# Patient Record
Sex: Male | Born: 1955 | Race: White | Hispanic: No | Marital: Married | Smoking: Never smoker
Health system: Southern US, Community
[De-identification: ages and names within clinical notes are randomized; demographics above are authoritative.]

## PROBLEM LIST (undated history)

## (undated) DIAGNOSIS — Z95828 Presence of other vascular implants and grafts: Secondary | ICD-10-CM

## (undated) DIAGNOSIS — R011 Cardiac murmur, unspecified: Secondary | ICD-10-CM

## (undated) DIAGNOSIS — E785 Hyperlipidemia, unspecified: Secondary | ICD-10-CM

## (undated) DIAGNOSIS — I10 Essential (primary) hypertension: Secondary | ICD-10-CM

## (undated) DIAGNOSIS — C801 Malignant (primary) neoplasm, unspecified: Secondary | ICD-10-CM

## (undated) HISTORY — DX: Essential (primary) hypertension: I10

## (undated) HISTORY — DX: Cardiac murmur, unspecified: R01.1

## (undated) HISTORY — DX: Hyperlipidemia, unspecified: E78.5

---

## 2004-01-03 ENCOUNTER — Encounter: Admission: RE | Admit: 2004-01-03 | Discharge: 2004-01-03 | Payer: Self-pay | Admitting: General Surgery

## 2011-10-28 ENCOUNTER — Other Ambulatory Visit: Payer: Self-pay | Admitting: Physician Assistant

## 2012-06-06 ENCOUNTER — Other Ambulatory Visit: Payer: Self-pay | Admitting: Physician Assistant

## 2012-06-06 NOTE — Telephone Encounter (Signed)
Please pull paper chart.  

## 2012-06-07 NOTE — Telephone Encounter (Signed)
No paper chart °

## 2015-08-31 ENCOUNTER — Encounter: Payer: Self-pay | Admitting: Family

## 2019-10-11 NOTE — Progress Notes (Signed)
Patient referred by Berkley Harvey, NP for murmur and shortness of breath  Subjective:   Donald Wade, male    DOB: [birth year], 64 y.o.   MRN: [id number]   Chief Complaint  Patient presents with  . Shortness of Breath  . New Patient (Initial Visit)     HPI  64 y.o. Caucasian male with hypertension, referred for evaluation of murmur or shortness of breath.  Patient has had progressive symptoms of exertional dyspnea since summer 2020.  He owns 1/2 acre land, which she takes care of himself.  He started experiencing the symptoms while working in the yard in summer 2020, and have progressed since.  He denies any chest pain or chest tightness.  He does endorse exertional dyspnea symptoms.  He has been on Tribenzor, along with additional amlodipine for his blood pressure control.  Amlodipine was recently stopped due to low blood pressure.  Evidence, his blood pressure has been ranging greater than 446 systolic.  On a separate note, patient also complains of pain in his left calf on exertion, improved with rest.  He recently underwent ultrasound of his lower extremity vein that did not show any DVT.  He is lifetime non-smoker, nondiabetic.  He does have family history of pulmonary artery disease, with his maternal grandfather dying at the age of 15.  His mother has had atrial fibrillation.  During the visit today, while discussing possible coronary artery disease and peripheral artery disease, patient suddenly felt overwhelmed and lightheaded.  Symptoms improved after laying him supine for 5 minutes.  Blood pressure normalized.   Past Medical History:  Diagnosis Date  . Heart murmur   . Hyperlipidemia   . Hypertension      Past Surgical History:  Procedure Laterality Date  . APPENDECTOMY  1980     Social History   Tobacco Use  Smoking Status Never Smoker  Smokeless Tobacco Never Used    Social History   Substance and Sexual Activity  Alcohol Use Yes   Comment: 2  beers daily     Family History  Problem Relation Age of Onset  . Heart disease Mother   . Atrial fibrillation Mother      Current Outpatient Medications on File Prior to Visit  Medication Sig Dispense Refill  . aspirin 81 MG EC tablet Take 1 tablet by mouth daily.    Marland Kitchen atorvastatin (LIPITOR) 10 MG tablet Take 1 tablet by mouth daily.    Marland Kitchen buPROPion (WELLBUTRIN XL) 150 MG 24 hr tablet Take 1 tablet by mouth daily.    Marland Kitchen EPINEPHrine 0.3 mg/0.3 mL IJ SOAJ injection Inject into the skin.    . fexofenadine (ALLEGRA) 180 MG tablet Take by mouth daily as needed.    . Multiple Vitamin (MULTI-VITAMIN) tablet Take 1 tablet by mouth daily.    . niacin 100 MG tablet Take 1 tablet by mouth daily as needed.    . Olmesartan-amLODIPine-HCTZ 40-5-12.5 MG TABS Take 1 tablet by mouth daily.    . Omega-3 1000 MG CAPS Take 1 tablet by mouth.    . vitamin B-12 (CYANOCOBALAMIN) 1000 MCG tablet Take 1,000 mcg by mouth daily.     No current facility-administered medications on file prior to visit.    Cardiovascular and other pertinent studies:  EKG: 10/01/2019: Sinus rhythm 80 bpm.  Normal EKG.  Recent labs: 10/01/2019: Glucose 120, BUN/Cr 12/0.92. EGFR 89. Na/K 135/4.3. Rest of the CMP normal  10/01/2019: HbA1C 5.4% Chol 244, TG 312, HDL 65, LDL  117  06/28/2019: H/H 14.5/41.5. MCV 94.6. Platelets 249   Review of Systems  Cardiovascular: Positive for claudication and dyspnea on exertion. Negative for chest pain, leg swelling, palpitations and syncope.        Vitals:   10/13/19 0859 10/13/19 0903  BP: (!) 161/92 (!) 153/93  Pulse: 95 98  Temp: 98.4 F (36.9 C)   SpO2: 99%     Body mass index is 28.26 kg/m. Filed Weights   10/13/19 0859  Weight: 208 lb 6.4 oz (94.5 kg)    Objective:   Physical Exam  Constitutional: He appears well-developed and well-nourished.  Neck: No JVD present.  Cardiovascular: Normal rate, regular rhythm and intact distal pulses.  Murmur heard.   Harsh midsystolic murmur is present with a grade of 2/6 at the upper right sternal border radiating to the neck. Pulses:      Dorsalis pedis pulses are 2+ on the right side and 1+ on the left side.       Posterior tibial pulses are 0 on the right side and 0 on the left side.  Pulmonary/Chest: Effort normal and breath sounds normal. He has no wheezes. He has no rales.  Musculoskeletal:        General: No edema.  Nursing note and vitals reviewed.        Assessment & Recommendations:   64 y.o. Caucasian male with hypertension, exertional dyspnea, claudication.  Exertional dyspnea: Physical exam unremarkable for heart failure.  He does have murmur, likely mild to moderate aortic stenosis, but does not explain his symptoms.  Symptoms could be angina equivalent.  I will obtain Lexiscan/walking stress test.  In addition, I will also obtain calcium score for risk stratification.  Hypertension: Continue Tribenzor.  Instead of additional amlodipine, I added metoprolol succinate 25 mg daily.  This could also work as antianginal agent.  Continue aspirin, Lipitor.  Claudication: Continue risk factor modification.  Will check baseline ABI.  Further recommendations after above testing    Thank you for referring the patient to [place]. Please feel free to contact with any questions.  Nigel Mormon, MD [facility] Pager: [phone] Office: [phone]

## 2019-10-13 ENCOUNTER — Other Ambulatory Visit: Payer: Self-pay

## 2019-10-13 ENCOUNTER — Encounter: Payer: Self-pay | Admitting: Cardiology

## 2019-10-13 ENCOUNTER — Ambulatory Visit: Payer: 59 | Admitting: Cardiology

## 2019-10-13 VITALS — BP 160/92 | HR 86 | Temp 98.4°F | Ht 72.0 in | Wt 208.4 lb

## 2019-10-13 DIAGNOSIS — Z8249 Family history of ischemic heart disease and other diseases of the circulatory system: Secondary | ICD-10-CM | POA: Diagnosis not present

## 2019-10-13 DIAGNOSIS — R0609 Other forms of dyspnea: Secondary | ICD-10-CM

## 2019-10-13 DIAGNOSIS — I739 Peripheral vascular disease, unspecified: Secondary | ICD-10-CM | POA: Insufficient documentation

## 2019-10-13 DIAGNOSIS — I10 Essential (primary) hypertension: Secondary | ICD-10-CM

## 2019-10-13 MED ORDER — METOPROLOL SUCCINATE ER 25 MG PO TB24: 25.0000 mg | ORAL_TABLET | Freq: Every day | ORAL | 1 refills | Status: DC

## 2019-10-19 ENCOUNTER — Ambulatory Visit: Payer: 59

## 2019-10-19 ENCOUNTER — Other Ambulatory Visit: Payer: Self-pay

## 2019-10-19 DIAGNOSIS — I739 Peripheral vascular disease, unspecified: Secondary | ICD-10-CM

## 2019-10-19 DIAGNOSIS — I10 Essential (primary) hypertension: Secondary | ICD-10-CM

## 2019-10-20 ENCOUNTER — Ambulatory Visit: Payer: 59

## 2019-10-20 ENCOUNTER — Other Ambulatory Visit: Payer: Self-pay

## 2019-10-20 DIAGNOSIS — R0609 Other forms of dyspnea: Secondary | ICD-10-CM

## 2019-11-12 ENCOUNTER — Other Ambulatory Visit: Payer: Self-pay

## 2019-11-12 ENCOUNTER — Telehealth: Payer: 59 | Admitting: Cardiology

## 2019-11-12 VITALS — BP 130/80 | HR 80

## 2019-11-12 DIAGNOSIS — R931 Abnormal findings on diagnostic imaging of heart and coronary circulation: Secondary | ICD-10-CM

## 2019-11-12 DIAGNOSIS — I10 Essential (primary) hypertension: Secondary | ICD-10-CM

## 2019-11-12 DIAGNOSIS — I739 Peripheral vascular disease, unspecified: Secondary | ICD-10-CM

## 2019-11-12 DIAGNOSIS — Z8249 Family history of ischemic heart disease and other diseases of the circulatory system: Secondary | ICD-10-CM

## 2019-11-12 DIAGNOSIS — R0609 Other forms of dyspnea: Secondary | ICD-10-CM

## 2019-11-12 MED ORDER — ATORVASTATIN CALCIUM 40 MG PO TABS: 40.0000 mg | ORAL_TABLET | Freq: Every day | ORAL | 3 refills | Status: DC

## 2019-11-12 NOTE — Progress Notes (Addendum)
Patient referred by Berkley Harvey, NP for murmur and shortness of breath  Subjective:   Donald Wade, male    DOB: February 24, 1956, 64 y.o.   MRN: [id number]   I connected with the patient on 11/12/2019 by a video enabled telemedicine application and verified that I am speaking with the correct person using two identifiers.     I discussed the limitations of evaluation and management by telemedicine and the availability of in person appointments. The patient expressed understanding and agreed to proceed.   This visit type was conducted due to national recommendations for restrictions regarding the COVID-19 Pandemic (e.g. social distancing).  This format is felt to be most appropriate for this patient at this time.  All issues noted in this document were discussed and addressed.  No physical exam was performed (except for noted visual exam findings with Tele health visits).  The patient has consented to conduct a Tele health visit and understands insurance will be billed.    Chief Complaint  Patient presents with  . Shortness of Breath     HPI  64 y.o. Caucasian male with hypertension, exertional dyspnea, claudication.  Work-up showed normal EF, mild AI, mild TR, RVSP 28 mmHg.  Mildly decreased perfusion in both lower extremities, ABI 0.8 on the right, 0.88 on left.  CT cardiac scoring showed elevated calcium score of 385, 37 left main, 346 in LAD, 2 in RCA, 0 in left circumflex.  Surprisingly, his nuclear stress test did not show any ischemia.  Patient continues to have significant shortness of breath with more than usual activity. He loves to work outside in the yard. However, his ability to do so is limited due to his shortness of breath. Again, he denies any chest pain.  Initial consultation HPI 10/13/2019: Patient has had progressive symptoms of exertional dyspnea since summer 2020.  He owns 1/2 acre land, which he takes care of himself.  He started experiencing the symptoms while  working in the yard in summer 2020, and have progressed since.  He denies any chest pain or chest tightness.  He does endorse exertional dyspnea symptoms.  He has been on Tribenzor, along with additional amlodipine for his blood pressure control.  Amlodipine was recently stopped due to low blood pressure.  Evidence, his blood pressure has been ranging greater than 165 systolic.  On a separate note, patient also complains of pain in his left calf on exertion, improved with rest.  He recently underwent ultrasound of his lower extremity vein that did not show any DVT.  He is lifetime non-smoker, nondiabetic.  He does have family history of pulmonary artery disease, with his maternal grandfather dying at the age of 63.  His mother has had atrial fibrillation.  During the visit today, while discussing possible coronary artery disease and peripheral artery disease, patient suddenly felt overwhelmed and lightheaded.  Symptoms improved after laying him supine for 5 minutes.  Blood pressure normalized.    Current Outpatient Medications on File Prior to Visit  Medication Sig Dispense Refill  . aspirin 81 MG EC tablet Take 1 tablet by mouth daily.    Marland Kitchen atorvastatin (LIPITOR) 10 MG tablet Take 1 tablet by mouth daily.    Marland Kitchen buPROPion (WELLBUTRIN XL) 150 MG 24 hr tablet Take 1 tablet by mouth daily.    Marland Kitchen EPINEPHrine 0.3 mg/0.3 mL IJ SOAJ injection Inject into the skin.    . fexofenadine (ALLEGRA) 180 MG tablet Take by mouth daily as needed.    Marland Kitchen  metoprolol succinate (TOPROL XL) 25 MG 24 hr tablet Take 1 tablet (25 mg total) by mouth daily. 30 tablet 1  . Multiple Vitamin (MULTI-VITAMIN) tablet Take 1 tablet by mouth daily.    . niacin 100 MG tablet Take 1 tablet by mouth daily as needed.    . Olmesartan-amLODIPine-HCTZ 40-5-12.5 MG TABS Take 1 tablet by mouth daily.    . Omega-3 1000 MG CAPS Take 1 tablet by mouth.    . vitamin B-12 (CYANOCOBALAMIN) 1000 MCG tablet Take 1,000 mcg by mouth daily.     No  current facility-administered medications on file prior to visit.    Cardiovascular and other pertinent studies:  Lexiscan (Walking with Jaci Carrel) Sestamibi Stress Test 10/20/2019: Nondiagnostic ECG stress. LV is minimally dilated with LV end diastolic volume of 509 mL.  Myocardial perfusion is normal. Stress LV EF: 50%.  No previous exam available for comparison. Low risk study.   Echocardiogram 10/19/2019:  Normal left ventricular systolic function, LVEF 55 to 60%.  Moderate concentric hypertrophy.  No obvious regional wall motion abnormalities.  Normal diastolic filling pressures.  Left atrial dilatation.  Mild aortic regurgitation.  Mild tricuspid regurgitation.  No pulmonary hypertension, estimated RVSP 28 mmHg.  CT Cardiac scoring 10/19/2019: - Total Calcium Score: 385. -- Left Main: 37. -- Right Coronary: 2 -- Left Anterior Descending: 346. -- Circumflex: 0.  ABI 10/12/2019:  This exam reveals mildly decreased perfusion of the right lower extremity,  noted at the anterior tibial and post tibial artery level (ABI 0.80) with  moderately abnormal waveform at right ankle). This exam reveals mildly  decreased perfusion of the left lower extremity, noted at the post tibial  artery level (ABI 0.88) with mildly abnormal waveform at left ankle.   EKG: 10/01/2019: Sinus rhythm 80 bpm.  Normal EKG.  Recent labs: 10/01/2019: Glucose 120, BUN/Cr 12/0.92. EGFR 89. Na/K 135/4.3. Rest of the CMP normal  10/01/2019: HbA1C 5.4% Chol 244, TG 312, HDL 65, LDL 117  06/28/2019: H/H 14.5/41.5. MCV 94.6. Platelets 249   Review of Systems  Cardiovascular: Positive for claudication and dyspnea on exertion. Negative for chest pain, leg swelling, palpitations and syncope.        Vitals:   11/12/19 1512  BP: 130/80  Pulse: 80     Objective:   Physical Exam  Constitutional: He is oriented to person, place, and time. He appears well-developed and well-nourished. No distress.    Pulmonary/Chest: Effort normal.  Neurological: He is alert and oriented to person, place, and time.  Psychiatric: He has a normal mood and affect.  Nursing note and vitals reviewed.        Assessment & Recommendations:   63 y.o. Caucasian male with hypertension, exertional dyspnea, claudication.  Exertional dyspnea: Echocardiogram with normal EF. Mild AI, mild TR. Stress test did not show ischemia. However, he did have hypotensive response on exercise. He has significant calcium in left main (37) and LAD (346). His symptoms are still concerning for angina equivalent. His symptoms have remained persistent on medical therapy. Given high clinical suspicion, I recommend coronary angiogram, with possibility of intervention.  Schedule for cardiac catheterization, and possible angioplasty. We discussed regarding risks, benefits, alternatives to this including stress testing, CTA and continued medical therapy. Patient wants to proceed. Understands <1-2% risk of death, stroke, MI, urgent CABG, bleeding, infection, renal failure but not limited to these.  Continue current medical treatment with Aspirin, increase lipitor to 40 mg. Continue Tribenzor, metoprolol.   Hypertension: Well controlled.  Claudication:  Mildly reduced ABI. Continue aggressive risk factor modification as above.   Nigel Mormon, MD [facility] Pager: [phone] Office: [phone]

## 2019-11-18 LAB — CBC
Hematocrit: 39 % (ref 37.5–51.0)
Hemoglobin: 13.5 g/dL (ref 13.0–17.7)
MCH: 33.3 pg — ABNORMAL HIGH (ref 26.6–33.0)
MCHC: 34.6 g/dL (ref 31.5–35.7)
MCV: 96 fL (ref 79–97)
Platelets: 259 10*3/uL (ref 150–450)
RBC: 4.05 x10E6/uL — ABNORMAL LOW (ref 4.14–5.80)
RDW: 12 % (ref 11.6–15.4)
WBC: 6.5 10*3/uL (ref 3.4–10.8)

## 2019-11-19 ENCOUNTER — Other Ambulatory Visit (HOSPITAL_COMMUNITY)
Admission: RE | Admit: 2019-11-19 | Discharge: 2019-11-19 | Disposition: A | Discharge status: Home / Self Care | Payer: 59 | Source: Ambulatory Visit | Attending: Cardiology | Admitting: Cardiology

## 2019-11-19 DIAGNOSIS — Z01812 Encounter for preprocedural laboratory examination: Secondary | ICD-10-CM | POA: Insufficient documentation

## 2019-11-19 DIAGNOSIS — Z20822 Contact with and (suspected) exposure to covid-19: Secondary | ICD-10-CM | POA: Diagnosis not present

## 2019-11-19 LAB — SARS CORONAVIRUS 2 (TAT 6-24 HRS): SARS Coronavirus 2: NEGATIVE

## 2019-11-23 ENCOUNTER — Other Ambulatory Visit: Payer: Self-pay

## 2019-11-23 ENCOUNTER — Ambulatory Visit (HOSPITAL_COMMUNITY)
Admission: RE | Admit: 2019-11-23 | Discharge: 2019-11-23 | Disposition: A | Discharge status: Home / Self Care | Payer: No Typology Code available for payment source | Attending: Cardiology | Admitting: Cardiology

## 2019-11-23 ENCOUNTER — Encounter (HOSPITAL_COMMUNITY)
Admission: RE | Disposition: A | Discharge status: Home / Self Care | Payer: Self-pay | Source: Home / Self Care | Attending: Cardiology

## 2019-11-23 DIAGNOSIS — I739 Peripheral vascular disease, unspecified: Secondary | ICD-10-CM | POA: Diagnosis not present

## 2019-11-23 DIAGNOSIS — I10 Essential (primary) hypertension: Secondary | ICD-10-CM | POA: Diagnosis not present

## 2019-11-23 DIAGNOSIS — Z79899 Other long term (current) drug therapy: Secondary | ICD-10-CM | POA: Insufficient documentation

## 2019-11-23 DIAGNOSIS — Z7982 Long term (current) use of aspirin: Secondary | ICD-10-CM | POA: Diagnosis not present

## 2019-11-23 DIAGNOSIS — R0609 Other forms of dyspnea: Secondary | ICD-10-CM | POA: Diagnosis not present

## 2019-11-23 DIAGNOSIS — R931 Abnormal findings on diagnostic imaging of heart and coronary circulation: Secondary | ICD-10-CM | POA: Diagnosis present

## 2019-11-23 HISTORY — PX: LEFT HEART CATH AND CORONARY ANGIOGRAPHY: CATH118249

## 2019-11-23 LAB — BASIC METABOLIC PANEL
Anion gap: 14 (ref 5–15)
BUN: 10 mg/dL (ref 8–23)
CO2: 24 mmol/L (ref 22–32)
Calcium: 9.5 mg/dL (ref 8.9–10.3)
Chloride: 96 mmol/L — ABNORMAL LOW (ref 98–111)
Creatinine, Ser: 0.87 mg/dL (ref 0.61–1.24)
GFR calc Af Amer: >60 mL/min (ref >60)
GFR calc non Af Amer: >60 mL/min (ref >60)
Glucose, Bld: 106 mg/dL — ABNORMAL HIGH (ref 70–99)
Potassium: 3.7 mmol/L (ref 3.5–5.1)
Sodium: 134 mmol/L — ABNORMAL LOW (ref 135–145)

## 2019-11-23 SURGERY — LEFT HEART CATH AND CORONARY ANGIOGRAPHY
Anesthesia: LOCAL

## 2019-11-23 MED ORDER — HEPARIN (PORCINE) IN NACL 1000-0.9 UT/500ML-% IV SOLN
INTRAVENOUS | Status: DC | PRN
  Administered 2019-11-23 (×2): 500 mL

## 2019-11-23 MED ORDER — SODIUM CHLORIDE 0.9 % IV SOLN: 250.0000 mL | INTRAVENOUS | Status: DC | PRN

## 2019-11-23 MED ORDER — SODIUM CHLORIDE 0.9% FLUSH: 3.0000 mL | Freq: Two times a day (BID) | INTRAVENOUS | Status: DC

## 2019-11-23 MED ORDER — LABETALOL HCL 5 MG/ML IV SOLN: 10.0000 mg | INTRAVENOUS | Status: DC | PRN

## 2019-11-23 MED ORDER — SODIUM CHLORIDE 0.9 % WEIGHT BASED INFUSION: 1.0000 mL/kg/h | INTRAVENOUS | Status: DC

## 2019-11-23 MED ORDER — LIDOCAINE HCL (PF) 1 % IJ SOLN
INTRAMUSCULAR | Status: DC | PRN
  Administered 2019-11-23: 2 mL

## 2019-11-23 MED ORDER — SODIUM CHLORIDE 0.9 % IV SOLN: INTRAVENOUS | Status: AC

## 2019-11-23 MED ORDER — HEPARIN SODIUM (PORCINE) 1000 UNIT/ML IJ SOLN
INTRAMUSCULAR | Status: DC | PRN
  Administered 2019-11-23: 5000 [IU] via INTRAVENOUS

## 2019-11-23 MED ORDER — FENTANYL CITRATE (PF) 100 MCG/2ML IJ SOLN
INTRAMUSCULAR | Status: DC | PRN
  Administered 2019-11-23: 50 ug via INTRAVENOUS

## 2019-11-23 MED ORDER — SODIUM CHLORIDE 0.9% FLUSH: 3.0000 mL | INTRAVENOUS | Status: DC | PRN

## 2019-11-23 MED ORDER — VERAPAMIL HCL 2.5 MG/ML IV SOLN
INTRAVENOUS | Status: AC
  Filled 2019-11-23: qty 2

## 2019-11-23 MED ORDER — HEPARIN (PORCINE) IN NACL 1000-0.9 UT/500ML-% IV SOLN
INTRAVENOUS | Status: AC
  Filled 2019-11-23: qty 1000

## 2019-11-23 MED ORDER — MIDAZOLAM HCL 2 MG/2ML IJ SOLN
INTRAMUSCULAR | Status: AC
  Filled 2019-11-23: qty 2

## 2019-11-23 MED ORDER — MIDAZOLAM HCL 2 MG/2ML IJ SOLN
INTRAMUSCULAR | Status: DC | PRN
  Administered 2019-11-23: 2 mg via INTRAVENOUS

## 2019-11-23 MED ORDER — LIDOCAINE HCL (PF) 1 % IJ SOLN
INTRAMUSCULAR | Status: AC
  Filled 2019-11-23: qty 30

## 2019-11-23 MED ORDER — ASPIRIN 81 MG PO CHEW: 81.0000 mg | CHEWABLE_TABLET | ORAL | Status: DC

## 2019-11-23 MED ORDER — ONDANSETRON HCL 4 MG/2ML IJ SOLN: 4.0000 mg | Freq: Four times a day (QID) | INTRAMUSCULAR | Status: DC | PRN

## 2019-11-23 MED ORDER — HEPARIN SODIUM (PORCINE) 1000 UNIT/ML IJ SOLN
INTRAMUSCULAR | Status: AC
  Filled 2019-11-23: qty 1

## 2019-11-23 MED ORDER — SODIUM CHLORIDE 0.9 % WEIGHT BASED INFUSION
3.0000 mL/kg/h | INTRAVENOUS | Status: AC
  Administered 2019-11-23: 285 mL/h via INTRAVENOUS

## 2019-11-23 MED ORDER — HYDRALAZINE HCL 20 MG/ML IJ SOLN: 10.0000 mg | INTRAMUSCULAR | Status: DC | PRN

## 2019-11-23 MED ORDER — ACETAMINOPHEN 325 MG PO TABS: 650.0000 mg | ORAL_TABLET | ORAL | Status: DC | PRN

## 2019-11-23 MED ORDER — FENTANYL CITRATE (PF) 100 MCG/2ML IJ SOLN
INTRAMUSCULAR | Status: AC
  Filled 2019-11-23: qty 2

## 2019-11-23 MED ORDER — VERAPAMIL HCL 2.5 MG/ML IV SOLN
INTRAVENOUS | Status: DC | PRN
  Administered 2019-11-23: 10 mL via INTRA_ARTERIAL

## 2019-11-23 MED ORDER — IOHEXOL 350 MG/ML SOLN
INTRAVENOUS | Status: DC | PRN
  Administered 2019-11-23: 30 mL

## 2019-11-23 SURGICAL SUPPLY — 10 items
CATH INFINITI JR4 5F (CATHETERS) ×1 IMPLANT
CATH OPTITORQUE TIG 4.0 5F (CATHETERS) ×1 IMPLANT
DEVICE RAD COMP TR BAND LRG (VASCULAR PRODUCTS) ×1 IMPLANT
GLIDESHEATH SLEND SS 6F .021 (SHEATH) ×1 IMPLANT
GUIDEWIRE INQWIRE 1.5J.035X260 (WIRE) IMPLANT
INQWIRE 1.5J .035X260CM (WIRE) ×2
KIT HEART LEFT (KITS) ×2 IMPLANT
PACK CARDIAC CATHETERIZATION (CUSTOM PROCEDURE TRAY) ×2 IMPLANT
TRANSDUCER W/STOPCOCK (MISCELLANEOUS) ×2 IMPLANT
TUBING CIL FLEX 10 FLL-RA (TUBING) ×2 IMPLANT

## 2019-11-23 NOTE — Interval H&P Note (Signed)
History and Physical Interval Note:  11/23/2019 2:57 PM  Donald Wade  has presented today for surgery, with the diagnosis of shortness of breath.  The various methods of treatment have been discussed with the patient and family. After consideration of risks, benefits and other options for treatment, the patient has consented to  Procedure(s): LEFT HEART CATH AND CORONARY ANGIOGRAPHY (N/A) as a surgical intervention.  The patient's history has been reviewed, patient examined, no change in status, stable for surgery.  I have reviewed the patient's chart and labs.  Questions were answered to the patient's satisfaction.    2016/2017 Appropriate Use Criteria for Coronary Revascularization Clinical Presentation: Diabetes Mellitus? Symptom Status? S/P CABG? Antianginal Therapy (# of long-acting drugs)? Results of Non-invasive testing? FFR/iFR results in all diseased vessels? Patient undergoing renal transplant? Patient undergoing percutaneous valve procedure (TAVR, MitraClip, Others)? Symptom Status:  Ischemic Symptoms  Non-invasive Testing:  High risk  If no or indeterminate stress test, FFR/iFR results in all diseased vessels:  N/A  Diabetes Mellitus:  No  S/P CABG:  No  Antianginal therapy (number of long-acting drugs):  >=2  Patient undergoing renal transplant:  No  Patient undergoing percutaneous valve procedure:  No    newline 1 Vessel Disease PCI CABG  No proximal LAD involvement, No proximal left dominant LCX involvement A (8); Indication 2 M (6); Indication 2   Proximal left dominant LCX involvement A (8); Indication 5 A (8); Indication 5   Proximal LAD involvement A (8); Indication 5 A (8); Indication 5   newline 2 Vessel Disease  No proximal LAD involvement A (8); Indication 8 A (7); Indication 8   Proximal LAD involvement A (8); Indication 11 A (8); Indication 11   newline 3 Vessel Disease  Low disease complexity (e.g., focal stenoses, SYNTAX <=22) A (8); Indication 17 A  (8); Indication 17   Intermediate or high disease complexity (e.g., SYNTAX >=23) M (6); Indication 21 A (9); Indication 21   newline Left Main Disease  Isolated LMCA disease: ostial or midshaft A (7); Indication 24 A (9); Indication 24   Isolated LMCA disease: bifurcation involvement M (6); Indication 25 A (9); Indication 25   LMCA ostial or midshaft, concurrent low disease burden multivessel disease (e.g., 1-2 additional focal stenoses, SYNTAX <=22) A (7); Indication 26 A (9); Indication 26   LMCA ostial or midshaft, concurrent intermediate or high disease burden multivessel disease (e.g., 1-2 additional bifurcation stenoses, long stenoses, SYNTAX >=23) M (4); Indication 27 A (9); Indication 27   LMCA bifurcation involvement, concurrent low disease burden multivessel disease (e.g., 1-2 additional focal stenoses, SYNTAX <=22) M (6); Indication 28 A (9); Indication 28   LMCA bifurcation involvement, concurrent intermediate or high disease burden multivessel disease (e.g., 1-2 additional bifurcation stenoses, long stenoses, SYNTAX >=23) R (3); Indication 29 A (9); Indication [place]

## 2019-11-23 NOTE — Discharge Instructions (Signed)
Drink plenty of fluids for few days, Keep right arm elevated above level of heart Radial Site Care  This sheet gives you information about how to care for yourself after your procedure. Your health care provider may also give you more specific instructions. If you have problems or questions, contact your health care provider. What can I expect after the procedure? After the procedure, it is common to have:  Bruising and tenderness at the catheter insertion area. Follow these instructions at home: Medicines  Take over-the-counter and prescription medicines only as told by your health care provider. Insertion site care  Follow instructions from your health care provider about how to take care of your insertion site. Make sure you: ? Wash your hands with soap and water before you change your bandage (dressing). If soap and water are not available, use hand sanitizer. ? Change your dressing as told by your health care provider. ? Leave stitches (sutures), skin glue, or adhesive strips in place. These skin closures may need to stay in place for 2 weeks or longer. If adhesive strip edges start to loosen and curl up, you may trim the loose edges. Do not remove adhesive strips completely unless your health care provider tells you to do that.  Check your insertion site every day for signs of infection. Check for: ? Redness, swelling, or pain. ? Fluid or blood. ? Pus or a bad smell. ? Warmth.  Do not take baths, swim, or use a hot tub until your health care provider approves.  You may shower 24-48 hours after the procedure, or as directed by your health care provider. ? Remove the dressing and gently wash the site with plain soap and water. ? Pat the area dry with a clean towel. ? Do not rub the site. That could cause bleeding.  Do not apply powder or lotion to the site. Activity   For 24 hours after the procedure, or as directed by your health care provider: ? Do not flex or bend the  affected arm. ? Do not push or pull heavy objects with the affected arm. ? Do not drive yourself home from the hospital or clinic. You may drive 24 hours after the procedure unless your health care provider tells you not to. ? Do not operate machinery or power tools.  Do not lift anything that is heavier than 10 lb (4.5 kg), or the limit that you are told, until your health care provider says that it is safe.  Ask your health care provider when it is okay to: ? Return to work or school. ? Resume usual physical activities or sports. ? Resume sexual activity. General instructions  If the catheter site starts to bleed, raise your arm and put firm pressure on the site. If the bleeding does not stop, get help right away. This is a medical emergency.  If you went home on the same day as your procedure, a responsible adult should be with you for the first 24 hours after you arrive home.  Keep all follow-up visits as told by your health care provider. This is important. Contact a health care provider if:  You have a fever.  You have redness, swelling, or yellow drainage around your insertion site. Get help right away if:  You have unusual pain at the radial site.  The catheter insertion area swells very fast.  The insertion area is bleeding, and the bleeding does not stop when you hold steady pressure on the area.  Your arm  or hand becomes pale, cool, tingly, or numb. These symptoms may represent a serious problem that is an emergency. Do not wait to see if the symptoms will go away. Get medical help right away. Call your local emergency services (911 in the [place] Do not drive yourself to the hospital. Summary  After the procedure, it is common to have bruising and tenderness at the site.  Follow instructions from your health care provider about how to take care of your radial site wound. Check the wound every day for signs of infection.  Do not lift anything that is heavier than 10  lb (4.5 kg), or the limit that you are told, until your health care provider says that it is safe. This information is not intended to replace advice given to you by your health care provider. Make sure you discuss any questions you have with your health care provider. Document Revised: 10/01/2017 Document Reviewed: 10/01/2017 Elsevier Patient Education  2020 [employer].

## 2019-11-23 NOTE — H&P (Signed)
Patient referred by No ref. provider found for murmur and shortness of breath  Subjective:   Donald Wade, male    DOB: 12-Jun-1956, 64 y.o.   MRN: [id number]  Chief complaint: Exertional dyspnea  HPI  64 y.o. Caucasian male with hypertension, exertional dyspnea, claudication.  Work-up showed normal EF, mild AI, mild TR, RVSP 28 mmHg.  Mildly decreased perfusion in both lower extremities, ABI 0.8 on the right, 0.88 on left.  CT cardiac scoring showed elevated calcium score of 385, 37 left main, 346 in LAD, 2 in RCA, 0 in left circumflex.  Surprisingly, his nuclear stress test did not show any ischemia.  Patient continues to have significant shortness of breath with more than usual activity. He loves to work outside in the yard. However, his ability to do so is limited due to his shortness of breath. Again, he denies any chest pain.  Initial consultation HPI 10/13/2019: Patient has had progressive symptoms of exertional dyspnea since summer 2020.  He owns 1/2 acre land, which he takes care of himself.  He started experiencing the symptoms while working in the yard in summer 2020, and have progressed since.  He denies any chest pain or chest tightness.  He does endorse exertional dyspnea symptoms.  He has been on Tribenzor, along with additional amlodipine for his blood pressure control.  Amlodipine was recently stopped due to low blood pressure.  Evidence, his blood pressure has been ranging greater than 676 systolic.  On a separate note, patient also complains of pain in his left calf on exertion, improved with rest.  He recently underwent ultrasound of his lower extremity vein that did not show any DVT.  He is lifetime non-smoker, nondiabetic.  He does have family history of pulmonary artery disease, with his maternal grandfather dying at the age of 30.  His mother has had atrial fibrillation.  During the visit today, while discussing possible coronary artery disease and peripheral  artery disease, patient suddenly felt overwhelmed and lightheaded.  Symptoms improved after laying him supine for 5 minutes.  Blood pressure normalized.    No current facility-administered medications on file prior to encounter.   Current Outpatient Medications on File Prior to Encounter  Medication Sig Dispense Refill  . aspirin 81 MG EC tablet Take 1 tablet by mouth daily.    Marland Kitchen atorvastatin (LIPITOR) 40 MG tablet Take 1 tablet (40 mg total) by mouth daily. 90 tablet 3  . buPROPion (WELLBUTRIN XL) 150 MG 24 hr tablet Take 150 mg by mouth daily.     Marland Kitchen EPINEPHrine 0.3 mg/0.3 mL IJ SOAJ injection Inject 0.3 mg into the skin as needed for anaphylaxis.     . fexofenadine (ALLEGRA) 180 MG tablet Take 180 mg by mouth daily as needed.     . metoprolol succinate (TOPROL XL) 25 MG 24 hr tablet Take 1 tablet (25 mg total) by mouth daily. 30 tablet 1  . Multiple Vitamin (MULTI-VITAMIN) tablet Take 1 tablet by mouth daily.    . niacin 100 MG tablet Take 100 mg by mouth daily as needed (acne).     . Olmesartan-amLODIPine-HCTZ 40-5-12.5 MG TABS Take 1 tablet by mouth daily.    . Omega-3 1000 MG CAPS Take 1,000 mg by mouth 3 (three) times a week.     . vitamin B-12 (CYANOCOBALAMIN) 1000 MCG tablet Take 1,000 mcg by mouth daily.      Cardiovascular and other pertinent studies:  Lexiscan (Walking with Jaci Carrel) Sestamibi Stress Test 10/20/2019:  Nondiagnostic ECG stress. LV is minimally dilated with LV end diastolic volume of 729 mL.  Myocardial perfusion is normal. Stress LV EF: 50%.  No previous exam available for comparison. Low risk study.   Echocardiogram 10/19/2019:  Normal left ventricular systolic function, LVEF 55 to 60%.  Moderate concentric hypertrophy.  No obvious regional wall motion abnormalities.  Normal diastolic filling pressures.  Left atrial dilatation.  Mild aortic regurgitation.  Mild tricuspid regurgitation.  No pulmonary hypertension, estimated RVSP 28 mmHg.  CT Cardiac  scoring 10/19/2019: - Total Calcium Score: 385. -- Left Main: 37. -- Right Coronary: 2 -- Left Anterior Descending: 346. -- Circumflex: 0.  ABI 10/12/2019:  This exam reveals mildly decreased perfusion of the right lower extremity,  noted at the anterior tibial and post tibial artery level (ABI 0.80) with  moderately abnormal waveform at right ankle). This exam reveals mildly  decreased perfusion of the left lower extremity, noted at the post tibial  artery level (ABI 0.88) with mildly abnormal waveform at left ankle.   EKG: 10/01/2019: Sinus rhythm 80 bpm.  Normal EKG.  Recent labs: 10/01/2019: Glucose 120, BUN/Cr 12/0.92. EGFR 89. Na/K 135/4.3. Rest of the CMP normal  10/01/2019: HbA1C 5.4% Chol 244, TG 312, HDL 65, LDL 117  06/28/2019: H/H 14.5/41.5. MCV 94.6. Platelets 249   Review of Systems  Cardiovascular: Positive for claudication and dyspnea on exertion. Negative for chest pain, leg swelling, palpitations and syncope.        Vitals:   11/23/19 1254  BP: (!) 154/90  Pulse: 85  Temp: 98.2 F (36.8 C)  SpO2: 100%     Objective:   Physical Exam  Constitutional: He is oriented to person, place, and time. He appears well-developed and well-nourished. No distress.  HENT:  Head: Normocephalic and atraumatic.  Eyes: Pupils are equal, round, and reactive to light. Conjunctivae are normal.  Neck: No JVD present.  Cardiovascular: Normal rate, regular rhythm and intact distal pulses.  Pulmonary/Chest: Effort normal and breath sounds normal. He has no wheezes. He has no rales.  Abdominal: Soft. Bowel sounds are normal. There is no rebound.  Musculoskeletal:        General: No edema.  Lymphadenopathy:    He has no cervical adenopathy.  Neurological: He is alert and oriented to person, place, and time. No cranial nerve deficit.  Skin: Skin is warm and dry.  Psychiatric: He has a normal mood and affect.  Nursing note and vitals reviewed.        Assessment &  Recommendations:   64 y.o. Caucasian male with hypertension, exertional dyspnea, claudication.  Exertional dyspnea: Echocardiogram with normal EF. Mild AI, mild TR. Stress test did not show ischemia. However, he did have hypotensive response on exercise. He has significant calcium in left main (37) and LAD (346). His symptoms are still concerning for angina equivalent. His symptoms have remained persistent on medical therapy. Given high clinical suspicion, I recommend coronary angiogram, with possibility of intervention.  Schedule for cardiac catheterization, and possible angioplasty. We discussed regarding risks, benefits, alternatives to this including stress testing, CTA and continued medical therapy. Patient wants to proceed. Understands <1-2% risk of death, stroke, MI, urgent CABG, bleeding, infection, renal failure but not limited to these.  Continue current medical treatment with Aspirin, increase lipitor to 40 mg. Continue Tribenzor, metoprolol.   Hypertension: Well controlled.  Claudication: Mildly reduced ABI. Continue aggressive risk factor modification as above.   Nigel Mormon, MD [facility] [place] Pager: [phone] Office: [phone]

## 2019-12-03 ENCOUNTER — Ambulatory Visit: Payer: 59 | Admitting: Cardiology

## 2019-12-03 ENCOUNTER — Encounter: Payer: Self-pay | Admitting: Cardiology

## 2019-12-03 VITALS — BP 154/78 | HR 97 | Temp 98.0°F | Resp 17 | Ht 72.0 in | Wt 221.0 lb

## 2019-12-03 DIAGNOSIS — I739 Peripheral vascular disease, unspecified: Secondary | ICD-10-CM

## 2019-12-03 DIAGNOSIS — R0609 Other forms of dyspnea: Secondary | ICD-10-CM

## 2019-12-03 DIAGNOSIS — Z8249 Family history of ischemic heart disease and other diseases of the circulatory system: Secondary | ICD-10-CM

## 2019-12-03 DIAGNOSIS — I10 Essential (primary) hypertension: Secondary | ICD-10-CM

## 2019-12-03 DIAGNOSIS — R931 Abnormal findings on diagnostic imaging of heart and coronary circulation: Secondary | ICD-10-CM

## 2019-12-03 MED ORDER — METOPROLOL SUCCINATE ER 25 MG PO TB24: 25.0000 mg | ORAL_TABLET | Freq: Every day | ORAL | 1 refills | Status: DC

## 2019-12-03 MED ORDER — CLOPIDOGREL BISULFATE 75 MG PO TABS: 75.0000 mg | ORAL_TABLET | Freq: Every day | ORAL | 3 refills | Status: DC

## 2019-12-03 MED ORDER — ATORVASTATIN CALCIUM 40 MG PO TABS: 40.0000 mg | ORAL_TABLET | Freq: Every day | ORAL | 3 refills | Status: DC

## 2019-12-03 NOTE — Progress Notes (Signed)
Patient referred by Berkley Harvey, NP for murmur and shortness of breath  Subjective:   Donald Wade, male    DOB: 01-01-1956, 64 y.o.   MRN: [id number]   Chief Complaint  Patient presents with  . Shortness of Breath     HPI  64 y.o. Caucasian male with hypertension, exertional dyspnea, claudication.  Work-up showed normal EF, mild AI, mild TR, RVSP 28 mmHg.  Mildly decreased perfusion in both lower extremities, ABI 0.8 on the right, 0.88 on left.  CT cardiac scoring showed elevated calcium score of 385, 37 left main, 346 in LAD, 2 in RCA, 0 in left circumflex.  Coronary angiogram showed medial calcification without any obstructive coronary artery disease.  LVEDP was normal.  Patient continues to have mild shortness of breath and claudication on physical exertion.  Symptoms resolved immediately after rest.  Blood pressure is elevated today.  However, he checks blood pressure regularly at home at reports that it is always well controlled.   Current Outpatient Medications on File Prior to Visit  Medication Sig Dispense Refill  . aspirin 81 MG EC tablet Take 1 tablet by mouth daily.    Marland Kitchen atorvastatin (LIPITOR) 40 MG tablet Take 1 tablet (40 mg total) by mouth daily. 90 tablet 3  . buPROPion (WELLBUTRIN XL) 150 MG 24 hr tablet Take 150 mg by mouth daily.     Marland Kitchen EPINEPHrine 0.3 mg/0.3 mL IJ SOAJ injection Inject 0.3 mg into the skin as needed for anaphylaxis.     . fexofenadine (ALLEGRA) 180 MG tablet Take 180 mg by mouth daily as needed.     . metoprolol succinate (TOPROL XL) 25 MG 24 hr tablet Take 1 tablet (25 mg total) by mouth daily. 30 tablet 1  . Multiple Vitamin (MULTI-VITAMIN) tablet Take 1 tablet by mouth daily.    . Olmesartan-amLODIPine-HCTZ 40-5-12.5 MG TABS Take 1 tablet by mouth daily.    . Omega-3 1000 MG CAPS Take 1,000 mg by mouth 3 (three) times a week.     . vitamin B-12 (CYANOCOBALAMIN) 1000 MCG tablet Take 1,000 mcg by mouth daily.    . niacin 100 MG  tablet Take 100 mg by mouth daily as needed (acne).      No current facility-administered medications on file prior to visit.    Cardiovascular and other pertinent studies:  EKG 12/03/2019: Sinus rhythm 90 bpm. Normal EKG.  Coronary angiography 11/23/2019: LM: Normal LAD: Medical calcification. No significant stenosis LCx: Normal RCA: Normal LVEDP 1 mmHg  Lexiscan (Walking with mod Bruce) Sestamibi Stress Test 10/20/2019: Nondiagnostic ECG stress. LV is minimally dilated with LV end diastolic volume of 409 mL.  Myocardial perfusion is normal. Stress LV EF: 50%.  No previous exam available for comparison. Low risk study.   Echocardiogram 10/19/2019:  Normal left ventricular systolic function, LVEF 55 to 60%.  Moderate concentric hypertrophy.  No obvious regional wall motion abnormalities.  Normal diastolic filling pressures.  Left atrial dilatation.  Mild aortic regurgitation.  Mild tricuspid regurgitation.  No pulmonary hypertension, estimated RVSP 28 mmHg.  CT Cardiac scoring 10/19/2019: - Total Calcium Score: 385. -- Left Main: 37. -- Right Coronary: 2 -- Left Anterior Descending: 346. -- Circumflex: 0.  ABI 10/12/2019:  This exam reveals mildly decreased perfusion of the right lower extremity,  noted at the anterior tibial and post tibial artery level (ABI 0.80) with  moderately abnormal waveform at right ankle). This exam reveals mildly  decreased perfusion of the left lower extremity,  noted at the post tibial  artery level (ABI 0.88) with mildly abnormal waveform at left ankle.    Recent labs: 10/01/2019: Glucose 120, BUN/Cr 12/0.92. EGFR 89. Na/K 135/4.3. Rest of the CMP normal  10/01/2019: HbA1C 5.4% Chol 244, TG 312, HDL 65, LDL 117  06/28/2019: H/H 14.5/41.5. MCV 94.6. Platelets 249   Review of Systems  Cardiovascular: Positive for claudication and dyspnea on exertion. Negative for chest pain, leg swelling, palpitations and syncope.         Vitals:   12/03/19 1348 12/03/19 1351  BP: (!) 148/82 (!) 154/78  Pulse: 97   Resp: 17   Temp: 98 F (36.7 C)   SpO2: 98%      Objective:   Physical Exam  Constitutional: He is oriented to person, place, and time. He appears well-developed and well-nourished. No distress.  Neck: No JVD present.  Cardiovascular: Normal rate, regular rhythm and normal heart sounds.  No murmur heard. Pulses:      Dorsalis pedis pulses are 2+ on the right side and 0 on the left side.       Posterior tibial pulses are 1+ on the right side and 0 on the left side.  Pulmonary/Chest: Effort normal and breath sounds normal. He has no wheezes. He has no rales.  Musculoskeletal:        General: No edema.  Neurological: He is alert and oriented to person, place, and time.  Psychiatric: He has a normal mood and affect.  Nursing note and vitals reviewed.        Assessment & Recommendations:   64 y.o. Caucasian male with hypertension, exertional dyspnea, claudication.  Exertional dyspnea: Unlikely of cardiac etiology given normal coronaries, normal LVEDP, no evidence of pulmonary hypertension on echocardiogram.  Suspect deconditioning or alternate etiology.  Continue regular physical activity.  PAD: Claudication, abnormal physical exam, and mildly reduced ABI.  Switch aspirin to Plavix for medical management.  Encouraged regular walking.  Continue aggressive blood pressure and lipid control.  Encouraged heart healthy diet.  Hypertension: Elevated blood pressure today.  Needs to continue regular monitoring at home.  I have not made any changes given that his blood pressure at baseline is well controlled.  Follow-up in 3 months.  Nigel Mormon, MD [facility] [place] Pager: [phone] Office: [phone]

## 2020-02-18 LAB — LIPID PANEL
Chol/HDL Ratio: 3.5 ratio (ref 0.0–5.0)
Cholesterol, Total: 162 mg/dL (ref 100–199)
HDL: 46 mg/dL (ref >39)
LDL Chol Calc (NIH): 70 mg/dL (ref 0–99)
Triglycerides: 288 mg/dL — ABNORMAL HIGH (ref 0–149)
VLDL Cholesterol Cal: 46 mg/dL — ABNORMAL HIGH (ref 5–40)

## 2020-03-06 ENCOUNTER — Encounter: Payer: Self-pay | Admitting: Cardiology

## 2020-03-06 ENCOUNTER — Ambulatory Visit: Payer: No Typology Code available for payment source | Admitting: Cardiology

## 2020-03-06 ENCOUNTER — Other Ambulatory Visit: Payer: Self-pay

## 2020-03-06 VITALS — BP 148/86 | HR 76 | Ht 72.0 in | Wt 209.0 lb

## 2020-03-06 DIAGNOSIS — Z8249 Family history of ischemic heart disease and other diseases of the circulatory system: Secondary | ICD-10-CM

## 2020-03-06 DIAGNOSIS — R931 Abnormal findings on diagnostic imaging of heart and coronary circulation: Secondary | ICD-10-CM

## 2020-03-06 DIAGNOSIS — I10 Essential (primary) hypertension: Secondary | ICD-10-CM

## 2020-03-06 DIAGNOSIS — I739 Peripheral vascular disease, unspecified: Secondary | ICD-10-CM

## 2020-03-06 DIAGNOSIS — E782 Mixed hyperlipidemia: Secondary | ICD-10-CM

## 2020-03-06 DIAGNOSIS — R0609 Other forms of dyspnea: Secondary | ICD-10-CM

## 2020-03-06 MED ORDER — METOPROLOL SUCCINATE ER 50 MG PO TB24: 50.0000 mg | ORAL_TABLET | Freq: Every day | ORAL | 2 refills | Status: DC

## 2020-03-06 NOTE — Progress Notes (Signed)
Patient referred by Berkley Harvey, NP for murmur and shortness of breath  Subjective:   Donald Wade, male    DOB: 11-Dec-1955, 64 y.o.   MRN: [id number]   Chief Complaint  Patient presents with  . Claudication    pt has no compaints today     HPI  64 y.o. Caucasian male with hypertension, exertional dyspnea, claudication.  Work-up showed normal EF, mild AI, mild TR, RVSP 28 mmHg.  Mildly decreased perfusion in both lower extremities, ABI 0.8 on the right, 0.88 on left.  CT cardiac scoring showed elevated calcium score of 385, 37 in left main, 346 in LAD, 2 in RCA, 0 in left circumflex.  Coronary angiogram showed medial calcification without any obstructive coronary artery disease.  LVEDP was normal.  Recent lipid panel shows LDL improved to 70 form 117, TG to 288 from 312, HDL is also lower.  He admits to drinking 2-4 beers every day.  Overall, he is doing well.  He does not do any regular exercise or walking, he stays active with outdoor activities such as pushing a lawn more.  With this level of activity, he does not have any chest pain.  He has mild, unchanged exertional dyspnea.  He does have claudication that improves with 1-2-minute rest.  He does not have any open ulcers or wounds on his feet.  Blood pressure is elevated today.  At home, for the most part, and stays in 130s over 70s.  However, he has noticed readings in 140s.   Current Outpatient Medications on File Prior to Visit  Medication Sig Dispense Refill  . atorvastatin (LIPITOR) 40 MG tablet Take 1 tablet (40 mg total) by mouth daily. 90 tablet 3  . buPROPion (WELLBUTRIN XL) 150 MG 24 hr tablet Take 150 mg by mouth daily.     . clopidogrel (PLAVIX) 75 MG tablet Take 1 tablet (75 mg total) by mouth daily. 90 tablet 3  . EPINEPHrine 0.3 mg/0.3 mL IJ SOAJ injection Inject 0.3 mg into the skin as needed for anaphylaxis.     . fexofenadine (ALLEGRA) 180 MG tablet Take 180 mg by mouth daily as needed.     .  metoprolol succinate (TOPROL XL) 25 MG 24 hr tablet Take 1 tablet (25 mg total) by mouth daily. 90 tablet 1  . Multiple Vitamin (MULTI-VITAMIN) tablet Take 1 tablet by mouth daily.    . niacin 100 MG tablet Take 100 mg by mouth daily as needed (acne).     . Olmesartan-amLODIPine-HCTZ 40-5-12.5 MG TABS Take 1 tablet by mouth daily.    . Omega-3 1000 MG CAPS Take 1,000 mg by mouth 3 (three) times a week.     . vitamin B-12 (CYANOCOBALAMIN) 1000 MCG tablet Take 1,000 mcg by mouth daily.     No current facility-administered medications on file prior to visit.    Cardiovascular and other pertinent studies:  EKG 12/03/2019: Sinus rhythm 90 bpm. Normal EKG.  Coronary angiography 11/23/2019: LM: Normal LAD: Medical calcification. No significant stenosis LCx: Normal RCA: Normal LVEDP 1 mmHg  Lexiscan (Walking with mod Bruce) Sestamibi Stress Test 10/20/2019: Nondiagnostic ECG stress. LV is minimally dilated with LV end diastolic volume of 071 mL.  Myocardial perfusion is normal. Stress LV EF: 50%.  No previous exam available for comparison. Low risk study.   Echocardiogram 10/19/2019:  Normal left ventricular systolic function, LVEF 55 to 60%.  Moderate concentric hypertrophy.  No obvious regional wall motion abnormalities.  Normal diastolic filling  pressures.  Left atrial dilatation.  Mild aortic regurgitation.  Mild tricuspid regurgitation.  No pulmonary hypertension, estimated RVSP 28 mmHg.  CT Cardiac scoring 10/19/2019: - Total Calcium Score: 385. -- Left Main: 37. -- Right Coronary: 2 -- Left Anterior Descending: 346. -- Circumflex: 0.  ABI 10/12/2019:  This exam reveals mildly decreased perfusion of the right lower extremity,  noted at the anterior tibial and post tibial artery level (ABI 0.80) with  moderately abnormal waveform at right ankle). This exam reveals mildly  decreased perfusion of the left lower extremity, noted at the post tibial  artery level (ABI 0.88)  with mildly abnormal waveform at left ankle.    Recent labs: 02/17/2020: Chol 162, TG 288, HDL 46, LDL 70  10/01/2019: Glucose 120, BUN/Cr 12/0.92. EGFR 89. Na/K 135/4.3. Rest of the CMP normal  10/01/2019: HbA1C 5.4% Chol 244, TG 312, HDL 65, LDL 117  06/28/2019: H/H 14.5/41.5. MCV 94.6. Platelets 249   Review of Systems  Cardiovascular: Positive for claudication and dyspnea on exertion. Negative for chest pain, leg swelling, palpitations and syncope.        Vitals:   03/06/20 0937 03/06/20 0942  BP: (!) 152/89 (!) 148/86  Pulse: 77 76  SpO2: 96%      Objective:   Physical Exam Vitals and nursing note reviewed.  Constitutional:      General: He is not in acute distress.    Appearance: He is well-developed.  Neck:     Vascular: No JVD.  Cardiovascular:     Rate and Rhythm: Normal rate and regular rhythm.     Pulses:          Dorsalis pedis pulses are 2+ on the right side and 0 on the left side.       Posterior tibial pulses are 1+ on the right side and 0 on the left side.     Heart sounds: Normal heart sounds. No murmur heard.   Pulmonary:     Effort: Pulmonary effort is normal.     Breath sounds: Normal breath sounds. No wheezing or rales.  Neurological:     Mental Status: He is alert and oriented to person, place, and time.          Assessment & Recommendations:   64 y.o. Caucasian male with hypertension, exertional dyspnea, claudication.  Exertional dyspnea: Unlikely of cardiac etiology given normal coronaries, normal LVEDP, no evidence of pulmonary hypertension on echocardiogram.  Suspect deconditioning or alternate etiology.  Continue regular physical activity.  Elevated calcium score: Mostly media calcification without any significant new luminal narrowing on coronary angiogram.  Continue risk factor modification.  PAD: Claudication, abnormal physical exam, and mildly reduced ABI.  Currently on Plavix. Encouraged regular walking.  Continue  aggressive blood pressure and lipid control.  Encouraged heart healthy diet.  Hypertension: Uncontrolled.  Increase metoprolol succinate to 50 mg daily.  Continue rest of the antihypertensive therapy.  Hyperlipidemia: LDL improved, but triglyceride remains elevated.  He admits to drinking 2-4 beers every day.  Encouraged him to cut down beer drinking.  We will repeat fasting lipid panel in 3 months.  Follow-up in 3 months.  Nigel Mormon, MD [facility] [place] Pager: [phone] Office: [phone]

## 2020-04-14 ENCOUNTER — Encounter (HOSPITAL_COMMUNITY): Payer: Self-pay

## 2020-04-14 ENCOUNTER — Other Ambulatory Visit: Payer: Self-pay

## 2020-04-14 ENCOUNTER — Emergency Department (HOSPITAL_COMMUNITY)
Admission: EM | Admit: 2020-04-14 | Discharge: 2020-04-14 | Disposition: A | Discharge status: Home / Self Care | Payer: No Typology Code available for payment source | Attending: Emergency Medicine | Admitting: Emergency Medicine

## 2020-04-14 DIAGNOSIS — Z91030 Bee allergy status: Secondary | ICD-10-CM | POA: Insufficient documentation

## 2020-04-14 DIAGNOSIS — Z79899 Other long term (current) drug therapy: Secondary | ICD-10-CM | POA: Diagnosis not present

## 2020-04-14 DIAGNOSIS — T63441A Toxic effect of venom of bees, accidental (unintentional), initial encounter: Secondary | ICD-10-CM | POA: Insufficient documentation

## 2020-04-14 DIAGNOSIS — I10 Essential (primary) hypertension: Secondary | ICD-10-CM | POA: Insufficient documentation

## 2020-04-14 MED ORDER — PREDNISONE 10 MG PO TABS: 60.0000 mg | ORAL_TABLET | Freq: Every day | ORAL | 0 refills | Status: AC

## 2020-04-14 NOTE — ED Triage Notes (Signed)
Patient states he was drinking a beer and there was a bee in the can. Patient was stung on the tongue. Patient gave himself the EpiPen and took Benadryl 25 mg x 2 prior to going to an UC. The UC gave the patient a Decadron, Benadryl, and Epi inj. Patient was instructed to come to the Ed for further evaluation.  Patient has tongue swelling, but is able to swallow without difficulty.

## 2020-04-14 NOTE — Discharge Instructions (Signed)
If you notice any new or sudden swelling of your throat, muffled voice, or difficulty breathing, give yourself another epi pen injection and get back to the ER immediately.    A small number of people who are treated for allergic reactions can have a rebound reaction at a later time.  We talked about this in the ER.  I felt it was safe to discharge you at this time, and also prescribed you some steroids for 2 days to help ensure the swelling doesn't return.  You can take your next dose of benadryl tonight at 10 pm before bedtime.

## 2020-04-14 NOTE — ED Provider Notes (Signed)
[place] DEPT Provider Note   CSN: [id number] Arrival date & time: 04/14/20  1812     History Chief Complaint  Patient presents with  . Insect Bite    Donald Wade is a 64 y.o. 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  He reports that a bee got into his beer this afternoon and he went to take a step around 5 PM and was stung on the left side of the tongue.  He said he immediately administered his epinephrine pen at home in his right thigh.  He went to an urgent care.  At that urgent care he was given [place] as well as Benadryl and told to come to the ER.  Since arriving in the ED, he feels like his tongue swelling has improved a minor amount.  He denies any throat tightness or difficulty speaking or breathing.  He denies any urticaria or rash anywhere in his body.  He denies any lightheadedness, nausea vomiting or diarrhea.  With his prior allergies, he denies any history of ever needing intubation.   HPI     Past Medical History:  Diagnosis Date  . Heart murmur   . Hyperlipidemia   . Hypertension     Patient Active Problem List   Diagnosis Date Noted  . Mixed hyperlipidemia 03/06/2020  . Elevated coronary artery calcium score 11/12/2019  . Family history of early CAD 10/13/2019  . Exertional dyspnea 10/13/2019  . Essential hypertension 10/13/2019  . Claudication in peripheral vascular disease ([place]) 10/13/2019    Past Surgical History:  Procedure Laterality Date  . APPENDECTOMY  1980  . LEFT HEART CATH AND CORONARY ANGIOGRAPHY N/A 11/23/2019   Procedure: LEFT HEART CATH AND CORONARY ANGIOGRAPHY;  Surgeon: Nigel Mormon, MD;  Location: [facility];  Service: Cardiovascular;  Laterality: N/A;       Family History  Problem Relation Age of Onset  . Heart disease Mother   . Atrial fibrillation Mother     Social History   Tobacco Use    . Smoking status: Never Smoker  . Smokeless tobacco: Never Used  Vaping Use  . Vaping Use: Never used  Substance Use Topics  . Alcohol use: Yes    Comment: 2 beers daily  . Drug use: Never    Home Medications Prior to Admission medications   Medication Sig Start Date End Date Taking? Authorizing Provider  atorvastatin (LIPITOR) 40 MG tablet Take 1 tablet (40 mg total) by mouth daily. 12/03/19   Patwardhan, Reynold Bowen, MD  buPROPion (WELLBUTRIN XL) 150 MG 24 hr tablet Take 150 mg by mouth daily.  12/17/16   [provider]  clopidogrel (PLAVIX) 75 MG tablet Take 1 tablet (75 mg total) by mouth daily. 12/03/19   Patwardhan, Manish J, MD  EPINEPHrine 0.3 mg/0.3 mL IJ SOAJ injection Inject 0.3 mg into the skin as needed for anaphylaxis.  05/01/16   [provider]  fexofenadine (ALLEGRA) 180 MG tablet Take 180 mg by mouth daily as needed.  11/30/09   [provider]  metoprolol succinate (TOPROL XL) 50 MG 24 hr tablet Take 1 tablet (50 mg total) by mouth daily. 03/06/20   Patwardhan, Reynold Bowen, MD  Multiple Vitamin (MULTI-VITAMIN) tablet Take 1 tablet by mouth daily.    [provider]  niacin 100 MG tablet Take 100 mg by mouth daily as needed (acne).  06/28/19  [provider]  Olmesartan-amLODIPine-HCTZ 40-5-12.5 MG TABS Take 1 tablet by mouth daily. 12/17/16   [provider]  Omega-3 1000 MG CAPS Take 1,000 mg by mouth 3 (three) times a week.     [provider]  predniSONE (DELTASONE) 10 MG tablet Take 6 tablets (60 mg total) by mouth daily for 2 days. 04/15/20 04/17/20  Wyvonnia Dusky, MD  vitamin B-12 (CYANOCOBALAMIN) 1000 MCG tablet Take 1,000 mcg by mouth daily.    [provider]    Allergies    Bee venom and Penicillin g  Review of Systems   Review of Systems  Constitutional: Negative for chills and fever.  HENT: Positive for facial swelling. Negative for ear pain, trouble swallowing and voice change.   Eyes:  Negative for pain and visual disturbance.  Respiratory: Negative for cough, choking, chest tightness, shortness of breath, wheezing and stridor.   Cardiovascular: Negative for chest pain and palpitations.  Gastrointestinal: Negative for abdominal pain, nausea and vomiting.  Musculoskeletal: Negative for arthralgias and myalgias.  Skin: Negative for rash and wound.  Neurological: Negative for syncope and light-headedness.  Psychiatric/Behavioral: Negative for agitation and confusion.  All other systems reviewed and are negative.   Physical Exam Updated Vital Signs BP (!) 150/88 (BP Location: Right Arm)   Pulse 65   Temp 98.2 F (36.8 C) (Oral)   Resp 16   Ht 6' (1.829 m)   Wt 95.3 kg   SpO2 98%   BMI 28.48 kg/m   Physical Exam Vitals and nursing note reviewed.  Constitutional:      Appearance: He is well-developed.  HENT:     Head: Normocephalic and atraumatic.     Comments: Tongue swelling on left tongue (see photo) Tongue is not protruding Oropharynx non-erythematous.  No tonsillar swelling or exudate.  No uvular deviation.  No drooling. No brawny edema. No stridor. Voice is clear and is not muffled.  Eyes:     Conjunctiva/sclera: Conjunctivae normal.  Cardiovascular:     Rate and Rhythm: Normal rate and regular rhythm.     Pulses: Normal pulses.  Pulmonary:     Effort: Pulmonary effort is normal. No respiratory distress.     Breath sounds: Normal breath sounds.  Abdominal:     Palpations: Abdomen is soft.     Tenderness: There is no abdominal tenderness.  Musculoskeletal:     Cervical back: Neck supple.  Skin:    General: Skin is warm and dry.     Findings: No rash.  Neurological:     General: No focal deficit present.     Mental Status: He is alert and oriented to person, place, and time.  Psychiatric:        Mood and Affect: Mood normal.        Behavior: Behavior normal.        ED Results / Procedures / Treatments   Labs (all labs ordered are  listed, but only abnormal results are displayed) Labs Reviewed - No data to display  EKG None  Radiology No results found.  Procedures Procedures (including critical care time)  Medications Ordered in ED Medications - No data to display  ED Course  I have reviewed the triage vital signs and the nursing notes.  Pertinent labs & imaging results that were available during my care of the patient were reviewed by me and considered in my medical decision making (see chart for details).  64 yo male presenting to ED with bee sting to tongue  occurring at 5 pm.  He immediately administered epi pen at home afterwards. Seen at [facility] where he received steroids and benadryl.  Now presenting to ED as referral from UC for further evaluation.  On exam he has unilateral left sided tongue swelling.  No visible retained foreign stinger.  He has no evidence of airway compromise as noted in PE above.  Voice is clear.  No tongue protrusion.  No hypoxia or stridor or wheezing.  He has no hives, no other evidence of anaphylaxis per his reported symptoms or presentation in the ED.  Plan to monitor for a full 4 hours after initial incident and epi-pen use.  Will reassess at 9 am.  Patient is repeatedly asking to go home, as he has company visiting tonight and he feels well.  I explained that we need to monitor this a while longer in the ED and he is agreeable to staying until 9 pm.  Clinical Course as of Apr 16 11  Fri Apr 14, 2020  2058 Pt has significant reduction in swelling in tongue, looks normal now.  Repeat airway exam benign.  His wife as at bedside.  Okay for discharge home with his wife.  He has additional epi pens at home.  Instructed to take benadryl tonight at 10 pm before bedtime.  Both he and his wife were given close return precautions to call 911 immediately if he notes anyway throat tightness, change in voice, difficulty breathing.  He can administer another epi-pen at home if necessary and says  he has 4 more doses available at home.   [MT]    Clinical Course User Index [MT] Hrithik Boschee, Carola Rhine, MD    Final Clinical Impression(s) / ED Diagnoses Final diagnoses:  Bee sting reaction, accidental or unintentional, initial encounter    Rx / DC Orders ED Discharge Orders         Ordered    predniSONE (DELTASONE) 10 MG tablet  Daily     Discontinue  Reprint     04/14/20 2102           Wyvonnia Dusky, MD 04/15/20 [phone]

## 2020-04-14 NOTE — ED Notes (Signed)
Pt states he has an allergy to bee stings but this time around he has not noticed any hives or itching only the swelling of his tongue where he was stung. Upon assessment pt has notable swelling to left lateral aspect of tongue. There is no obstruction of the airway. Pt denies difficulty swallowing or breathing at this time. PT AOx4

## 2020-04-24 ENCOUNTER — Other Ambulatory Visit: Payer: Self-pay | Admitting: Cardiology

## 2020-04-24 DIAGNOSIS — I739 Peripheral vascular disease, unspecified: Secondary | ICD-10-CM

## 2020-04-24 DIAGNOSIS — I10 Essential (primary) hypertension: Secondary | ICD-10-CM

## 2020-06-01 LAB — BASIC METABOLIC PANEL
BUN/Creatinine Ratio: 12 (ref 10–24)
BUN: 10 mg/dL (ref 8–27)
CO2: 24 mmol/L (ref 20–29)
Calcium: 9.2 mg/dL (ref 8.6–10.2)
Chloride: 96 mmol/L (ref 96–106)
Creatinine, Ser: 0.81 mg/dL (ref 0.76–1.27)
GFR calc Af Amer: 110 mL/min/{1.73_m2} (ref >59)
GFR calc non Af Amer: 95 mL/min/{1.73_m2} (ref >59)
Glucose: 101 mg/dL — ABNORMAL HIGH (ref 65–99)
Potassium: 4.4 mmol/L (ref 3.5–5.2)
Sodium: 134 mmol/L (ref 134–144)

## 2020-06-01 LAB — LIPID PANEL
Chol/HDL Ratio: 3.6 ratio (ref 0.0–5.0)
Cholesterol, Total: 200 mg/dL — ABNORMAL HIGH (ref 100–199)
HDL: 55 mg/dL (ref >39)
LDL Chol Calc (NIH): 93 mg/dL (ref 0–99)
Triglycerides: 317 mg/dL — ABNORMAL HIGH (ref 0–149)
VLDL Cholesterol Cal: 52 mg/dL — ABNORMAL HIGH (ref 5–40)

## 2020-06-07 ENCOUNTER — Ambulatory Visit: Payer: No Typology Code available for payment source | Admitting: Cardiology

## 2020-06-08 ENCOUNTER — Ambulatory Visit: Payer: No Typology Code available for payment source | Admitting: Cardiology

## 2020-06-08 ENCOUNTER — Encounter: Payer: Self-pay | Admitting: Cardiology

## 2020-06-08 ENCOUNTER — Other Ambulatory Visit: Payer: Self-pay

## 2020-06-08 VITALS — BP 172/97 | HR 87 | Resp 16 | Ht 72.0 in | Wt 208.0 lb

## 2020-06-08 DIAGNOSIS — I739 Peripheral vascular disease, unspecified: Secondary | ICD-10-CM

## 2020-06-08 DIAGNOSIS — E781 Pure hyperglyceridemia: Secondary | ICD-10-CM

## 2020-06-08 DIAGNOSIS — R931 Abnormal findings on diagnostic imaging of heart and coronary circulation: Secondary | ICD-10-CM

## 2020-06-08 DIAGNOSIS — I10 Essential (primary) hypertension: Secondary | ICD-10-CM

## 2020-06-08 DIAGNOSIS — E782 Mixed hyperlipidemia: Secondary | ICD-10-CM

## 2020-06-08 MED ORDER — CILOSTAZOL 50 MG PO TABS: 50.0000 mg | ORAL_TABLET | Freq: Two times a day (BID) | ORAL | 3 refills | Status: DC

## 2020-06-08 NOTE — Progress Notes (Signed)
Patient referred by Berkley Harvey, NP for murmur and shortness of breath  Subjective:   Donald Wade, male    DOB: 05-18-1956, 64 y.o.   MRN: [id number]   Chief Complaint  Patient presents with  . Shortness of Breath  . PAD  . Hyperlipidemia  . Follow-up     HPI  64 y.o. Caucasian male with hypertension, coronary calcification, PAD, claudication.  Patient continues to have stable claudication symptoms, with walking about 200 yards.  Symptoms resolved quickly after rest.  He denies any chest pain, shortness of breath.  Reviewed recent lipid panel with the patient.  He admits that he continues to drink to help him use on a daily basis.  His recent lipid panel was performed nonfasting.  Current Outpatient Medications on File Prior to Visit  Medication Sig Dispense Refill  . atorvastatin (LIPITOR) 40 MG tablet Take 1 tablet (40 mg total) by mouth daily. 90 tablet 3  . buPROPion (WELLBUTRIN XL) 150 MG 24 hr tablet Take 150 mg by mouth daily.     . clopidogrel (PLAVIX) 75 MG tablet Take 1 tablet (75 mg total) by mouth daily. 90 tablet 3  . EPINEPHrine 0.3 mg/0.3 mL IJ SOAJ injection Inject 0.3 mg into the skin as needed for anaphylaxis.     . fexofenadine (ALLEGRA) 180 MG tablet Take 180 mg by mouth daily as needed.     . metoprolol succinate (TOPROL XL) 50 MG 24 hr tablet Take 1 tablet (50 mg total) by mouth daily. 90 tablet 2  . Multiple Vitamin (MULTI-VITAMIN) tablet Take 1 tablet by mouth daily.    . niacin 100 MG tablet Take 100 mg by mouth daily as needed (acne).     . Olmesartan-amLODIPine-HCTZ 40-5-12.5 MG TABS Take 1 tablet by mouth daily.    . Omega-3 1000 MG CAPS Take 1,000 mg by mouth 3 (three) times a week.     . vitamin B-12 (CYANOCOBALAMIN) 1000 MCG tablet Take 1,000 mcg by mouth daily.     No current facility-administered medications on file prior to visit.    Cardiovascular and other pertinent studies:  EKG 06/08/2020: Sinus rhythm 67 bpm  Nonspecific T  wave abnormality   Coronary angiography 11/23/2019: LM: Normal LAD: Medical calcification. No significant stenosis LCx: Normal RCA: Normal LVEDP 1 mmHg  Lexiscan (Walking with mod Bruce) Sestamibi Stress Test 10/20/2019: Nondiagnostic ECG stress. LV is minimally dilated with LV end diastolic volume of 578 mL.  Myocardial perfusion is normal. Stress LV EF: 50%.  No previous exam available for comparison. Low risk study.   Echocardiogram 10/19/2019:  Normal left ventricular systolic function, LVEF 55 to 60%.  Moderate concentric hypertrophy.  No obvious regional wall motion abnormalities.  Normal diastolic filling pressures.  Left atrial dilatation.  Mild aortic regurgitation.  Mild tricuspid regurgitation.  No pulmonary hypertension, estimated RVSP 28 mmHg.  CT Cardiac scoring 10/19/2019: - Total Calcium Score: 385. -- Left Main: 37. -- Right Coronary: 2 -- Left Anterior Descending: 346. -- Circumflex: 0.  ABI 10/12/2019:  This exam reveals mildly decreased perfusion of the right lower extremity,  noted at the anterior tibial and post tibial artery level (ABI 0.80) with  moderately abnormal waveform at right ankle). This exam reveals mildly  decreased perfusion of the left lower extremity, noted at the post tibial  artery level (ABI 0.88) with mildly abnormal waveform at left ankle.    Recent labs: 05/31/2020: Glucose 101, BUN/Cr 10/0.81. EGFR normal. Na/K 134/4.4. Rest of  the CMP normal Chol 200, TG 317, HDL 55, LDL 93  02/17/2020: Chol 162, TG 288, HDL 46, LDL 70  10/01/2019: Glucose 120, BUN/Cr 12/0.92. EGFR 89. Na/K 135/4.3. Rest of the CMP normal  10/01/2019: HbA1C 5.4% Chol 244, TG 312, HDL 65, LDL 117  06/28/2019: H/H 14.5/41.5. MCV 94.6. Platelets 249   Review of Systems  Cardiovascular: Positive for claudication and dyspnea on exertion. Negative for chest pain, leg swelling, palpitations and syncope.        Vitals:   06/08/20 1146  BP: (!)  166/95  Pulse: 70  Resp: 16  SpO2: 99%     Objective:   Physical Exam Vitals and nursing note reviewed.  Constitutional:      General: He is not in acute distress.    Appearance: He is well-developed.  Neck:     Vascular: No JVD.  Cardiovascular:     Rate and Rhythm: Normal rate and regular rhythm.     Pulses:          Femoral pulses are 1+ on the right side and 0 on the left side.      Popliteal pulses are 1+ on the right side and 1+ on the left side.       Dorsalis pedis pulses are 1+ on the right side and 0 on the left side.       Posterior tibial pulses are 0 on the right side and 0 on the left side.     Heart sounds: Normal heart sounds. No murmur heard.   Pulmonary:     Effort: Pulmonary effort is normal.     Breath sounds: Normal breath sounds. No wheezing or rales.  Neurological:     Mental Status: He is alert and oriented to person, place, and time.          Assessment & Recommendations:   64 y.o. Caucasian male with hypertension, coronary calcification, PAD, claudication.   Elevated calcium score: Mostly media calcification without any significant new luminal narrowing on coronary angiogram.  Continue risk factor modification.  PAD: Rutherford class III laudication, abnormal physical exam, and mildly reduced ABI.  Currently on Plavix. Added Pletal 50 mg bid. Encouraged regular walking.  Continue aggressive blood pressure and lipid control.  Encouraged heart healthy diet.  Hypertension: Elevated today. Home BP log shows normal readings. No change made today.  Hyperlipidemia: LDL improved, but triglyceride remains elevated.  He still drinks 2 hoppy beers/day. Encouraged to reduce beer consumption. Repeat fasting lipid panel in 3 months. Coul try Vascepa then.  F/u in 3 months.  Nigel Mormon, MD [facility] Pager: [phone] Office: [phone]

## 2020-06-09 ENCOUNTER — Other Ambulatory Visit: Payer: Self-pay | Admitting: Cardiology

## 2020-06-09 DIAGNOSIS — I739 Peripheral vascular disease, unspecified: Secondary | ICD-10-CM

## 2020-06-09 MED ORDER — CILOSTAZOL 50 MG PO TABS: 50.0000 mg | ORAL_TABLET | Freq: Two times a day (BID) | ORAL | 1 refills | Status: DC

## 2020-06-09 NOTE — Telephone Encounter (Signed)
error 

## 2020-06-24 ENCOUNTER — Ambulatory Visit: Payer: No Typology Code available for payment source

## 2020-08-21 ENCOUNTER — Other Ambulatory Visit: Payer: Self-pay

## 2020-08-21 ENCOUNTER — Inpatient Hospital Stay (HOSPITAL_COMMUNITY)
Admission: EM | Admit: 2020-08-21 | Discharge: 2020-08-23 | DRG: 375 | Disposition: A | Discharge status: Home / Self Care | Payer: No Typology Code available for payment source | Attending: Internal Medicine | Admitting: Internal Medicine

## 2020-08-21 DIAGNOSIS — Z800 Family history of malignant neoplasm of digestive organs: Secondary | ICD-10-CM

## 2020-08-21 DIAGNOSIS — E782 Mixed hyperlipidemia: Secondary | ICD-10-CM | POA: Diagnosis present

## 2020-08-21 DIAGNOSIS — Z9049 Acquired absence of other specified parts of digestive tract: Secondary | ICD-10-CM

## 2020-08-21 DIAGNOSIS — R0609 Other forms of dyspnea: Secondary | ICD-10-CM | POA: Diagnosis present

## 2020-08-21 DIAGNOSIS — I739 Peripheral vascular disease, unspecified: Secondary | ICD-10-CM | POA: Diagnosis present

## 2020-08-21 DIAGNOSIS — D649 Anemia, unspecified: Secondary | ICD-10-CM

## 2020-08-21 DIAGNOSIS — K922 Gastrointestinal hemorrhage, unspecified: Secondary | ICD-10-CM | POA: Diagnosis present

## 2020-08-21 DIAGNOSIS — I10 Essential (primary) hypertension: Secondary | ICD-10-CM | POA: Diagnosis present

## 2020-08-21 DIAGNOSIS — C160 Malignant neoplasm of cardia: Secondary | ICD-10-CM | POA: Diagnosis not present

## 2020-08-21 DIAGNOSIS — K219 Gastro-esophageal reflux disease without esophagitis: Secondary | ICD-10-CM | POA: Diagnosis present

## 2020-08-21 DIAGNOSIS — Z880 Allergy status to penicillin: Secondary | ICD-10-CM

## 2020-08-21 DIAGNOSIS — R011 Cardiac murmur, unspecified: Secondary | ICD-10-CM | POA: Diagnosis present

## 2020-08-21 DIAGNOSIS — Z8249 Family history of ischemic heart disease and other diseases of the circulatory system: Secondary | ICD-10-CM

## 2020-08-21 DIAGNOSIS — R195 Other fecal abnormalities: Secondary | ICD-10-CM | POA: Diagnosis not present

## 2020-08-21 DIAGNOSIS — Z91030 Bee allergy status: Secondary | ICD-10-CM

## 2020-08-21 DIAGNOSIS — R1310 Dysphagia, unspecified: Secondary | ICD-10-CM | POA: Diagnosis present

## 2020-08-21 DIAGNOSIS — D62 Acute posthemorrhagic anemia: Secondary | ICD-10-CM | POA: Diagnosis present

## 2020-08-21 DIAGNOSIS — D509 Iron deficiency anemia, unspecified: Secondary | ICD-10-CM | POA: Diagnosis present

## 2020-08-21 DIAGNOSIS — Z7902 Long term (current) use of antithrombotics/antiplatelets: Secondary | ICD-10-CM

## 2020-08-21 DIAGNOSIS — Z20822 Contact with and (suspected) exposure to covid-19: Secondary | ICD-10-CM | POA: Diagnosis present

## 2020-08-21 DIAGNOSIS — Z79899 Other long term (current) drug therapy: Secondary | ICD-10-CM

## 2020-08-21 LAB — CBC
HCT: 19.5 % — ABNORMAL LOW (ref 39.0–52.0)
Hemoglobin: 5.8 g/dL — CL (ref 13.0–17.0)
MCH: 25.6 pg — ABNORMAL LOW (ref 26.0–34.0)
MCHC: 29.7 g/dL — ABNORMAL LOW (ref 30.0–36.0)
MCV: 85.9 fL (ref 80.0–100.0)
Platelets: 376 10*3/uL (ref 150–400)
RBC: 2.27 MIL/uL — ABNORMAL LOW (ref 4.22–5.81)
RDW: 17.4 % — ABNORMAL HIGH (ref 11.5–15.5)
WBC: 3.6 10*3/uL — ABNORMAL LOW (ref 4.0–10.5)
nRBC: 0 % (ref 0.0–0.2)

## 2020-08-21 LAB — OCCULT BLOOD X 1 CARD TO LAB, STOOL: Fecal Occult Bld: NEGATIVE

## 2020-08-21 LAB — PREPARE RBC (CROSSMATCH)

## 2020-08-21 LAB — COMPREHENSIVE METABOLIC PANEL
ALT: 21 U/L (ref 0–44)
AST: 24 U/L (ref 15–41)
Albumin: 3.4 g/dL — ABNORMAL LOW (ref 3.5–5.0)
Alkaline Phosphatase: 79 U/L (ref 38–126)
Anion gap: 9 (ref 5–15)
BUN: 11 mg/dL (ref 8–23)
CO2: 25 mmol/L (ref 22–32)
Calcium: 8.9 mg/dL (ref 8.9–10.3)
Chloride: 101 mmol/L (ref 98–111)
Creatinine, Ser: 0.92 mg/dL (ref 0.61–1.24)
GFR, Estimated: >60 mL/min (ref >60)
Glucose, Bld: 124 mg/dL — ABNORMAL HIGH (ref 70–99)
Potassium: 4 mmol/L (ref 3.5–5.1)
Sodium: 135 mmol/L (ref 135–145)
Total Bilirubin: 0.6 mg/dL (ref 0.3–1.2)
Total Protein: 6.4 g/dL — ABNORMAL LOW (ref 6.5–8.1)

## 2020-08-21 LAB — RESP PANEL BY RT-PCR (FLU A&B, COVID) ARPGX2
Influenza A by PCR: NEGATIVE
Influenza B by PCR: NEGATIVE
SARS Coronavirus 2 by RT PCR: NEGATIVE

## 2020-08-21 LAB — ABO/RH: ABO/RH(D): O POS

## 2020-08-21 MED ORDER — AMLODIPINE BESYLATE 5 MG PO TABS
5.0000 mg | ORAL_TABLET | Freq: Every day | ORAL | Status: DC
  Administered 2020-08-21 – 2020-08-23 (×2): 5 mg via ORAL
  Filled 2020-08-21 (×2): qty 1

## 2020-08-21 MED ORDER — ACETAMINOPHEN 650 MG RE SUPP: 650.0000 mg | Freq: Four times a day (QID) | RECTAL | Status: DC | PRN

## 2020-08-21 MED ORDER — ONDANSETRON HCL 4 MG PO TABS: 4.0000 mg | ORAL_TABLET | Freq: Four times a day (QID) | ORAL | Status: DC | PRN

## 2020-08-21 MED ORDER — SODIUM CHLORIDE 0.9 % IV SOLN
10.0000 mL/h | Freq: Once | INTRAVENOUS | Status: AC
  Administered 2020-08-21: 17:00:00 10 mL/h via INTRAVENOUS

## 2020-08-21 MED ORDER — ATORVASTATIN CALCIUM 40 MG PO TABS
40.0000 mg | ORAL_TABLET | Freq: Every day | ORAL | Status: DC
Start: 2020-08-21 — End: 2020-08-23
  Administered 2020-08-21 – 2020-08-23 (×2): 40 mg via ORAL
  Filled 2020-08-21 (×2): qty 1

## 2020-08-21 MED ORDER — PANTOPRAZOLE SODIUM 40 MG IV SOLR: 40.0000 mg | Freq: Two times a day (BID) | INTRAVENOUS | Status: DC

## 2020-08-21 MED ORDER — POLYETHYLENE GLYCOL 3350 17 G PO PACK: 17.0000 g | PACK | Freq: Every day | ORAL | Status: DC | PRN

## 2020-08-21 MED ORDER — HYDROCHLOROTHIAZIDE 12.5 MG PO CAPS
12.5000 mg | ORAL_CAPSULE | Freq: Every day | ORAL | Status: DC
  Administered 2020-08-21 – 2020-08-23 (×2): 12.5 mg via ORAL
  Filled 2020-08-21 (×2): qty 1

## 2020-08-21 MED ORDER — PANTOPRAZOLE SODIUM 40 MG IV SOLR
40.0000 mg | Freq: Two times a day (BID) | INTRAVENOUS | Status: DC
  Administered 2020-08-21: 19:00:00 40 mg via INTRAVENOUS
  Filled 2020-08-21: qty 40

## 2020-08-21 MED ORDER — IRBESARTAN 300 MG PO TABS
300.0000 mg | ORAL_TABLET | Freq: Every day | ORAL | Status: DC
  Administered 2020-08-23: 09:00:00 300 mg via ORAL
  Filled 2020-08-21 (×3): qty 1

## 2020-08-21 MED ORDER — ONDANSETRON HCL 4 MG/2ML IJ SOLN: 4.0000 mg | Freq: Four times a day (QID) | INTRAMUSCULAR | Status: DC | PRN

## 2020-08-21 MED ORDER — ACETAMINOPHEN 325 MG PO TABS: 650.0000 mg | ORAL_TABLET | Freq: Four times a day (QID) | ORAL | Status: DC | PRN

## 2020-08-21 MED ORDER — BUPROPION HCL ER (XL) 150 MG PO TB24
150.0000 mg | ORAL_TABLET | Freq: Every day | ORAL | Status: DC
  Administered 2020-08-21 – 2020-08-23 (×2): 150 mg via ORAL
  Filled 2020-08-21 (×2): qty 1

## 2020-08-21 MED ORDER — METOPROLOL SUCCINATE ER 25 MG PO TB24
25.0000 mg | ORAL_TABLET | Freq: Every day | ORAL | Status: DC
  Administered 2020-08-21 – 2020-08-23 (×2): 25 mg via ORAL
  Filled 2020-08-21 (×2): qty 1

## 2020-08-21 NOTE — Consult Note (Addendum)
Referring Provider: Providence Lanius, PA-C (ED) Primary Care Physician:  Berkley Harvey, NP Primary Gastroenterologist:  Alphonzo Cruise (Dr. Penelope Coop)   Reason for Consultation:  Anemia, melena  HPI: Donald Wade is a 64 y.o. male with past medical history of hypertension and PAD presenting for consultation of anemia and melena.  Patient states he was supposed to have a vascular surgery completed in [place] today.  However, his recent blood work revealed hemoglobin of 6, so they advised him to present to the ED.  Patient states he has been having black, tarry stools for the past 1.5 months.  He is also noted progressive fatigue, as well as dyspnea on exertion.  He notes that his legs feel weak.  He denies any abdominal pain, nausea, or vomiting.  He has also noted intermittent GERD for many years.  Over the past 1 to 2 months, he has noted dysphagia to both solids and liquids.  He also states his appetite has been poor.  He notes a weight loss of 6 pounds over the last 2 to 3 months.  He takes Plavix daily, last dose yesterday.  He previously took 81 mg aspirin daily but states he has not taken it since he was started on Plavix in 11/2019.  Denies any additional NSAID use.  Drinks 1-4 beers daily.  Family history pertinent for mother with colon cancer, deceased age 45, the patient is unsure of the age of diagnosis.  Last colonoscopy 09/2015: sigmoid diverticulosis, otherwise normal  Past Medical History:  Diagnosis Date  . Heart murmur   . Hyperlipidemia   . Hypertension     Past Surgical History:  Procedure Laterality Date  . APPENDECTOMY  1980  . LEFT HEART CATH AND CORONARY ANGIOGRAPHY N/A 11/23/2019   Procedure: LEFT HEART CATH AND CORONARY ANGIOGRAPHY;  Surgeon: Nigel Mormon, MD;  Location: [place] [facility];  Service: Cardiovascular;  Laterality: N/A;    Prior to Admission medications   Medication Sig Start Date End Date Taking? Authorizing Provider  atorvastatin (LIPITOR)  40 MG tablet Take 1 tablet (40 mg total) by mouth daily. 12/03/19   Patwardhan, Reynold Bowen, MD  buPROPion (WELLBUTRIN XL) 150 MG 24 hr tablet Take 150 mg by mouth daily.  12/17/16   [provider]  cilostazol (PLETAL) 50 MG tablet Take 1 tablet (50 mg total) by mouth 2 (two) times daily. 06/09/20   Patwardhan, Reynold Bowen, MD  clopidogrel (PLAVIX) 75 MG tablet Take 1 tablet (75 mg total) by mouth daily. 12/03/19   Patwardhan, Manish J, MD  EPINEPHrine 0.3 mg/0.3 mL IJ SOAJ injection Inject 0.3 mg into the skin as needed for anaphylaxis.  05/01/16   [provider]  fexofenadine (ALLEGRA) 180 MG tablet Take 180 mg by mouth daily as needed.  11/30/09   [provider]  metoprolol succinate (TOPROL XL) 50 MG 24 hr tablet Take 1 tablet (50 mg total) by mouth daily. 03/06/20   Patwardhan, Reynold Bowen, MD  Multiple Vitamin (MULTI-VITAMIN) tablet Take 1 tablet by mouth daily.    [provider]  niacin 100 MG tablet Take 100 mg by mouth daily as needed (acne).  06/28/19   [provider]  Olmesartan-amLODIPine-HCTZ 40-5-12.5 MG TABS Take 1 tablet by mouth daily. 12/17/16   [provider]  Omega-3 1000 MG CAPS Take 1,000 mg by mouth 3 (three) times a week.     [provider]  vitamin B-12 (CYANOCOBALAMIN) 1000 MCG tablet Take 1,000 mcg by mouth daily.  [provider]    Scheduled Meds: Continuous Infusions: . sodium chloride     PRN Meds:.  Allergies as of 08/21/2020 - Review Complete 08/21/2020  Allergen Reaction Noted  . Bee venom Anaphylaxis 05/02/2014  . Penicillin g Hives 05/02/2014    Family History  Problem Relation Age of Onset  . Heart disease Mother   . Atrial fibrillation Mother     Social History   Socioeconomic History  . Marital status: Married    Spouse name: Not on file  . Number of children: 2  . Years of education: Not on file  . Highest education level: Not on file  Occupational History  . Not on file   Tobacco Use  . Smoking status: Never Smoker  . Smokeless tobacco: Never Used  Vaping Use  . Vaping Use: Never used  Substance and Sexual Activity  . Alcohol use: Yes    Comment: 2 beers daily  . Drug use: Never  . Sexual activity: Not on file  Other Topics Concern  . Not on file  Social History Narrative  . Not on file   Social Determinants of Health   Financial Resource Strain: Not on file  Food Insecurity: Not on file  Transportation Needs: Not on file  Physical Activity: Not on file  Stress: Not on file  Social Connections: Not on file  Intimate Partner Violence: Not on file    Review of Systems: Review of Systems  Constitutional: Positive for malaise/fatigue and weight loss. Negative for chills and fever.  HENT: Negative for hearing loss and tinnitus.   Eyes: Negative for pain and redness.  Respiratory: Positive for shortness of breath. Negative for cough.   Cardiovascular: Negative for chest pain and palpitations.  Gastrointestinal: Positive for heartburn and melena. Negative for abdominal pain, blood in stool, constipation, diarrhea, nausea and vomiting.  Genitourinary: Negative for flank pain and hematuria.  Musculoskeletal: Negative for falls and joint pain.  Skin: Negative for itching and rash.  Neurological: Negative for seizures and loss of consciousness.  Endo/Heme/Allergies: Negative for polydipsia. Does not bruise/bleed easily.  Psychiatric/Behavioral: Negative for substance abuse. The patient is not nervous/anxious.      Physical Exam: Vital signs: Vitals:   08/21/20 1545 08/21/20 1600  BP: 128/65 (!) 128/43  Pulse: 78 81  Resp: 16 14  Temp:    SpO2: 100% 99%     Physical Exam Vitals reviewed.  Constitutional:      General: He is not in acute distress. HENT:     Head: Normocephalic and atraumatic.     Nose: Nose normal. No congestion.     Mouth/Throat:     Mouth: Mucous membranes are moist.     Pharynx: Oropharynx is clear.  Eyes:      General: No scleral icterus.    Extraocular Movements: Extraocular movements intact.     Comments: Conjunctival pallor  Cardiovascular:     Rate and Rhythm: Normal rate and regular rhythm.     Pulses: Normal pulses.  Pulmonary:     Effort: Pulmonary effort is normal. No respiratory distress.     Breath sounds: Normal breath sounds.  Abdominal:     General: Bowel sounds are normal. There is no distension.     Palpations: Abdomen is soft. There is no mass.     Tenderness: There is no abdominal tenderness. There is no guarding or rebound.     Hernia: No hernia is present.  Musculoskeletal:        General:  No swelling or tenderness.     Cervical back: Normal range of motion and neck supple.  Skin:    General: Skin is warm and dry.  Neurological:     General: No focal deficit present.     Mental Status: He is alert and oriented to person, place, and time.  Psychiatric:        Mood and Affect: Mood normal.        Behavior: Behavior normal. Behavior is cooperative.      GI:  Lab Results: Recent Labs    08/21/20 1402  WBC 3.6*  HGB 5.8*  HCT 19.5*  PLT 376   BMET Recent Labs    08/21/20 1402  NA 135  K 4.0  CL 101  CO2 25  GLUCOSE 124*  BUN 11  CREATININE 0.92  CALCIUM 8.9   LFT Recent Labs    08/21/20 1402  PROT 6.4*  ALBUMIN 3.4*  AST 24  ALT 21  ALKPHOS 79  BILITOT 0.6   PT/INR No results for input(s): LABPROT, INR in the last 72 hours.   Studies/Results: No results found.  Impression: Suspected upper GI bleeding, subacute vs. chronic. Black, tarry stools x1 month, as well as dysphagia x1 month.  PUD versus gastritis versus esophagitis vs. mass.  Right-sided colonic bleeding is also possible, though less likely given patient's clinical presentation. -Hemoglobin 5.8 on arrival, pt currently receiving pRBCs -iron panel, ferritin pending -PT/INR pending -Patient currently stable with normal BP and heart rate  PAD, on Plavix, last dose  12/12  Family history (mother) of colon cancer.  Next colonoscopy due 09/2020.  Plan: EGD tomorrow.  I thoroughly discussed the procedure with the patient to include nature, alternatives (medical management/observation), benefits (possible diagnosis/treatment), and risks (including but not limited to bleeding, infection, perforation, anesthesia/cardiac and pulmonary complications).  Patient verbalized understanding and gave verbal consent to proceed with EGD.  Protonix 40 mg IV twice daily.  Clear liquid diet, NPO after midnight.  If EGD is negative, will prep patient for colonoscopy.  Otherwise, recommend outpatient colonoscopy once stable.  Continue to monitor H&H with transfusion as needed to maintain Hgb >7.  [facility] will follow.   LOS: 0 days   Salley Slaughter  PA-C 08/21/2020, 4:11 PM  Contact #  [phone]

## 2020-08-21 NOTE — H&P (Addendum)
History and Physical    Donald Wade JQB:[id number] DOB: Oct 19, 1955 DOA: 08/21/2020  PCP: Berkley Harvey, NP   Patient coming from: Home  I have personally briefly reviewed patient's old medical records in [place]  Chief Complaint: Black tarry stools and exertional dyspnea.  HPI: Donald Wade is a 64 y.o. male with medical history significant of PAD, hyperlipidemia and hypertension came to ED at the advice of his vascular surgeon. Apparently patient went for angiography/angioplasty of his lower extremities by vascular surgery and found to have hemoglobin of 6, procedure was canceled and he was advised to go to ED for further investigation and management.  Patient has an history of black tarry stool for the past 40-month, thought to be due to his medications.  Also complaining of worsening GERD and dyspepsia for the same duration.  Worsening exertional dyspnea but denies chest pain, orthopnea or PND.  Patient was started on Plavix and cilostazol by vascular surgery approximately 6 months ago, per patient he developed dyspepsia and GERD since then.  Denies any hematochezia.  Denies any regular use of NSAID. Had regular colonoscopies, last colonoscopy was 5 years ago and it was normal.  Had a family history of colon cancer in mother.  Next colonoscopy is scheduled in January 2022.  Patient denies any abdominal pain, nausea or vomiting. No other recent illness or sick contacts.  No urinary symptoms.  No diarrhea or constipation.  ED Course: Hemodynamically stable, labs positive for hemoglobin of 5.8.  2 units of PRBCs was ordered and GI was consulted.  Going for EGD tomorrow.  Review of Systems: As per HPI otherwise 10 point review of systems negative.   Past Medical History:  Diagnosis Date  . Heart murmur   . Hyperlipidemia   . Hypertension     Past Surgical History:  Procedure Laterality Date  . APPENDECTOMY  1980  . LEFT HEART CATH AND CORONARY ANGIOGRAPHY N/A  11/23/2019   Procedure: LEFT HEART CATH AND CORONARY ANGIOGRAPHY;  Surgeon: Nigel Mormon, MD;  Location: [facility];  Service: Cardiovascular;  Laterality: N/A;     reports that he has never smoked. He has never used smokeless tobacco. He reports current alcohol use. He reports that he does not use drugs.  Allergies  Allergen Reactions  . Bee Venom Anaphylaxis  . Penicillin G Anaphylaxis, Hives and Swelling    Did it involve swelling of the face/tongue/throat, SOB, or low BP? Yes Did it involve sudden or severe rash/hives, skin peeling, or any reaction on the inside of your mouth or nose? Yes Did you need to seek medical attention at a hospital or doctor's office? Yes When did it last happen?1990 If all above answers are "NO", may proceed with cephalosporin use.    Family History  Problem Relation Age of Onset  . Heart disease Mother   . Atrial fibrillation Mother     Prior to Admission medications   Medication Sig Start Date End Date Taking? Authorizing Provider  acetaminophen (TYLENOL) 325 MG tablet Take 650 mg by mouth every 6 (six) hours as needed for mild pain.   Yes [provider]  atorvastatin (LIPITOR) 40 MG tablet Take 1 tablet (40 mg total) by mouth daily. 12/03/19  Yes Patwardhan, Manish J, MD  buPROPion (WELLBUTRIN XL) 150 MG 24 hr tablet Take 150 mg by mouth daily.  12/17/16  Yes [provider]  cilostazol (PLETAL) 50 MG tablet Take 1 tablet (50 mg total) by mouth  2 (two) times daily. 06/09/20  Yes Patwardhan, Manish J, MD  clopidogrel (PLAVIX) 75 MG tablet Take 1 tablet (75 mg total) by mouth daily. 12/03/19  Yes Patwardhan, Manish J, MD  EPINEPHrine 0.3 mg/0.3 mL IJ SOAJ injection Inject 0.3 mg into the skin as needed for anaphylaxis (call 911). 05/01/16  Yes [provider]  fexofenadine (ALLEGRA) 180 MG tablet Take 180 mg by mouth daily as needed for allergies. 11/30/09  Yes [provider]  metoprolol succinate  (TOPROL XL) 50 MG 24 hr tablet Take 1 tablet (50 mg total) by mouth daily. Patient taking differently: Take 25 mg by mouth daily. 03/06/20  Yes Patwardhan, Manish J, MD  Multiple Vitamin (MULTI-VITAMIN) tablet Take 1 tablet by mouth daily.   Yes [provider]  Olmesartan-amLODIPine-HCTZ 40-5-12.5 MG TABS Take 1 tablet by mouth daily. 12/17/16  Yes [provider]  Omega-3 1000 MG CAPS Take 1,000 mg by mouth once a week.   Yes [provider]  vitamin B-12 (CYANOCOBALAMIN) 1000 MCG tablet Take 1,000 mcg by mouth daily.   Yes [provider]    Physical Exam: Vitals:   08/21/20 1659 08/21/20 1715 08/21/20 1730 08/21/20 1745  BP: (!) 149/81 140/71 120/71 127/82  Pulse: 90 86 80 83  Resp: 15 15 14 14   Temp: 99.7 F (37.6 C)  98.8 F (37.1 C)   TempSrc: Oral  Oral   SpO2: 100% 100% 100% 100%    General: Vital signs reviewed.  Patient is well-developed and well-nourished, in no acute distress and cooperative with exam.  Head: Normocephalic and atraumatic. Eyes: EOMI, conjunctivae normal, no scleral icterus.  ENMT: Mucous membranes are moist. Neck: Supple, trachea midline, normal ROM, Cardiovascular: RRR, S1 normal, S2 normal, no murmurs, gallops, or rubs. Pulmonary/Chest: Clear to auscultation bilaterally, no wheezes, rales, or rhonchi. Abdominal: Soft, non-tender, non-distended, BS +, Extremities: No lower extremity edema bilaterally,  pulses symmetric and intact bilaterally. No cyanosis or clubbing. Neurological: A&O x3, Strength is normal and symmetric bilaterally, cranial nerve II-XII are grossly intact, no focal motor deficit, sensory intact to light touch bilaterally.  Skin: Warm, dry and intact. No rashes or erythema. Psychiatric: Normal mood and affect. speech and behavior is normal. Cognition and memory are normal.   Labs on Admission: I have personally reviewed following labs and imaging studies  CBC: Recent Labs  Lab 08/21/20 1402  WBC  3.6*  HGB 5.8*  HCT 19.5*  MCV 85.9  PLT 419   Basic Metabolic Panel: Recent Labs  Lab 08/21/20 1402  NA 135  K 4.0  CL 101  CO2 25  GLUCOSE 124*  BUN 11  CREATININE 0.92  CALCIUM 8.9   GFR: CrCl cannot be calculated (Unknown ideal weight.). Liver Function Tests: Recent Labs  Lab 08/21/20 1402  AST 24  ALT 21  ALKPHOS 79  BILITOT 0.6  PROT 6.4*  ALBUMIN 3.4*   No results for input(s): LIPASE, AMYLASE in the last 168 hours. No results for input(s): AMMONIA in the last 168 hours. Coagulation Profile: No results for input(s): INR, PROTIME in the last 168 hours. Cardiac Enzymes: No results for input(s): CKTOTAL, CKMB, CKMBINDEX, TROPONINI in the last 168 hours. BNP (last 3 results) No results for input(s): PROBNP in the last 8760 hours. HbA1C: No results for input(s): HGBA1C in the last 72 hours. CBG: No results for input(s): GLUCAP in the last 168 hours. Lipid Profile: No results for input(s): CHOL, HDL, LDLCALC, TRIG, CHOLHDL, LDLDIRECT in the last 72 hours.  Thyroid Function Tests: No results for input(s): TSH, T4TOTAL, FREET4, T3FREE, THYROIDAB in the last 72 hours. Anemia Panel: No results for input(s): VITAMINB12, FOLATE, FERRITIN, TIBC, IRON, RETICCTPCT in the last 72 hours. Urine analysis: No results found for: COLORURINE, APPEARANCEUR, LABSPEC, PHURINE, GLUCOSEU, HGBUR, BILIRUBINUR, KETONESUR, PROTEINUR, UROBILINOGEN, NITRITE, LEUKOCYTESUR  Radiological Exams on Admission: No results found.  Assessment/Plan Active Problems:   Symptomatic anemia   Symptomatic anemia.  Most likely secondary to upper GI bleed.  Patient was on Plavix and cilostazol.  Complaining of worsening GERD and dyspepsia.  GI was consulted and patient is going for EGD tomorrow. 2 units of PRBC was ordered. -Monitor CBC. -Anemia panel. -Protonix twice daily.  PAD.  Patient has an history of PAD in bilateral lower extremities, he followed up with vascular surgery as an outpatient  and scheduled a angioplasty today which was canceled due to low hemoglobin. -Holding home dose of Plavix and cilostazol at this time. -Patient will follow up with vascular surgery as an outpatient.  Hypertension. -Continue home meds   DVT prophylaxis: SCDs Code Status: Full code Family Communication: Wife was updated at bedside Disposition Plan: Home Consults called: GI Admission status: Observation.   Lorella Nimrod MD [facility]  If 7PM-7AM, please contact night-coverage [email or web address]  08/21/2020, 6:15 PM   This record has been created using Dragon voice recognition software. Errors have been sought and corrected,but may not always be located. Such creation errors do not reflect on the standard of care.

## 2020-08-21 NOTE — ED Notes (Signed)
Notified PA Layden of critical Hgb 5.8

## 2020-08-21 NOTE — ED Provider Notes (Signed)
[facility] EMERGENCY DEPARTMENT Provider Note   CSN: [id number] Arrival date & time: 08/21/20  1251     History Chief Complaint  Patient presents with  . Abnormal Lab    Donald Wade is a 64 y.o. male past medical history of hyperlipidemia, hypertension who presents for evaluation of low hemoglobin.  Patient reports that he was at the vascular office today to have a procedure done.  He had blood work drawn a few days ago.  During the review of the blood work, they noted his hemoglobin to be 6 and was sent to the emergency department for further evaluation.  Patient reports that for about 2 months, he has noticed some black tarry stools.  He states that he was started on Plavix that he thought that was contributing.  He never sought evaluation for this.  He has not had any abdominal pain.  He does report he has had some weight loss in the last year or so and states he lost about 10 pounds.  He has had some reflux which he feels like is contributing to him not wanting to eat which he thought was contributing to the weight loss.  His last colonoscopy was about 5 years ago.  He gets them every 5 years because his mother had a history of colon cancer.  He has not had his most recent one.  He denies any chest pain or shortness of breath.  He has noticed over the last few days, he has gotten progressively weaker and lightheaded.  He states he feels like when he starts doing stuff, he has no energy.  Denies any fevers, chest pain, difficulty breathing, abdominal pain, nausea/vomiting.   The history is provided by the patient.       Past Medical History:  Diagnosis Date  . Heart murmur   . Hyperlipidemia   . Hypertension     Patient Active Problem List   Diagnosis Date Noted  . Hypertriglyceridemia 06/08/2020  . Mixed hyperlipidemia 03/06/2020  . Elevated coronary artery calcium score 11/12/2019  . Family history of early CAD 10/13/2019  . Exertional dyspnea  10/13/2019  . Essential hypertension 10/13/2019  . PAD (peripheral artery disease) ([place]) 10/13/2019    Past Surgical History:  Procedure Laterality Date  . APPENDECTOMY  1980  . LEFT HEART CATH AND CORONARY ANGIOGRAPHY N/A 11/23/2019   Procedure: LEFT HEART CATH AND CORONARY ANGIOGRAPHY;  Surgeon: Nigel Mormon, MD;  Location: [facility];  Service: Cardiovascular;  Laterality: N/A;       Family History  Problem Relation Age of Onset  . Heart disease Mother   . Atrial fibrillation Mother     Social History   Tobacco Use  . Smoking status: Never Smoker  . Smokeless tobacco: Never Used  Vaping Use  . Vaping Use: Never used  Substance Use Topics  . Alcohol use: Yes    Comment: 2 beers daily  . Drug use: Never    Home Medications Prior to Admission medications   Medication Sig Start Date End Date Taking? Authorizing Provider  atorvastatin (LIPITOR) 40 MG tablet Take 1 tablet (40 mg total) by mouth daily. 12/03/19   Patwardhan, Reynold Bowen, MD  buPROPion (WELLBUTRIN XL) 150 MG 24 hr tablet Take 150 mg by mouth daily.  12/17/16   [provider]  cilostazol (PLETAL) 50 MG tablet Take 1 tablet (50 mg total) by mouth 2 (two) times daily. 06/09/20   Patwardhan, Reynold Bowen, MD  clopidogrel (  PLAVIX) 75 MG tablet Take 1 tablet (75 mg total) by mouth daily. 12/03/19   Patwardhan, Manish J, MD  EPINEPHrine 0.3 mg/0.3 mL IJ SOAJ injection Inject 0.3 mg into the skin as needed for anaphylaxis.  05/01/16   [provider]  fexofenadine (ALLEGRA) 180 MG tablet Take 180 mg by mouth daily as needed.  11/30/09   [provider]  metoprolol succinate (TOPROL XL) 50 MG 24 hr tablet Take 1 tablet (50 mg total) by mouth daily. 03/06/20   Patwardhan, Reynold Bowen, MD  Multiple Vitamin (MULTI-VITAMIN) tablet Take 1 tablet by mouth daily.    [provider]  niacin 100 MG tablet Take 100 mg by mouth daily as needed (acne).  06/28/19   [provider]   Olmesartan-amLODIPine-HCTZ 40-5-12.5 MG TABS Take 1 tablet by mouth daily. 12/17/16   [provider]  Omega-3 1000 MG CAPS Take 1,000 mg by mouth 3 (three) times a week.     [provider]  vitamin B-12 (CYANOCOBALAMIN) 1000 MCG tablet Take 1,000 mcg by mouth daily.    [provider]    Allergies    Bee venom and Penicillin g  Review of Systems   Review of Systems  Constitutional: Positive for fatigue. Negative for fever.  Respiratory: Negative for cough and shortness of breath.   Cardiovascular: Negative for chest pain.  Gastrointestinal: Positive for blood in stool. Negative for abdominal pain, nausea and vomiting.  Genitourinary: Negative for dysuria and hematuria.  Neurological: Negative for headaches.  All other systems reviewed and are negative.   Physical Exam Updated Vital Signs BP (!) 108/59   Pulse 79   Temp 98.8 F (37.1 C) (Oral)   Resp (!) 9   SpO2 100%   Physical Exam Vitals and nursing note reviewed.  Constitutional:      Appearance: Normal appearance. He is well-developed and well-nourished.  HENT:     Head: Normocephalic and atraumatic.     Mouth/Throat:     Mouth: Oropharynx is clear and moist and mucous membranes are normal.  Eyes:     General: Lids are normal.     Extraocular Movements: EOM normal.     Conjunctiva/sclera: Conjunctivae normal.     Pupils: Pupils are equal, round, and reactive to light.     Comments: Pale conjunctive a bilaterally.  Cardiovascular:     Rate and Rhythm: Normal rate and regular rhythm.     Pulses: Normal pulses.     Heart sounds: Normal heart sounds. No murmur heard. No friction rub. No gallop.   Pulmonary:     Effort: Pulmonary effort is normal.     Breath sounds: Normal breath sounds.  Abdominal:     Palpations: Abdomen is soft. Abdomen is not rigid.     Tenderness: There is no abdominal tenderness. There is no guarding.     Comments: Abdomen is soft, non-distended, non-tender. No  rigidity, No guarding. No peritoneal signs.  Genitourinary:    Comments: The exam was performed with a chaperone present Lenna Sciara, RN). Digital Rectal Exam reveals sphincter with good tone. No external hemorrhoids. No masses or fissures. Stool color is brown with no overt blood. Musculoskeletal:        General: Normal range of motion.     Cervical back: Full passive range of motion without pain.  Skin:    General: Skin is warm and dry.     Capillary Refill: Capillary refill takes less than 2 seconds.  Neurological:  Mental Status: He is alert and oriented to person, place, and time.  Psychiatric:        Mood and Affect: Mood and affect normal.        Speech: Speech normal.     ED Results / Procedures / Treatments   Labs (all labs ordered are listed, but only abnormal results are displayed) Labs Reviewed  COMPREHENSIVE METABOLIC PANEL - Abnormal; Notable for the following components:      Result Value   Glucose, Bld 124 (*)    Total Protein 6.4 (*)    Albumin 3.4 (*)    All other components within normal limits  CBC - Abnormal; Notable for the following components:   WBC 3.6 (*)    RBC 2.27 (*)    Hemoglobin 5.8 (*)    HCT 19.5 (*)    MCH 25.6 (*)    MCHC 29.7 (*)    RDW 17.4 (*)    All other components within normal limits  OCCULT BLOOD X 1 CARD TO LAB, STOOL  VITAMIN B12  FOLATE  IRON AND TIBC  FERRITIN  RETICULOCYTES  POC OCCULT BLOOD, ED  TYPE AND SCREEN  PREPARE RBC (CROSSMATCH)  ABO/RH    EKG None  Radiology No results found.  Procedures .Critical Care Performed by: Volanda Napoleon, PA-C Authorized by: Volanda Napoleon, PA-C   Critical care provider statement:    Critical care time (minutes):  35   Critical care was necessary to treat or prevent imminent or life-threatening deterioration of the following conditions: anemia.   Critical care was time spent personally by me on the following activities:  Discussions with consultants, evaluation of  patient's response to treatment, examination of patient, ordering and performing treatments and interventions, ordering and review of laboratory studies, ordering and review of radiographic studies, pulse oximetry, re-evaluation of patient's condition, obtaining history from patient or surrogate and review of old charts   (including critical care time)  Medications Ordered in ED Medications  0.9 %  sodium chloride infusion (has no administration in time range)    ED Course  I have reviewed the triage vital signs and the nursing notes.  Pertinent labs & imaging results that were available during my care of the patient were reviewed by me and considered in my medical decision making (see chart for details).    MDM Rules/Calculators/A&P                          64 year old male who presents for evaluation of abnormal hemoglobin.  He was at a vascular procedure today and was noted to have a hemoglobin of 6.0.  Was sent to the ED for further evaluation.  On initially arrival, he is afebrile, nontoxic-appearing.  Vital signs are stable.  No abdominal exam.  He does tell me he has had some black tarry stools x2 months.  He thought it was due to his Plavix.  Never got it evaluated.  Digital rectal exam shows no evidence of gross blood.  Will obtain fecal occult as well as repeat his labs.  Fecal occult negative. CBC shows leukopenia of 3.6, Hgb is 5.8. Plts are 376. Anemia panel sent. Will transfuse.   Discussed results with patient.  He has gotten colonoscopies with [facility] previously.  I have just secure message Dr. Michail Sermon [facility]).  GI will plan to consult.  We will plan for admission.  Attempted admission with family practice but they do not follow  ith patient's PCP. Will consult unassigned medicine.   Patient signed out to Dr. Lyndel Safe.   Portions of this note were generated with [employer]. Dictation errors may occur despite best attempts at proofreading.   Final  Clinical Impression(s) / ED Diagnoses Final diagnoses:  Anemia, unspecified type    Rx / DC Orders ED Discharge Orders    None       Desma Mcgregor 08/22/20 1515    Long, Wonda Olds, MD 08/23/20 1235

## 2020-08-21 NOTE — ED Triage Notes (Signed)
Pt was supposed to have some vascular surgery today but his hgb was 6.0 so procedure was postponed. Pt endorses dark stool x 2 months and generalized weakness. On plavix until last night, did not take in preparation for procedure today. Also reports only taking half of his metoprolol dose d/t hypotension at home.

## 2020-08-22 ENCOUNTER — Encounter (HOSPITAL_COMMUNITY): Payer: Self-pay | Admitting: Internal Medicine

## 2020-08-22 ENCOUNTER — Observation Stay (HOSPITAL_COMMUNITY): Payer: No Typology Code available for payment source | Admitting: Anesthesiology

## 2020-08-22 ENCOUNTER — Encounter (HOSPITAL_COMMUNITY)
Admission: EM | Disposition: A | Discharge status: Home / Self Care | Payer: Self-pay | Source: Home / Self Care | Attending: Internal Medicine

## 2020-08-22 ENCOUNTER — Inpatient Hospital Stay (HOSPITAL_COMMUNITY): Payer: No Typology Code available for payment source

## 2020-08-22 ENCOUNTER — Other Ambulatory Visit: Payer: Self-pay

## 2020-08-22 DIAGNOSIS — I10 Essential (primary) hypertension: Secondary | ICD-10-CM | POA: Diagnosis present

## 2020-08-22 DIAGNOSIS — Z91030 Bee allergy status: Secondary | ICD-10-CM | POA: Diagnosis not present

## 2020-08-22 DIAGNOSIS — Z880 Allergy status to penicillin: Secondary | ICD-10-CM | POA: Diagnosis not present

## 2020-08-22 DIAGNOSIS — R011 Cardiac murmur, unspecified: Secondary | ICD-10-CM | POA: Diagnosis present

## 2020-08-22 DIAGNOSIS — C160 Malignant neoplasm of cardia: Secondary | ICD-10-CM | POA: Diagnosis present

## 2020-08-22 DIAGNOSIS — Z79899 Other long term (current) drug therapy: Secondary | ICD-10-CM | POA: Diagnosis not present

## 2020-08-22 DIAGNOSIS — R0600 Dyspnea, unspecified: Secondary | ICD-10-CM | POA: Diagnosis not present

## 2020-08-22 DIAGNOSIS — D649 Anemia, unspecified: Secondary | ICD-10-CM

## 2020-08-22 DIAGNOSIS — Z7902 Long term (current) use of antithrombotics/antiplatelets: Secondary | ICD-10-CM | POA: Diagnosis not present

## 2020-08-22 DIAGNOSIS — D62 Acute posthemorrhagic anemia: Secondary | ICD-10-CM | POA: Diagnosis present

## 2020-08-22 DIAGNOSIS — Z800 Family history of malignant neoplasm of digestive organs: Secondary | ICD-10-CM | POA: Diagnosis not present

## 2020-08-22 DIAGNOSIS — E782 Mixed hyperlipidemia: Secondary | ICD-10-CM | POA: Diagnosis present

## 2020-08-22 DIAGNOSIS — R1310 Dysphagia, unspecified: Secondary | ICD-10-CM | POA: Diagnosis present

## 2020-08-22 DIAGNOSIS — Z9049 Acquired absence of other specified parts of digestive tract: Secondary | ICD-10-CM | POA: Diagnosis not present

## 2020-08-22 DIAGNOSIS — Z20822 Contact with and (suspected) exposure to covid-19: Secondary | ICD-10-CM | POA: Diagnosis present

## 2020-08-22 DIAGNOSIS — I739 Peripheral vascular disease, unspecified: Secondary | ICD-10-CM

## 2020-08-22 DIAGNOSIS — K922 Gastrointestinal hemorrhage, unspecified: Secondary | ICD-10-CM | POA: Diagnosis present

## 2020-08-22 DIAGNOSIS — K219 Gastro-esophageal reflux disease without esophagitis: Secondary | ICD-10-CM | POA: Diagnosis present

## 2020-08-22 DIAGNOSIS — D509 Iron deficiency anemia, unspecified: Secondary | ICD-10-CM | POA: Diagnosis present

## 2020-08-22 DIAGNOSIS — Z8249 Family history of ischemic heart disease and other diseases of the circulatory system: Secondary | ICD-10-CM | POA: Diagnosis not present

## 2020-08-22 DIAGNOSIS — R0609 Other forms of dyspnea: Secondary | ICD-10-CM | POA: Diagnosis present

## 2020-08-22 DIAGNOSIS — R195 Other fecal abnormalities: Secondary | ICD-10-CM | POA: Diagnosis present

## 2020-08-22 HISTORY — DX: Anemia, unspecified: D64.9

## 2020-08-22 HISTORY — PX: ESOPHAGOGASTRODUODENOSCOPY: SHX1529

## 2020-08-22 HISTORY — PX: ESOPHAGOGASTRODUODENOSCOPY: SHX5428

## 2020-08-22 HISTORY — PX: BIOPSY: SHX5522

## 2020-08-22 LAB — CBC WITH DIFFERENTIAL/PLATELET
Abs Immature Granulocytes: 0.01 10*3/uL (ref 0.00–0.07)
Basophils Absolute: 0.1 10*3/uL (ref 0.0–0.1)
Basophils Relative: 1 %
Eosinophils Absolute: 0 10*3/uL (ref 0.0–0.5)
Eosinophils Relative: 1 %
HCT: 24 % — ABNORMAL LOW (ref 39.0–52.0)
Hemoglobin: 7.4 g/dL — ABNORMAL LOW (ref 13.0–17.0)
Immature Granulocytes: 0 %
Lymphocytes Relative: 23 %
Lymphs Abs: 0.9 10*3/uL (ref 0.7–4.0)
MCH: 26.4 pg (ref 26.0–34.0)
MCHC: 30.8 g/dL (ref 30.0–36.0)
MCV: 85.7 fL (ref 80.0–100.0)
Monocytes Absolute: 0.5 10*3/uL (ref 0.1–1.0)
Monocytes Relative: 14 %
Neutro Abs: 2.3 10*3/uL (ref 1.7–7.7)
Neutrophils Relative %: 61 %
Platelets: 298 10*3/uL (ref 150–400)
RBC: 2.8 MIL/uL — ABNORMAL LOW (ref 4.22–5.81)
RDW: 16 % — ABNORMAL HIGH (ref 11.5–15.5)
WBC: 3.7 10*3/uL — ABNORMAL LOW (ref 4.0–10.5)
nRBC: 0 % (ref 0.0–0.2)

## 2020-08-22 LAB — TYPE AND SCREEN
ABO/RH(D): O POS
Antibody Screen: NEGATIVE
Unit division: 0
Unit division: 0

## 2020-08-22 LAB — RETICULOCYTES
Immature Retic Fract: 25.8 % — ABNORMAL HIGH (ref 2.3–15.9)
RBC.: 2.87 MIL/uL — ABNORMAL LOW (ref 4.22–5.81)
Retic Count, Absolute: 73.2 10*3/uL (ref 19.0–186.0)
Retic Ct Pct: 2.6 % (ref 0.4–3.1)

## 2020-08-22 LAB — BPAM RBC
Blood Product Expiration Date: 202201142359
Blood Product Expiration Date: 202201142359
ISSUE DATE / TIME: 202112131627
ISSUE DATE / TIME: 202112131957
Unit Type and Rh: 5100
Unit Type and Rh: 5100

## 2020-08-22 LAB — PROTIME-INR
INR: 1 (ref 0.8–1.2)
INR: 1 (ref 0.8–1.2)
Prothrombin Time: 13.2 seconds (ref 11.4–15.2)
Prothrombin Time: 13.2 seconds (ref 11.4–15.2)

## 2020-08-22 LAB — COMPREHENSIVE METABOLIC PANEL
ALT: 20 U/L (ref 0–44)
AST: 23 U/L (ref 15–41)
Albumin: 3.2 g/dL — ABNORMAL LOW (ref 3.5–5.0)
Alkaline Phosphatase: 72 U/L (ref 38–126)
Anion gap: 8 (ref 5–15)
BUN: 9 mg/dL (ref 8–23)
CO2: 23 mmol/L (ref 22–32)
Calcium: 8.5 mg/dL — ABNORMAL LOW (ref 8.9–10.3)
Chloride: 102 mmol/L (ref 98–111)
Creatinine, Ser: 0.77 mg/dL (ref 0.61–1.24)
GFR, Estimated: >60 mL/min (ref >60)
Glucose, Bld: 114 mg/dL — ABNORMAL HIGH (ref 70–99)
Potassium: 3.6 mmol/L (ref 3.5–5.1)
Sodium: 133 mmol/L — ABNORMAL LOW (ref 135–145)
Total Bilirubin: 1 mg/dL (ref 0.3–1.2)
Total Protein: 5.9 g/dL — ABNORMAL LOW (ref 6.5–8.1)

## 2020-08-22 LAB — APTT: aPTT: 33 seconds (ref 24–36)

## 2020-08-22 SURGERY — EGD (ESOPHAGOGASTRODUODENOSCOPY)
Anesthesia: Monitor Anesthesia Care

## 2020-08-22 MED ORDER — PROPOFOL 500 MG/50ML IV EMUL
INTRAVENOUS | Status: DC | PRN
  Administered 2020-08-22: 135 ug/kg/min via INTRAVENOUS
  Administered 2020-08-22: 100 ug/kg/min via INTRAVENOUS

## 2020-08-22 MED ORDER — IOHEXOL 300 MG/ML  SOLN
100.0000 mL | Freq: Once | INTRAMUSCULAR | Status: AC | PRN
  Administered 2020-08-22: 100 mL via INTRAVENOUS

## 2020-08-22 MED ORDER — OMEGA-3-ACID ETHYL ESTERS 1 G PO CAPS
1.0000 g | ORAL_CAPSULE | ORAL | Status: DC
  Administered 2020-08-22: 16:00:00 1 g via ORAL
  Filled 2020-08-22: qty 1

## 2020-08-22 MED ORDER — ACETAMINOPHEN 325 MG PO TABS: 650.0000 mg | ORAL_TABLET | Freq: Four times a day (QID) | ORAL | Status: DC | PRN

## 2020-08-22 MED ORDER — SODIUM CHLORIDE 0.9 % IV SOLN: INTRAVENOUS | Status: DC | PRN

## 2020-08-22 MED ORDER — VITAMIN B-12 1000 MCG PO TABS
1000.0000 ug | ORAL_TABLET | Freq: Every day | ORAL | Status: DC
  Administered 2020-08-22 – 2020-08-23 (×2): 1000 ug via ORAL
  Filled 2020-08-22 (×2): qty 1

## 2020-08-22 MED ORDER — ADULT MULTIVITAMIN W/MINERALS CH
1.0000 | ORAL_TABLET | Freq: Every day | ORAL | Status: DC
  Administered 2020-08-22 – 2020-08-23 (×2): 1 via ORAL
  Filled 2020-08-22 (×2): qty 1

## 2020-08-22 MED ORDER — PANTOPRAZOLE SODIUM 40 MG PO TBEC
40.0000 mg | DELAYED_RELEASE_TABLET | Freq: Two times a day (BID) | ORAL | Status: DC
  Administered 2020-08-22 – 2020-08-23 (×3): 40 mg via ORAL
  Filled 2020-08-22 (×3): qty 1

## 2020-08-22 MED ORDER — EPINEPHRINE 0.3 MG/0.3ML IJ SOAJ
0.3000 mg | INTRAMUSCULAR | Status: DC | PRN
  Filled 2020-08-22: qty 0.6

## 2020-08-22 MED ORDER — PROPOFOL 10 MG/ML IV BOLUS
INTRAVENOUS | Status: DC | PRN
  Administered 2020-08-22 (×4): 20 mg via INTRAVENOUS

## 2020-08-22 MED ORDER — OMEGA-3 1000 MG PO CAPS: 1000.0000 mg | ORAL_CAPSULE | ORAL | Status: DC

## 2020-08-22 MED ORDER — PHENYLEPHRINE 40 MCG/ML (10ML) SYRINGE FOR IV PUSH (FOR BLOOD PRESSURE SUPPORT)
PREFILLED_SYRINGE | INTRAVENOUS | Status: DC | PRN
  Administered 2020-08-22: 80 ug via INTRAVENOUS
  Administered 2020-08-22: 120 ug via INTRAVENOUS
  Administered 2020-08-22: 80 ug via INTRAVENOUS
  Administered 2020-08-22: 120 ug via INTRAVENOUS

## 2020-08-22 MED ORDER — SODIUM CHLORIDE 0.9 % IV SOLN: INTRAVENOUS | Status: DC

## 2020-08-22 MED ORDER — LORATADINE 10 MG PO TABS
10.0000 mg | ORAL_TABLET | Freq: Every day | ORAL | Status: DC
  Administered 2020-08-22: 16:00:00 10 mg via ORAL
  Filled 2020-08-22 (×2): qty 1

## 2020-08-22 MED ORDER — ENSURE ENLIVE PO LIQD
237.0000 mL | Freq: Two times a day (BID) | ORAL | Status: DC
  Administered 2020-08-22 – 2020-08-23 (×2): 237 mL via ORAL
  Filled 2020-08-22 (×2): qty 237

## 2020-08-22 NOTE — Progress Notes (Signed)
PROGRESS NOTE  Donald Wade AVW:[id number] DOB: 1955/11/11 DOA: 08/21/2020 PCP: Berkley Harvey, NP   LOS: 0 days   Brief narrative: As per HPI,  Donald Wade is a 64 y.o. male with medical history significant of PAD, hyperlipidemia and hypertension had been to vascular surgery for angiography angioplasty of his lower extremity but he was noted to have hemoglobin of 6 so the procedure was canceled and was sent to the ED for further evaluation and treatment.  Of note, patient was on Plavix and cilostazol for his peripheral arterial disease.  Patient also complained of exertional dyspnea but denied any chest pain.  He had been having worsening GERD and dyspepsia with 4 pound weight loss.  Denied use of NSAIDs.  He did have colonoscopy in the past which was normal as per the patient.  Patient was then admitted to hospital because of significant anemia, PRBC transfusion, and for EGD.Marland Kitchen  Assessment/Plan:  Principal Problem:   Acute blood loss anemia Active Problems:   Exertional dyspnea   Essential hypertension   PAD (peripheral artery disease) (HCC)   Mixed hyperlipidemia   Symptomatic anemia  Symptomatic acute blood loss anemia.  likely secondary to upper GI bleed.  Patient was on Plavix and cilostazol.  Currently on hold.  Upper GI endoscopic evaluation today with findings of lower esophageal tumor, gastric cardia tumor, were highly suggestive of malignancy.  Status post biopsy.  Received 2 units of packed RBC this admission with appropriate increase in hemoglobin..  Continue Protonix.  Follow pathology.  GI recommends a CT scan of the chest and abdomen for staging.  Hemoglobin March 2021 was 13.5.  Might need oncology evaluation while in the hospital.  Will follow GI recommendations.  Check iron profile.  Trend CBC.  Will transfuse as necessary.  CBC Latest Ref Rng & Units 08/22/2020 08/21/2020 11/17/2019  WBC 4.0 - 10.5 K/uL 3.7(L) 3.6(L) 6.5  Hemoglobin 13.0 - 17.0 g/dL 7.4(L)  5.8(LL) 13.5  Hematocrit 39.0 - 52.0 % 24.0(L) 19.5(L) 39.0  Platelets 150 - 400 K/uL 298 376 259    History of PAD.    Was on Plavix and cilostazol.  Will hold for now due to significant anemia.  Was supposed to get vascular surgery evaluation with angiography which was canceled due to anemia.   Hypertension. On amlodipine and metoprolol.  We will continue.  Hyperlipidemia Continue Lipitor.  DVT prophylaxis: SCDs Start: 08/21/20 2031 SCDs Start: 08/21/20 1755   Code Status: Full code  Family Communication: Spoke with the patient's spouse at bedside.  Status is: Observation  The patient will require care spanning > 2 midnights and should be moved to inpatient because: IV treatments appropriate due to intensity of illness or inability to take PO, Inpatient level of care appropriate due to severity of illness and Further observation, CT scanning, GI input.  Need oncology input  Dispo: The patient is from: Home              Anticipated d/c is to: Home              Anticipated d/c date is: 1 to 2 days,              Patient currently is not medically stable to d/c.  Consultants:  GI  Procedures:  Upper GI endoscopy with biopsy on 08/22/2020.  Antibiotics:   None  Anti-infectives (From admission, onward)   None     Subjective: Today, patient was seen and examined at bedside.  Patient  was seen after EGD.  Denies any nausea vomiting shortness of breath cough fever.  Spouse at bedside.  Objective: Vitals:   08/22/20 1115 08/22/20 1130  BP: 111/75 115/67  Pulse: 67 70  Resp: 10 14  Temp:    SpO2: 100% 98%    Intake/Output Summary (Last 24 hours) at 08/22/2020 1152 Last data filed at 08/21/2020 2330 Gross per 24 hour  Intake 1001 ml  Output --  Net 1001 ml   There were no vitals filed for this visit. There is no height or weight on file to calculate BMI.   Physical Exam:  GENERAL: Patient is alert awake and oriented. Not in obvious distress. HENT:  Pallor noted.  Pupils equally reactive to light. Oral mucosa is moist NECK: is supple, no gross swelling noted. CHEST: Clear to auscultation. No crackles or wheezes.  Diminished breath sounds bilaterally. CVS: S1 and S2 heard, no murmur. Regular rate and rhythm.  ABDOMEN: Soft, non-tender, bowel sounds are present. EXTREMITIES: No edema. CNS: Cranial nerves are intact. No focal motor deficits. SKIN: warm and dry without rashes.  Data Review: I have personally reviewed the following laboratory data and studies,  CBC: Recent Labs  Lab 08/21/20 1402 08/22/20 0139  WBC 3.6* 3.7*  NEUTROABS  --  2.3  HGB 5.8* 7.4*  HCT 19.5* 24.0*  MCV 85.9 85.7  PLT 376 737   Basic Metabolic Panel: Recent Labs  Lab 08/21/20 1402 08/22/20 0139  NA 135 133*  K 4.0 3.6  CL 101 102  CO2 25 23  GLUCOSE 124* 114*  BUN 11 9  CREATININE 0.92 0.77  CALCIUM 8.9 8.5*   Liver Function Tests: Recent Labs  Lab 08/21/20 1402 08/22/20 0139  AST 24 23  ALT 21 20  ALKPHOS 79 72  BILITOT 0.6 1.0  PROT 6.4* 5.9*  ALBUMIN 3.4* 3.2*   No results for input(s): LIPASE, AMYLASE in the last 168 hours. No results for input(s): AMMONIA in the last 168 hours. Cardiac Enzymes: No results for input(s): CKTOTAL, CKMB, CKMBINDEX, TROPONINI in the last 168 hours. BNP (last 3 results) No results for input(s): BNP in the last 8760 hours.  ProBNP (last 3 results) No results for input(s): PROBNP in the last 8760 hours.  CBG: No results for input(s): GLUCAP in the last 168 hours. Recent Results (from the past 240 hour(s))  Resp Panel by RT-PCR (Flu A&B, Covid) Nasopharyngeal Swab     Status: None   Collection Time: 08/21/20  4:10 PM   Specimen: Nasopharyngeal Swab; Nasopharyngeal(NP) swabs in vial transport medium  Result Value Ref Range Status   SARS Coronavirus 2 by RT PCR NEGATIVE NEGATIVE Final    Comment: (NOTE) SARS-CoV-2 target nucleic acids are NOT DETECTED.  The SARS-CoV-2 RNA is generally  detectable in upper respiratory specimens during the acute phase of infection. The lowest concentration of SARS-CoV-2 viral copies this assay can detect is 138 copies/mL. A negative result does not preclude SARS-Cov-2 infection and should not be used as the sole basis for treatment or other patient management decisions. A negative result may occur with  improper specimen collection/handling, submission of specimen other than nasopharyngeal swab, presence of viral mutation(s) within the areas targeted by this assay, and inadequate number of viral copies(<138 copies/mL). A negative result must be combined with clinical observations, patient history, and epidemiological information. The expected result is Negative.  Fact Sheet for Patients:  EntrepreneurPulse.com.au  Fact Sheet for Healthcare Providers:  IncredibleEmployment.be  This test is no t yet  approved or cleared by the [place] and  has been authorized for detection and/or diagnosis of SARS-CoV-2 by FDA under an Emergency Use Authorization (EUA). This EUA will remain  in effect (meaning this test can be used) for the duration of the COVID-19 declaration under Section 564(b)(1) of the Act, 21 U.S.C.section 360bbb-3(b)(1), unless the authorization is terminated  or revoked sooner.       Influenza A by PCR NEGATIVE NEGATIVE Final   Influenza B by PCR NEGATIVE NEGATIVE Final    Comment: (NOTE) The Xpert Xpress SARS-CoV-2/FLU/RSV plus assay is intended as an aid in the diagnosis of influenza from Nasopharyngeal swab specimens and should not be used as a sole basis for treatment. Nasal washings and aspirates are unacceptable for Xpert Xpress SARS-CoV-2/FLU/RSV testing.  Fact Sheet for Patients: EntrepreneurPulse.com.au  Fact Sheet for Healthcare Providers: IncredibleEmployment.be  This test is not yet approved or cleared by the [place] FDA  and has been authorized for detection and/or diagnosis of SARS-CoV-2 by FDA under an Emergency Use Authorization (EUA). This EUA will remain in effect (meaning this test can be used) for the duration of the COVID-19 declaration under Section 564(b)(1) of the Act, 21 U.S.C. section 360bbb-3(b)(1), unless the authorization is terminated or revoked.  Performed at [facility], [place] [address], [place], [place] [address]      Studies: No results found.   Flora Lipps, MD  [facility] 08/22/2020  If 7PM-7AM, please contact night-coverage

## 2020-08-22 NOTE — Plan of Care (Signed)
Pt understanding of plan of care 

## 2020-08-22 NOTE — Anesthesia Procedure Notes (Signed)
Procedure Name: MAC Date/Time: 08/22/2020 12:24 PM Performed by: Harden Mo, CRNA Pre-anesthesia Checklist: Patient identified, Emergency Drugs available, Suction available and Patient being monitored Patient Re-evaluated:Patient Re-evaluated prior to induction Oxygen Delivery Method: Nasal cannula Preoxygenation: Pre-oxygenation with 100% oxygen Induction Type: IV induction Placement Confirmation: positive ETCO2 and breath sounds checked- equal and bilateral Dental Injury: Teeth and Oropharynx as per pre-operative assessment

## 2020-08-22 NOTE — Transfer of Care (Addendum)
Immediate Anesthesia Transfer of Care Note  Patient: Donald Wade  Procedure(s) Performed: ESOPHAGOGASTRODUODENOSCOPY (EGD) (N/A ) BIOPSY  Patient Location: Endoscopy Unit  Anesthesia Type:MAC  Level of Consciousness: awake and alert   Airway & Oxygen Therapy: Patient Spontanous Breathing  Post-op Assessment: Report given to RN and Post -op Vital signs reviewed and stable  Post vital signs: Reviewed and stable  Last Vitals:  Vitals Value Taken Time  BP 114/87 08/22/20 1335  Temp 36.6 C 08/22/20 1335  Pulse 74 08/22/20 1335  Resp 14 08/22/20 1335  SpO2 100 % 08/22/20 1335    Last Pain:  Vitals:   08/22/20 1335  TempSrc: Oral  PainSc:          Complications: No complications documented.

## 2020-08-22 NOTE — Op Note (Signed)
[facility] Patient Name: Donald Wade Procedure Date : 08/22/2020 MRN: [id number] Attending MD: Clarene Essex , MD Date of Birth: 1956-08-01 CSN: [id number] Age: 64 Admit Type: Inpatient Procedure:                Upper GI endoscopy Indications:              Iron deficiency anemia secondary to chronic blood                            loss, Melena Providers:                Clarene Essex, MD, Kary Kos RN, RN, Fransico Setters                            Mbum[place], Technician Referring MD:              Medicines:                Propofol total dose 086 mg IV Complications:            No immediate complications. Estimated Blood Loss:     Estimated blood loss: none. Procedure:                Pre-Anesthesia Assessment:                           - Prior to the procedure, a History and Physical                            was performed, and patient medications and                            allergies were reviewed. The patient's tolerance of                            previous anesthesia was also reviewed. The risks                            and benefits of the procedure and the sedation                            options and risks were discussed with the patient.                            All questions were answered, and informed consent                            was obtained. Prior Anticoagulants: The patient has                            taken Plavix (clopidogrel), last dose was 2 days                            prior to procedure. ASA Grade Assessment: II - A  patient with mild systemic disease. After reviewing                            the risks and benefits, the patient was deemed in                            satisfactory condition to undergo the procedure.                           After obtaining informed consent, the endoscope was                            passed under direct vision. Throughout the                            procedure, the  patient's blood pressure, pulse, and                            oxygen saturations were monitored continuously. The                            GIF-H190 ([id number]) Olympus gastroscope was                            introduced through the mouth, and advanced to the                            third part of duodenum. The upper GI endoscopy was                            accomplished without difficulty. The patient                            tolerated the procedure well. Scope In: Scope Out: Findings:      A small, Protuberant slightly ulcerated mass with no bleeding and no       stigmata of recent bleeding was found in the lower third of the       esophagus and at the gastroesophageal junction. The mass was       non-obstructing and not circumferential. Biopsies were taken with a cold       forceps for histology.      A medium-sized, fungating and ulcerated, non-circumferential mass with       no bleeding and stigmata of recent bleeding was found in the cardia.       Biopsies were taken with a cold forceps for histology.      The lower gastric body, gastric antrum and prepyloric region of the       stomach were normal.      The duodenal bulb, first portion of the duodenum, second portion of the       duodenum and third portion of the duodenum were normal.      The exam was otherwise without abnormality. 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 Biopsied.                           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                            Biopsied.                           - Normal gastric body, antrum and prepyloric region                            of the stomach.                           - Normal duodenal bulb, first portion of the                            duodenum, second portion of the duodenum and third                             portion of the duodenum.                           - The examination was otherwise normal. Recommendation:           - Soft diet today.                           - Continue present medications.                           - Await pathology results.                           - Return to GI clinic PRN.                           - Telephone GI clinic for pathology results in 3                            days.                           - Telephone GI clinic if symptomatic PRN.                           - Perform a CT scan (computed tomography) of chest                            with contrast, abdomen with contrast and pelvis                            with contrast today.  Procedure Code(s):        --- Professional ---                           [phone], Esophagogastroduodenoscopy, flexible,                            transoral; with biopsy, single or multiple Diagnosis Code(s):        --- Professional ---                           D49.0, Neoplasm of unspecified behavior of                            digestive system                           D50.0, Iron deficiency anemia secondary to blood                            loss (chronic)                           K92.1, Melena (includes Hematochezia) CPT copyright 2019 American Medical Association. All rights reserved. The codes documented in this report are preliminary and upon coder review may  be revised to meet current compliance requirements. Clarene Essex, MD 08/22/2020 12:59:18 PM This report has been signed electronically. Number of Addenda: 0

## 2020-08-22 NOTE — Progress Notes (Signed)
Rose Fillers Salm 12:16 PM  Subjective: Patient seen and examined in hospital computer chart reviewed and case discussed with our PA and he has had melena for about a month no history of anemia and other than some reflux no other GI complaints and his previous colonoscopy was reviewed  Objective: Vital signs stable afebrile no acute distress exam please see preassessment evaluation labs reviewed BUN and creatinine okay liver tests okay guaiac actually negative  Assessment: Anemia questionable etiology in a patient on blood thinner  Plan: Okay to proceed with endoscopy with anesthesia assistance and if nondiagnostic probable colonoscopy tomorrow  [facility]  office [phone] After 5PM or if no answer call [phone]

## 2020-08-22 NOTE — ED Notes (Signed)
Pt transported to ENDO. Pt wife is taking pt personal belongings.

## 2020-08-22 NOTE — Anesthesia Preprocedure Evaluation (Addendum)
Anesthesia Evaluation  Patient identified by MRN, date of birth, ID band Patient awake    Reviewed: Allergy & Precautions, H&P , NPO status , Patient's Chart, lab work & pertinent test results  Airway Mallampati: II  TM Distance: >3 FB Neck ROM: full    Dental  (+) Dental Advisory Given   Pulmonary neg pulmonary ROS,    breath sounds clear to auscultation       Cardiovascular hypertension, + Peripheral Vascular Disease   Rhythm:regular Rate:Normal     Neuro/Psych    GI/Hepatic GI bleeding   Endo/Other    Renal/GU      Musculoskeletal   Abdominal   Peds  Hematology  (+) Blood dyscrasia, anemia ,   Anesthesia Other Findings   Reproductive/Obstetrics                            Anesthesia Physical Anesthesia Plan  ASA: II  Anesthesia Plan: MAC   Post-op Pain Management:    Induction: Intravenous  PONV Risk Score and Plan: 1 and Propofol infusion and Treatment may vary due to age or medical condition  Airway Management Planned: Nasal Cannula  Additional Equipment:   Intra-op Plan:   Post-operative Plan:   Informed Consent: I have reviewed the patients History and Physical, chart, labs and discussed the procedure including the risks, benefits and alternatives for the proposed anesthesia with the patient or authorized representative who has indicated his/her understanding and acceptance.       Plan Discussed with: CRNA, Anesthesiologist and Surgeon  Anesthesia Plan Comments:         Anesthesia Quick Evaluation

## 2020-08-22 NOTE — ED Provider Notes (Signed)
Care assumed from Providence Lanius at 3:00 PM. Please see her note for further details regarding initial workup and plan for this pt. Briefly, 64 y/o male with peripheral vascular disease, HTN and HLD presents for low Hgb found on pre-op labs prior to planned aortogram and venoram. Hgb in-house today found to be 5.8.  Discussed risks and benefits of blood transfusion with the pt and he confirmed that he desires to proceed with transfusion. Expressed understanding of risks and benefits.   Pt remained in stable condition throughout ED course. He will be admitted to the Hospitalist service.   Vanna Scotland, MD 27/25/36 6440    Dorie Rank, MD 08/22/20 1501

## 2020-08-23 ENCOUNTER — Telehealth: Payer: Self-pay | Admitting: Hematology

## 2020-08-23 LAB — IRON AND TIBC
Iron: 17 ug/dL — ABNORMAL LOW (ref 45–182)
Saturation Ratios: 3 % — ABNORMAL LOW (ref 17.9–39.5)
TIBC: 512 ug/dL — ABNORMAL HIGH (ref 250–450)
UIBC: 495 ug/dL

## 2020-08-23 LAB — CBC
HCT: 25.1 % — ABNORMAL LOW (ref 39.0–52.0)
Hemoglobin: 7.6 g/dL — ABNORMAL LOW (ref 13.0–17.0)
MCH: 25.9 pg — ABNORMAL LOW (ref 26.0–34.0)
MCHC: 30.3 g/dL (ref 30.0–36.0)
MCV: 85.7 fL (ref 80.0–100.0)
Platelets: 318 10*3/uL (ref 150–400)
RBC: 2.93 MIL/uL — ABNORMAL LOW (ref 4.22–5.81)
RDW: 16.4 % — ABNORMAL HIGH (ref 11.5–15.5)
WBC: 3.9 10*3/uL — ABNORMAL LOW (ref 4.0–10.5)
nRBC: 0 % (ref 0.0–0.2)

## 2020-08-23 LAB — COMPREHENSIVE METABOLIC PANEL
ALT: 21 U/L (ref 0–44)
AST: 22 U/L (ref 15–41)
Albumin: 3.2 g/dL — ABNORMAL LOW (ref 3.5–5.0)
Alkaline Phosphatase: 69 U/L (ref 38–126)
Anion gap: 10 (ref 5–15)
BUN: 8 mg/dL (ref 8–23)
CO2: 23 mmol/L (ref 22–32)
Calcium: 8.4 mg/dL — ABNORMAL LOW (ref 8.9–10.3)
Chloride: 102 mmol/L (ref 98–111)
Creatinine, Ser: 0.8 mg/dL (ref 0.61–1.24)
GFR, Estimated: >60 mL/min (ref >60)
Glucose, Bld: 101 mg/dL — ABNORMAL HIGH (ref 70–99)
Potassium: 3.7 mmol/L (ref 3.5–5.1)
Sodium: 135 mmol/L (ref 135–145)
Total Bilirubin: 1.1 mg/dL (ref 0.3–1.2)
Total Protein: 5.8 g/dL — ABNORMAL LOW (ref 6.5–8.1)

## 2020-08-23 LAB — FERRITIN: Ferritin: 11 ng/mL — ABNORMAL LOW (ref 24–336)

## 2020-08-23 LAB — VITAMIN B12: Vitamin B-12: 924 pg/mL — ABNORMAL HIGH (ref 180–914)

## 2020-08-23 LAB — MAGNESIUM: Magnesium: 2 mg/dL (ref 1.7–2.4)

## 2020-08-23 LAB — PHOSPHORUS: Phosphorus: 4.1 mg/dL (ref 2.5–4.6)

## 2020-08-23 MED ORDER — AMLODIPINE BESYLATE 5 MG PO TABS: 5.0000 mg | ORAL_TABLET | Freq: Every day | ORAL | 0 refills | Status: DC

## 2020-08-23 MED ORDER — SODIUM CHLORIDE 0.9 % IV SOLN
250.0000 mg | Freq: Once | INTRAVENOUS | Status: AC
  Administered 2020-08-23: 17:00:00 250 mg via INTRAVENOUS
  Filled 2020-08-23 (×2): qty 20

## 2020-08-23 MED ORDER — ENSURE ENLIVE PO LIQD: 237.0000 mL | Freq: Two times a day (BID) | ORAL | 12 refills | Status: DC

## 2020-08-23 MED ORDER — POLYETHYLENE GLYCOL 3350 17 G PO PACK: 17.0000 g | PACK | Freq: Every day | ORAL | 0 refills | Status: DC | PRN

## 2020-08-23 MED ORDER — PANTOPRAZOLE SODIUM 40 MG PO TBEC: 40.0000 mg | DELAYED_RELEASE_TABLET | Freq: Two times a day (BID) | ORAL | 2 refills | Status: DC

## 2020-08-23 NOTE — Anesthesia Postprocedure Evaluation (Signed)
Anesthesia Post Note  Patient: Donald Wade  Procedure(s) Performed: ESOPHAGOGASTRODUODENOSCOPY (EGD) (N/A ) BIOPSY     Patient location during evaluation: Endoscopy Anesthesia Type: MAC Level of consciousness: awake and alert Pain management: pain level controlled Vital Signs Assessment: post-procedure vital signs reviewed and stable Respiratory status: spontaneous breathing, nonlabored ventilation, respiratory function stable and patient connected to nasal cannula oxygen Cardiovascular status: blood pressure returned to baseline and stable Postop Assessment: no apparent nausea or vomiting Anesthetic complications: no   No complications documented.  Last Vitals:  Vitals:   08/22/20 2351 08/23/20 0529  BP: 112/75 120/69  Pulse: 74 74  Resp: 16 16  Temp: 36.9 C 36.7 C  SpO2: 98% 97%    Last Pain:  Vitals:   08/23/20 0529  TempSrc: Oral  PainSc:    Pain Goal:                   [name] S

## 2020-08-23 NOTE — Progress Notes (Signed)
Initial Nutrition Assessment  DOCUMENTATION CODES:   Not applicable  INTERVENTION:  Continue Ensure Enlive po BID, each supplement provides 350 kcal and 20 grams of protein  Encourage adequate PO intake.   NUTRITION DIAGNOSIS:   Increased nutrient needs related to acute illness as evidenced by estimated needs.  GOAL:   Patient will meet greater than or equal to 90% of their needs  MONITOR:   PO intake,Supplement acceptance,Skin,Weight trends,Labs,I & O's  REASON FOR ASSESSMENT:   Malnutrition Screening Tool    ASSESSMENT:   64 y.o. male with medical history significant of PAD, hyperlipidemia and hypertension presents with black tarry stools and exertional dyspnea. Pt with upper GI bleed.  Pt reports appetite has improved since starting protonix. Pt does reports GERD/reflux prior to admission however reports still eating well at home prior to admission. Pt s/p EGD, pt with gastroesophageal junction malignant appearing mass. Pt has been tolerating his soft diet. Pt currently has Ensure ordered and has been consuming them. RD to continue with current orders to aid in caloric and protein needs. Pt encouraged to eat his food at meals and to drink his supplements.   NUTRITION - FOCUSED PHYSICAL EXAM:  Flowsheet Row Most Recent Value  Orbital Region Unable to assess  Upper Arm Region No depletion  Thoracic and Lumbar Region Unable to assess  Buccal Region Unable to assess  Temple Region No depletion  Clavicle Bone Region No depletion  Clavicle and Acromion Bone Region No depletion  Scapular Bone Region Unable to assess  Dorsal Hand Unable to assess  Patellar Region No depletion  Anterior Thigh Region No depletion  Posterior Calf Region No depletion  Edema (RD Assessment) None  Hair Reviewed  Eyes Reviewed  Mouth Reviewed  Skin Reviewed  Nails Reviewed     Labs and medications reviewed.   Diet Order:   Diet Order            Diet - low sodium heart healthy            DIET SOFT Room service appropriate? Yes; Fluid consistency: Thin  Diet effective now                 EDUCATION NEEDS:   Not appropriate for education at this time  Skin:  Skin Assessment: Reviewed RN Assessment  Last BM:  12/14  Height:   Ht Readings from Last 1 Encounters:  08/22/20 6' (1.829 m)    Weight:   Wt Readings from Last 1 Encounters:  08/22/20 93 kg    BMI:  Body mass index is 27.8 kg/m.  Estimated Nutritional Needs:   Kcal:  2200-2400  Protein:  110-120 grams  Fluid:  >/= 2 L/day  Corrin Parker, MS, RD, LDN RD pager number/after hours weekend pager number on Amion.

## 2020-08-23 NOTE — Telephone Encounter (Signed)
Pt has been cld and scheduled to see Dr. Irene Limbo on 12/16 at 230pm. Pt aware to arrive 30 minutes early.

## 2020-08-23 NOTE — Progress Notes (Addendum)
[facility] Gastroenterology Progress Note  Donald Wade 64 y.o. December 03, 1955  CC:  Gastric and esophageal masses  Subjective: Patient denies any abdominal pain.  He is tolerating a soft diet.  Denies dysphagia, nausea, or vomiting.  States he has had brown stools. Denies melena or hematochezia.  He is interested in discharge.  ROS : Review of Systems  Cardiovascular: Negative for chest pain and palpitations.  Gastrointestinal: Positive for heartburn. Negative for abdominal pain, blood in stool, constipation, diarrhea, melena, nausea and vomiting.    Objective: Vital signs in last 24 hours: Vitals:   08/23/20 0529 08/23/20 0902  BP: 120/69 121/70  Pulse: 74 79  Resp: 16   Temp: 98 F (36.7 C)   SpO2: 97%     Physical Exam:  General:  Alert, oriented, cooperative, no distress  Head:  Normocephalic, without obvious abnormality, atraumatic  Eyes:  Mild conjunctival pallor, EOMs intact  Lungs:   Clear to auscultation bilaterally, respirations unlabored  Heart:  Regular rate and rhythm, S1, S2 normal  Abdomen:   Soft, non-tender, non-distended, bowel sounds active all four quadrants,  no guarding or peritoneal signs  Extremities: Extremities normal, atraumatic, no  edema  Pulses: 2+ and symmetric    Lab Results: Recent Labs    08/22/20 0139 08/23/20 0403  NA 133* 135  K 3.6 3.7  CL 102 102  CO2 23 23  GLUCOSE 114* 101*  BUN 9 8  CREATININE 0.77 0.80  CALCIUM 8.5* 8.4*  MG  --  2.0  PHOS  --  4.1   Recent Labs    08/22/20 0139 08/23/20 0403  AST 23 22  ALT 20 21  ALKPHOS 72 69  BILITOT 1.0 1.1  PROT 5.9* 5.8*  ALBUMIN 3.2* 3.2*   Recent Labs    08/22/20 0139 08/23/20 0830  WBC 3.7* 3.9*  NEUTROABS 2.3  --   HGB 7.4* 7.6*  HCT 24.0* 25.1*  MCV 85.7 85.7  PLT 298 318   Recent Labs    08/22/20 0139 08/22/20 0334  LABPROT 13.2 13.2  INR 1.0 1.0      Assessment: 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  Plan: Continue Protonix 40 mg PO BID.  Hold Plavix for 1 week, if OK from cardiovascular standpoint.  Outpatient urgent oncology consultation once pathology is resulted.  Oral iron supplementation if low iron confirmed via ferritin/iron panel.  Check Hgb in 1-2 weeks (can be completed with PCP, [facility], or oncology)  OK to discharge from a GI standpoint.  [facility] will sign off.  Please contact us if we can be of any further assistance during this hospital stay.  Salley Slaughter PA-C 08/23/2020, 11:01 AM  Contact #  [phone]

## 2020-08-23 NOTE — Progress Notes (Signed)
Nsg Discharge Note  Admit Date:  08/21/2020 Discharge date: 08/23/2020   Rose Fillers Pursley to be D/C'd Home per MD order.  AVS completed.  Copy for chart, and copy for patient signed, and dated. Patient/caregiver able to verbalize understanding.  Discharge Medication: Allergies as of 08/23/2020      Reactions   Bee Venom Anaphylaxis   Penicillin G Anaphylaxis, Hives, Swelling   Did it involve swelling of the face/tongue/throat, SOB, or low BP? Yes Did it involve sudden or severe rash/hives, skin peeling, or any reaction on the inside of your mouth or nose? Yes Did you need to seek medical attention at a hospital or doctor's office? Yes When did it last happen?1990 If all above answers are "NO", may proceed with cephalosporin use.      Medication List    STOP taking these medications   cilostazol 50 MG tablet Commonly known as: PLETAL   clopidogrel 75 MG tablet Commonly known as: PLAVIX   Olmesartan-amLODIPine-HCTZ 40-5-12.5 MG Tabs Replaced by: amLODipine 5 MG tablet     TAKE these medications   acetaminophen 325 MG tablet Commonly known as: TYLENOL Take 650 mg by mouth every 6 (six) hours as needed for mild pain.   amLODipine 5 MG tablet Commonly known as: NORVASC Take 1 tablet (5 mg total) by mouth daily. Start taking on: August 24, 2020 Replaces: Olmesartan-amLODIPine-HCTZ 40-5-12.5 MG Tabs   atorvastatin 40 MG tablet Commonly known as: LIPITOR Take 1 tablet (40 mg total) by mouth daily.   buPROPion 150 MG 24 hr tablet Commonly known as: WELLBUTRIN XL Take 150 mg by mouth daily.   EPINEPHrine 0.3 mg/0.3 mL Soaj injection Commonly known as: EPI-PEN Inject 0.3 mg into the skin as needed for anaphylaxis (call 911).   feeding supplement Liqd Take 237 mLs by mouth 2 (two) times daily between meals.   fexofenadine 180 MG tablet Commonly known as: ALLEGRA Take 180 mg by mouth daily as needed for allergies.   metoprolol succinate 50 MG 24 hr  tablet Commonly known as: Toprol XL Take 1 tablet (50 mg total) by mouth daily. What changed: how much to take   Multi-Vitamin tablet Take 1 tablet by mouth daily.   Omega-3 1000 MG Caps Take 1,000 mg by mouth once a week.   pantoprazole 40 MG tablet Commonly known as: PROTONIX Take 1 tablet (40 mg total) by mouth 2 (two) times daily.   polyethylene glycol 17 g packet Commonly known as: MIRALAX / GLYCOLAX Take 17 g by mouth daily as needed for mild constipation.   vitamin B-12 1000 MCG tablet Commonly known as: CYANOCOBALAMIN Take 1,000 mcg by mouth daily.       Discharge Assessment: Vitals:   08/23/20 0902 08/23/20 1223  BP: 121/70 (!) 129/48  Pulse: 79   Resp:  16  Temp:  98.3 F (36.8 C)  SpO2:  97%   Skin clean, dry and intact without evidence of skin break down, no evidence of skin tears noted. IV catheter discontinued intact. Site without signs and symptoms of complications - no redness or edema noted at insertion site, patient denies c/o pain - only slight tenderness at site.  Dressing with slight pressure applied.  D/c Instructions-Education: Discharge instructions given to patient/family with verbalized understanding. D/c education completed with patient/family including follow up instructions, medication list, d/c activities limitations if indicated, with other d/c instructions as indicated by MD - patient able to verbalize understanding, all questions fully answered. Patient instructed to return to ED, call  911, or call MD for any changes in condition.  Patient escorted via [place], and D/C home via private auto.  Erasmo Leventhal, RN 08/23/2020 4:15 PM

## 2020-08-23 NOTE — Discharge Summary (Signed)
Physician Discharge Summary  ZEBBIE ACE WNU:[id number] DOB: 10/30/1955 DOA: 08/21/2020  PCP: Berkley Harvey, NP  Admit date: 08/21/2020 Discharge date: 08/23/2020  Admitted From: Home  Disposition:  Home  Recommendations for Outpatient Follow-up:  1. Follow up with PCP in 1-2 weeks, follow BP and CBC 2. Please obtain BMP/CBC in one week 3. Follow up with primary Cardiology Dr Virgina Jock to follow up after stopping Plavix/cilostazole for PVD, to further discussed safety of resuming plavix/ cilostazole. 4. Follow up with Dr Irene Limbo, oncologist for further treatment options of malignancy.  5. Patient will probably benefit from more IV iron.    Discharge Condition: Stable. CODE STATUS: Full code Diet recommendation: Heart Healthy   Brief/Interim Summary: DADE RODIN is a 64 y.o. male with medical history significant of PAD, hyperlipidemia and hypertension came to ED at the advice of his vascular surgeon. Apparently patient went for angiography/angioplasty of his lower extremities by vascular surgery and found to have hemoglobin of 6, procedure was canceled and he was advised to go to ED for further investigation and management.  Patient has an history of black tarry stool for the past 11-month, thought to be due to his medications.  Also complaining of worsening GERD and dyspepsia for the same duration.  Patient was started on Plavix and cilostazol by vascular surgery approximately 6 months ago, per patient he developed dyspepsia and GERD since then.  Patient was found to have Hb at 5.9 he received 2 units of PRBC on admission. He also received a dose of IV ferric gluconate prior to discharge.  He underwent endoscopy which showed small protuberant mass in the lower third of esophagus at the GE junction. A medium size fungating and ulcerated, non circumferential mass in the cardia. Follow up with oncologist has been arranged.   Symptomatic acute blood loss anemia. Secondary  to GE , cardia mass bleeding.  Received IV iron.  Received 2 units PRBC.  Hb stable today at 7.6 Continue to hold plavix and cilostazole for 1 week, discussed with cardiology.  GE junction poorly differentiated  adenocarcinoma, with extracellular mucin and signet ring cells.  Discussed with Dr Irene Limbo, he will arrange follow up for patient. CT negative for metastasis on the chest, Lymphadenopathy abdomen.   History of PAD; discussed with cardiology, ok t hold plavix, cilostazole.    HTN;  Continue with metoprolol.  Will discontinue three combination BP medications to avoid Hypotension.  I have provided prescription for Norvasc.      Discharge Diagnoses:  Principal Problem:   Acute blood loss anemia Active Problems:   Exertional dyspnea   Essential hypertension   PAD (peripheral artery disease) (HCC)   Mixed hyperlipidemia   Symptomatic anemia   GI bleed   GE poorly differentiated adenocarcinoma   Discharge Instructions  Discharge Instructions    Diet - low sodium heart healthy   Complete by: As directed    Increase activity slowly   Complete by: As directed      Allergies as of 08/23/2020      Reactions   Bee Venom Anaphylaxis   Penicillin G Anaphylaxis, Hives, Swelling   Did it involve swelling of the face/tongue/throat, SOB, or low BP? Yes Did it involve sudden or severe rash/hives, skin peeling, or any reaction on the inside of your mouth or nose? Yes Did you need to seek medical attention at a hospital or doctor's office? Yes When did it last happen?1990 If all above answers are "NO", may proceed with cephalosporin use.  Medication List    STOP taking these medications   cilostazol 50 MG tablet Commonly known as: PLETAL   clopidogrel 75 MG tablet Commonly known as: PLAVIX   Olmesartan-amLODIPine-HCTZ 40-5-12.5 MG Tabs Replaced by: amLODipine 5 MG tablet     TAKE these medications   acetaminophen 325 MG tablet Commonly known as:  TYLENOL Take 650 mg by mouth every 6 (six) hours as needed for mild pain.   amLODipine 5 MG tablet Commonly known as: NORVASC Take 1 tablet (5 mg total) by mouth daily. Start taking on: August 24, 2020 Replaces: Olmesartan-amLODIPine-HCTZ 40-5-12.5 MG Tabs   atorvastatin 40 MG tablet Commonly known as: LIPITOR Take 1 tablet (40 mg total) by mouth daily.   buPROPion 150 MG 24 hr tablet Commonly known as: WELLBUTRIN XL Take 150 mg by mouth daily.   EPINEPHrine 0.3 mg/0.3 mL Soaj injection Commonly known as: EPI-PEN Inject 0.3 mg into the skin as needed for anaphylaxis (call 911).   feeding supplement Liqd Take 237 mLs by mouth 2 (two) times daily between meals.   fexofenadine 180 MG tablet Commonly known as: ALLEGRA Take 180 mg by mouth daily as needed for allergies.   metoprolol succinate 50 MG 24 hr tablet Commonly known as: Toprol XL Take 1 tablet (50 mg total) by mouth daily. What changed: how much to take   Multi-Vitamin tablet Take 1 tablet by mouth daily.   Omega-3 1000 MG Caps Take 1,000 mg by mouth once a week.   pantoprazole 40 MG tablet Commonly known as: PROTONIX Take 1 tablet (40 mg total) by mouth 2 (two) times daily.   polyethylene glycol 17 g packet Commonly known as: MIRALAX / GLYCOLAX Take 17 g by mouth daily as needed for mild constipation.   vitamin B-12 1000 MCG tablet Commonly known as: CYANOCOBALAMIN Take 1,000 mcg by mouth daily.       Follow-up Information    Brunetta Genera, MD Follow up in 1 week(s).   Specialties: Hematology, Oncology Contact information: [place] [address] [phone]        Nigel Mormon, MD Follow up in 1 week(s).   Specialties: Cardiology, Radiology Why: please call office to arrange follow up in 1-2 weeks.  Contact information: [place] [address] [phone]              Allergies  Allergen Reactions  . Bee Venom  Anaphylaxis  . Penicillin G Anaphylaxis, Hives and Swelling    Did it involve swelling of the face/tongue/throat, SOB, or low BP? Yes Did it involve sudden or severe rash/hives, skin peeling, or any reaction on the inside of your mouth or nose? Yes Did you need to seek medical attention at a hospital or doctor's office? Yes When did it last happen?1990 If all above answers are "NO", may proceed with cephalosporin use.    Consultations:  GI   Procedures/Studies: CT CHEST W CONTRAST  Result Date: 08/22/2020 CLINICAL DATA:  64 year old male with GI cancer staging. EXAM: CT CHEST, ABDOMEN, AND PELVIS WITH CONTRAST TECHNIQUE: Multidetector CT imaging of the chest, abdomen and pelvis was performed following the standard protocol during bolus administration of intravenous contrast. CONTRAST:  153mL OMNIPAQUE IOHEXOL 300 MG/ML  SOLN COMPARISON:  CT abdomen pelvis dated 01/03/2004. FINDINGS: CT CHEST FINDINGS Cardiovascular: There is no cardiomegaly or pericardial effusion. There is coronary vascular calcification. Mild atherosclerotic calcification of the thoracic aorta. No aneurysmal dilatation or dissection. The origins of the great  vessels of the aortic arch appear patent as visualized. The central pulmonary arteries appear patent. Mediastinum/Nodes: No hilar or mediastinal adenopathy. Top-normal right hilar lymph node measures 9 mm. The esophagus and the thyroid gland are grossly unremarkable. No mediastinal fluid collection. Lungs/Pleura: Minimal left lung base dependent atelectasis. No focal consolidation, pleural effusion, pneumothorax. The central airways are patent. Musculoskeletal: Old healed left posterior rib fractures. No acute osseous pathology. CT ABDOMEN PELVIS FINDINGS No intra-abdominal free air or free fluid. Hepatobiliary: No focal liver abnormality is seen. No gallstones, gallbladder wall thickening, or biliary dilatation. Pancreas: Unremarkable. No pancreatic ductal dilatation  or surrounding inflammatory changes. Spleen: Normal in size without focal abnormality. Adrenals/Urinary Tract: The adrenal glands unremarkable. There is no hydronephrosis on either side. There is symmetric enhancement and excretion of contrast by both kidneys. The visualized ureters and urinary bladder appear unremarkable. Stomach/Bowel: There is sigmoid diverticulosis and scattered colonic diverticula without active inflammatory changes. There is thickened appearance of the wall of the stomach adjacent to the gastroesophageal junction as well as thickening of the proximal gastric wall. No evidence of gastric outlet obstruction. There is no bowel obstruction or active inflammation. Appendectomy. Vascular/Lymphatic: Advanced aortoiliac atherosclerotic disease. The IVC is unremarkable. No portal venous gas. Several small but mildly rounded gastrohepatic lymph nodes concerning for early nodal metastatic disease. Several additional mildly enlarged retroperitoneal lymph nodes along the abdominal aorta and IVC noted. A mildly enlarged lymph node anterior to the aorta measures 10 mm in short axis (91/3). Reproductive: The prostate and seminal vesicles are grossly unremarkable. Other: None Musculoskeletal: Degenerative changes of the spine. No acute osseous pathology. IMPRESSION: 1. Thickened appearance of the wall of the stomach adjacent to the gastroesophageal junction as well as proximal gastric wall concerning for infiltrative neoplasm. 2. Rounded gastrohepatic lymph nodes as well as mildly enlarged retroperitoneal lymph nodes concerning for early nodal metastatic disease. 3. No evidence of metastatic disease in the chest. 4. Colonic diverticulosis. 5. Aortic Atherosclerosis (ICD10-I70.0). Electronically Signed   By: Anner Crete M.D.   On: 08/22/2020 20:03   CT ABDOMEN PELVIS W CONTRAST  Result Date: 08/22/2020 CLINICAL DATA:  64 year old male with GI cancer staging. EXAM: CT CHEST, ABDOMEN, AND PELVIS WITH  CONTRAST TECHNIQUE: Multidetector CT imaging of the chest, abdomen and pelvis was performed following the standard protocol during bolus administration of intravenous contrast. CONTRAST:  161mL OMNIPAQUE IOHEXOL 300 MG/ML  SOLN COMPARISON:  CT abdomen pelvis dated 01/03/2004. FINDINGS: CT CHEST FINDINGS Cardiovascular: There is no cardiomegaly or pericardial effusion. There is coronary vascular calcification. Mild atherosclerotic calcification of the thoracic aorta. No aneurysmal dilatation or dissection. The origins of the great vessels of the aortic arch appear patent as visualized. The central pulmonary arteries appear patent. Mediastinum/Nodes: No hilar or mediastinal adenopathy. Top-normal right hilar lymph node measures 9 mm. The esophagus and the thyroid gland are grossly unremarkable. No mediastinal fluid collection. Lungs/Pleura: Minimal left lung base dependent atelectasis. No focal consolidation, pleural effusion, pneumothorax. The central airways are patent. Musculoskeletal: Old healed left posterior rib fractures. No acute osseous pathology. CT ABDOMEN PELVIS FINDINGS No intra-abdominal free air or free fluid. Hepatobiliary: No focal liver abnormality is seen. No gallstones, gallbladder wall thickening, or biliary dilatation. Pancreas: Unremarkable. No pancreatic ductal dilatation or surrounding inflammatory changes. Spleen: Normal in size without focal abnormality. Adrenals/Urinary Tract: The adrenal glands unremarkable. There is no hydronephrosis on either side. There is symmetric enhancement and excretion of contrast by both kidneys. The visualized ureters and urinary bladder appear unremarkable. Stomach/Bowel: There  is sigmoid diverticulosis and scattered colonic diverticula without active inflammatory changes. There is thickened appearance of the wall of the stomach adjacent to the gastroesophageal junction as well as thickening of the proximal gastric wall. No evidence of gastric outlet  obstruction. There is no bowel obstruction or active inflammation. Appendectomy. Vascular/Lymphatic: Advanced aortoiliac atherosclerotic disease. The IVC is unremarkable. No portal venous gas. Several small but mildly rounded gastrohepatic lymph nodes concerning for early nodal metastatic disease. Several additional mildly enlarged retroperitoneal lymph nodes along the abdominal aorta and IVC noted. A mildly enlarged lymph node anterior to the aorta measures 10 mm in short axis (91/3). Reproductive: The prostate and seminal vesicles are grossly unremarkable. Other: None Musculoskeletal: Degenerative changes of the spine. No acute osseous pathology. IMPRESSION: 1. Thickened appearance of the wall of the stomach adjacent to the gastroesophageal junction as well as proximal gastric wall concerning for infiltrative neoplasm. 2. Rounded gastrohepatic lymph nodes as well as mildly enlarged retroperitoneal lymph nodes concerning for early nodal metastatic disease. 3. No evidence of metastatic disease in the chest. 4. Colonic diverticulosis. 5. Aortic Atherosclerosis (ICD10-I70.0). Electronically Signed   By: Anner Crete M.D.   On: 08/22/2020 20:03   (Echo, Carotid, EGD, Colonoscopy, ERCP)    Subjective: He is feeling better, dizziness has improved. He feels with more energy.   Discharge Exam: Vitals:   08/23/20 1223 08/23/20 1223  BP: (!) 129/48 (!) 129/48  Pulse:  76  Resp: 16 16  Temp: 98.3 F (36.8 C) 98.3 F (36.8 C)  SpO2: 97% 97%     General: Pt is alert, awake, not in acute distress Cardiovascular: RRR, S1/S2 +, no rubs, no gallops Respiratory: CTA bilaterally, no wheezing, no rhonchi Abdominal: Soft, NT, ND, bowel sounds + Extremities: no edema, no cyanosis    The results of significant diagnostics from this hospitalization (including imaging, microbiology, ancillary and laboratory) are listed below for reference.     Microbiology: Recent Results (from the past 240 hour(s))   Resp Panel by RT-PCR (Flu A&B, Covid) Nasopharyngeal Swab     Status: None   Collection Time: 08/21/20  4:10 PM   Specimen: Nasopharyngeal Swab; Nasopharyngeal(NP) swabs in vial transport medium  Result Value Ref Range Status   SARS Coronavirus 2 by RT PCR NEGATIVE NEGATIVE Final    Comment: (NOTE) SARS-CoV-2 target nucleic acids are NOT DETECTED.  The SARS-CoV-2 RNA is generally detectable in upper respiratory specimens during the acute phase of infection. The lowest concentration of SARS-CoV-2 viral copies this assay can detect is 138 copies/mL. A negative result does not preclude SARS-Cov-2 infection and should not be used as the sole basis for treatment or other patient management decisions. A negative result may occur with  improper specimen collection/handling, submission of specimen other than nasopharyngeal swab, presence of viral mutation(s) within the areas targeted by this assay, and inadequate number of viral copies(<138 copies/mL). A negative result must be combined with clinical observations, patient history, and epidemiological information. The expected result is Negative.  Fact Sheet for Patients:  EntrepreneurPulse.com.au  Fact Sheet for Healthcare Providers:  IncredibleEmployment.be  This test is no t yet approved or cleared by the [place] FDA and  has been authorized for detection and/or diagnosis of SARS-CoV-2 by FDA under an Emergency Use Authorization (EUA). This EUA will remain  in effect (meaning this test can be used) for the duration of the COVID-19 declaration under Section 564(b)(1) of the Act, 21 U.S.C.section 360bbb-3(b)(1), unless the authorization is terminated  or revoked sooner.  Influenza A by PCR NEGATIVE NEGATIVE Final   Influenza B by PCR NEGATIVE NEGATIVE Final    Comment: (NOTE) The Xpert Xpress SARS-CoV-2/FLU/RSV plus assay is intended as an aid in the diagnosis of influenza from  Nasopharyngeal swab specimens and should not be used as a sole basis for treatment. Nasal washings and aspirates are unacceptable for Xpert Xpress SARS-CoV-2/FLU/RSV testing.  Fact Sheet for Patients: EntrepreneurPulse.com.au  Fact Sheet for Healthcare Providers: IncredibleEmployment.be  This test is not yet approved or cleared by the [place] FDA and has been authorized for detection and/or diagnosis of SARS-CoV-2 by FDA under an Emergency Use Authorization (EUA). This EUA will remain in effect (meaning this test can be used) for the duration of the COVID-19 declaration under Section 564(b)(1) of the Act, 21 U.S.C. section 360bbb-3(b)(1), unless the authorization is terminated or revoked.  Performed at [facility], [place] [address] [place], [place] [address]      Labs: BNP (last 3 results) No results for input(s): BNP in the last 8760 hours. Basic Metabolic Panel: Recent Labs  Lab 08/21/20 1402 08/22/20 0139 08/23/20 0403  NA 135 133* 135  K 4.0 3.6 3.7  CL 101 102 102  CO2 25 23 23   GLUCOSE 124* 114* 101*  BUN 11 9 8   CREATININE 0.92 0.77 0.80  CALCIUM 8.9 8.5* 8.4*  MG  --   --  2.0  PHOS  --   --  4.1   Liver Function Tests: Recent Labs  Lab 08/21/20 1402 08/22/20 0139 08/23/20 0403  AST 24 23 22   ALT 21 20 21   ALKPHOS 79 72 69  BILITOT 0.6 1.0 1.1  PROT 6.4* 5.9* 5.8*  ALBUMIN 3.4* 3.2* 3.2*   No results for input(s): LIPASE, AMYLASE in the last 168 hours. No results for input(s): AMMONIA in the last 168 hours. CBC: Recent Labs  Lab 08/21/20 1402 08/22/20 0139 08/23/20 0830  WBC 3.6* 3.7* 3.9*  NEUTROABS  --  2.3  --   HGB 5.8* 7.4* 7.6*  HCT 19.5* 24.0* 25.1*  MCV 85.9 85.7 85.7  PLT 376 298 318   Cardiac Enzymes: No results for input(s): CKTOTAL, CKMB, CKMBINDEX, TROPONINI in the last 168 hours. BNP: Invalid input(s): POCBNP CBG: No results for input(s): GLUCAP in the last 168  hours. D-Dimer No results for input(s): DDIMER in the last 72 hours. Hgb A1c No results for input(s): HGBA1C in the last 72 hours. Lipid Profile No results for input(s): CHOL, HDL, LDLCALC, TRIG, CHOLHDL, LDLDIRECT in the last 72 hours. Thyroid function studies No results for input(s): TSH, T4TOTAL, T3FREE, THYROIDAB in the last 72 hours.  Invalid input(s): FREET3 Anemia work up Recent Labs    08/22/20 0334 08/23/20 1302  VITAMINB12  --  924*  FERRITIN  --  11*  TIBC  --  512*  IRON  --  17*  RETICCTPCT 2.6  --    Urinalysis No results found for: COLORURINE, APPEARANCEUR, LABSPEC, [place], GLUCOSEU, HGBUR, BILIRUBINUR, KETONESUR, PROTEINUR, UROBILINOGEN, NITRITE, LEUKOCYTESUR Sepsis Labs Invalid input(s): PROCALCITONIN,  WBC,  LACTICIDVEN Microbiology Recent Results (from the past 240 hour(s))  Resp Panel by RT-PCR (Flu A&B, Covid) Nasopharyngeal Swab     Status: None   Collection Time: 08/21/20  4:10 PM   Specimen: Nasopharyngeal Swab; Nasopharyngeal(NP) swabs in vial transport medium  Result Value Ref Range Status   SARS Coronavirus 2 by RT PCR NEGATIVE NEGATIVE Final    Comment: (NOTE) SARS-CoV-2 target nucleic acids are NOT DETECTED.  The  SARS-CoV-2 RNA is generally detectable in upper respiratory specimens during the acute phase of infection. The lowest concentration of SARS-CoV-2 viral copies this assay can detect is 138 copies/mL. A negative result does not preclude SARS-Cov-2 infection and should not be used as the sole basis for treatment or other patient management decisions. A negative result may occur with  improper specimen collection/handling, submission of specimen other than nasopharyngeal swab, presence of viral mutation(s) within the areas targeted by this assay, and inadequate number of viral copies(<138 copies/mL). A negative result must be combined with clinical observations, patient history, and epidemiological information. The expected result is  Negative.  Fact Sheet for Patients:  EntrepreneurPulse.com.au  Fact Sheet for Healthcare Providers:  IncredibleEmployment.be  This test is no t yet approved or cleared by the [place] FDA and  has been authorized for detection and/or diagnosis of SARS-CoV-2 by FDA under an Emergency Use Authorization (EUA). This EUA will remain  in effect (meaning this test can be used) for the duration of the COVID-19 declaration under Section 564(b)(1) of the Act, 21 U.S.C.section 360bbb-3(b)(1), unless the authorization is terminated  or revoked sooner.       Influenza A by PCR NEGATIVE NEGATIVE Final   Influenza B by PCR NEGATIVE NEGATIVE Final    Comment: (NOTE) The Xpert Xpress SARS-CoV-2/FLU/RSV plus assay is intended as an aid in the diagnosis of influenza from Nasopharyngeal swab specimens and should not be used as a sole basis for treatment. Nasal washings and aspirates are unacceptable for Xpert Xpress SARS-CoV-2/FLU/RSV testing.  Fact Sheet for Patients: EntrepreneurPulse.com.au  Fact Sheet for Healthcare Providers: IncredibleEmployment.be  This test is not yet approved or cleared by the [place] FDA and has been authorized for detection and/or diagnosis of SARS-CoV-2 by FDA under an Emergency Use Authorization (EUA). This EUA will remain in effect (meaning this test can be used) for the duration of the COVID-19 declaration under Section 564(b)(1) of the Act, 21 U.S.C. section 360bbb-3(b)(1), unless the authorization is terminated or revoked.  Performed at [facility], [place] [address] [place], [place] [address]      Time coordinating discharge: 40 minutes  SIGNED:   Elmarie Shiley, MD  [facility]

## 2020-08-24 ENCOUNTER — Other Ambulatory Visit: Payer: Self-pay

## 2020-08-24 ENCOUNTER — Inpatient Hospital Stay: Payer: No Typology Code available for payment source | Attending: Hematology | Admitting: Hematology

## 2020-08-24 VITALS — BP 136/63 | HR 73 | Temp 98.4°F | Resp 18 | Ht 72.0 in | Wt 202.7 lb

## 2020-08-24 DIAGNOSIS — I10 Essential (primary) hypertension: Secondary | ICD-10-CM | POA: Diagnosis not present

## 2020-08-24 DIAGNOSIS — D649 Anemia, unspecified: Secondary | ICD-10-CM

## 2020-08-24 DIAGNOSIS — I739 Peripheral vascular disease, unspecified: Secondary | ICD-10-CM | POA: Diagnosis not present

## 2020-08-24 DIAGNOSIS — R011 Cardiac murmur, unspecified: Secondary | ICD-10-CM | POA: Insufficient documentation

## 2020-08-24 DIAGNOSIS — R590 Localized enlarged lymph nodes: Secondary | ICD-10-CM | POA: Diagnosis not present

## 2020-08-24 DIAGNOSIS — C160 Malignant neoplasm of cardia: Secondary | ICD-10-CM | POA: Insufficient documentation

## 2020-08-24 DIAGNOSIS — Z79899 Other long term (current) drug therapy: Secondary | ICD-10-CM | POA: Diagnosis not present

## 2020-08-24 DIAGNOSIS — E538 Deficiency of other specified B group vitamins: Secondary | ICD-10-CM | POA: Diagnosis not present

## 2020-08-24 DIAGNOSIS — K921 Melena: Secondary | ICD-10-CM | POA: Insufficient documentation

## 2020-08-24 DIAGNOSIS — I700 Atherosclerosis of aorta: Secondary | ICD-10-CM | POA: Insufficient documentation

## 2020-08-24 DIAGNOSIS — D500 Iron deficiency anemia secondary to blood loss (chronic): Secondary | ICD-10-CM | POA: Insufficient documentation

## 2020-08-24 DIAGNOSIS — E785 Hyperlipidemia, unspecified: Secondary | ICD-10-CM | POA: Diagnosis not present

## 2020-08-24 DIAGNOSIS — C158 Malignant neoplasm of overlapping sites of esophagus: Secondary | ICD-10-CM

## 2020-08-24 DIAGNOSIS — K219 Gastro-esophageal reflux disease without esophagitis: Secondary | ICD-10-CM | POA: Diagnosis not present

## 2020-08-24 DIAGNOSIS — K922 Gastrointestinal hemorrhage, unspecified: Secondary | ICD-10-CM

## 2020-08-24 NOTE — Progress Notes (Addendum)
HEMATOLOGY/ONCOLOGY CONSULTATION NOTE  Date of Service: 08/24/2020  Patient Care Team: Berkley Harvey, NP as PCP - General (Nurse Practitioner)  CHIEF COMPLAINTS/PURPOSE OF CONSULTATION:  GEJ Adenocarcinoma   HISTORY OF PRESENTING ILLNESS:  Donald Wade is a wonderful 64 y.o. 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 Pt is accompanied today by his wife, [name]. The pt reports that he is doing well overall.  The pt reports that he feels better today than he has in a month. Pt was seen by Vascular Surgery for his Peripheral Artery Disease and was placed on Plavix and Cilostazol in September. Pt began having black, tarry stools about 1.5 months ago that he thought was medication-related. He became very lightheaded and dizzy prior to his his hospital admission on 08/21/2020. Pt was given 2 units of PRBC and IV Iron in the hospital. Pt was placed on Protonix twice per day and was taken off of Plavix and Cilostazol prior to discharge.   Pt denies any difficulty swallowing and is taking measures to eat small bites. He has had noticeable acid reflux in the last three months that has worsened recently.   He has Hypertension and Hyperlipidemia. He denies any history of COPD, Emphysema, Allergies, Diabetes, or ongoing heart issues. Pt is up to date with his Colonoscopies and has never had any Prostate issues.    Pt has had Upper Endoscopy completed on 08/22/2020 with results revealing "- Likely malignant esophageal tumor was found in the lower third of the esophagus and at the gastroesophageal junction. Biopsied. 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 - Normal gastric body, antrum and prepyloric region of the stomach. - Normal duodenal bulb, first portion of the duodenum, second portion of the duodenum and third portion of the duodenum. - The examination was otherwise normal."  Of note prior to the patient's visit  today, pt has had Esophageal/Stomach Mass Surgical Pathology [phone]) completed on 08/22/2020 with results revealing "A. ESOPHAGUS, DISTAL MASS, BIOPSY:  - Poorly differentiated adenocarcinoma with extracellular mucin and  signet ring cells. B. STOMACH, MASS, BIOPSY: - Poorly differentiated adenocarcinoma with extracellular mucin and  signet ring cells."   Pt has had CT C/A/P ([id number]) ([id number]) completed on 08/22/2020 with results revealing "1. Thickened appearance of the wall of the stomach adjacent to the gastroesophageal junction as well as proximal gastric wall concerning for infiltrative neoplasm. 2. Rounded gastrohepatic lymph nodes as well as mildly enlarged retroperitoneal lymph nodes concerning for early nodal metastatic disease. 3. No evidence of metastatic disease in the chest. 4. Colonic diverticulosis. 5. Aortic Atherosclerosis."  Most recent lab results (08/23/2020) of CBC is as follows: all values are WNL except for WBC at 3.9K, RBC at 2.93, Hgb at 7.6, HCT at 25.1, MCH at 25.9, RDW at 16.4, Glucose at 101, Calcium at 8.4, Total Protein at 5.8, Albumin at 3.2.  On review of systems, pt denies dysphagia, SOB, abdominal pain, dysuria, back pain, leg swelling and any other symptoms.   On PMHx the pt reports PAD, HTN, HLD, Appendectomy, Left Heart Cath. On Social Hx the pt reports that he is a non-smoker. He drinks 3-4 beers per day on average. Pt denies any other recreational drug use.  On Family Hx the pt reports that his mother had Colon Cancer in her mid-60's and his father had Throat Cancer after an extensive history of cigar smoking.    MEDICAL HISTORY:  Past Medical History:  Diagnosis Date  .  Anemia 08/22/2020  . Heart murmur   . Hyperlipidemia   . Hypertension     SURGICAL HISTORY: Past Surgical History:  Procedure Laterality Date  . APPENDECTOMY  1980  . BIOPSY  08/22/2020   Procedure: BIOPSY;  Surgeon: Clarene Essex, MD;  Location: [facility] ENDOSCOPY;  Service:  Endoscopy;;  . ESOPHAGOGASTRODUODENOSCOPY  08/22/2020  . ESOPHAGOGASTRODUODENOSCOPY N/A 08/22/2020   Procedure: ESOPHAGOGASTRODUODENOSCOPY (EGD);  Surgeon: Clarene Essex, MD;  Location: [place];  Service: Endoscopy;  Laterality: N/A;  . LEFT HEART CATH AND CORONARY ANGIOGRAPHY N/A 11/23/2019   Procedure: LEFT HEART CATH AND CORONARY ANGIOGRAPHY;  Surgeon: Nigel Mormon, MD;  Location: [place] [facility];  Service: Cardiovascular;  Laterality: N/A;    SOCIAL HISTORY: Social History   Socioeconomic History  . Marital status: Married    Spouse name: Not on file  . Number of children: 2  . Years of education: Not on file  . Highest education level: Not on file  Occupational History  . Not on file  Tobacco Use  . Smoking status: Never Smoker  . Smokeless tobacco: Never Used  Vaping Use  . Vaping Use: Never used  Substance and Sexual Activity  . Alcohol use: Yes    Comment: 2 beers daily  . Drug use: Never  . Sexual activity: Not on file  Other Topics Concern  . Not on file  Social History Narrative  . Not on file   Social Determinants of Health   Financial Resource Strain: Not on file  Food Insecurity: Not on file  Transportation Needs: Not on file  Physical Activity: Not on file  Stress: Not on file  Social Connections: Not on file  Intimate Partner Violence: Not on file    FAMILY HISTORY: Family History  Problem Relation Age of Onset  . Heart disease Mother   . Atrial fibrillation Mother     ALLERGIES:  is allergic to bee venom and penicillin g.  MEDICATIONS:  Current Outpatient Medications  Medication Sig Dispense Refill  . acetaminophen (TYLENOL) 325 MG tablet Take 650 mg by mouth every 6 (six) hours as needed for mild pain.    Marland Kitchen amLODipine (NORVASC) 5 MG tablet Take 1 tablet (5 mg total) by mouth daily. 30 tablet 0  . atorvastatin (LIPITOR) 40 MG tablet Take 1 tablet (40 mg total) by mouth daily. 90 tablet 3  . buPROPion (WELLBUTRIN XL) 150 MG 24  hr tablet Take 150 mg by mouth daily.     Marland Kitchen EPINEPHrine 0.3 mg/0.3 mL IJ SOAJ injection Inject 0.3 mg into the skin as needed for anaphylaxis (call 911).    . feeding supplement (ENSURE ENLIVE / ENSURE PLUS) LIQD Take 237 mLs by mouth 2 (two) times daily between meals. 237 mL 12  . fexofenadine (ALLEGRA) 180 MG tablet Take 180 mg by mouth daily as needed for allergies.    . metoprolol succinate (TOPROL XL) 50 MG 24 hr tablet Take 1 tablet (50 mg total) by mouth daily. (Patient taking differently: Take 25 mg by mouth daily.) 90 tablet 2  . Multiple Vitamin (MULTI-VITAMIN) tablet Take 1 tablet by mouth daily.    . Omega-3 1000 MG CAPS Take 1,000 mg by mouth once a week.    . pantoprazole (PROTONIX) 40 MG tablet Take 1 tablet (40 mg total) by mouth 2 (two) times daily. 60 tablet 2  . polyethylene glycol (MIRALAX / GLYCOLAX) 17 g packet Take 17 g by mouth daily as needed for mild constipation. 14 each 0  .  vitamin B-12 (CYANOCOBALAMIN) 1000 MCG tablet Take 1,000 mcg by mouth daily.     No current facility-administered medications for this visit.    REVIEW OF SYSTEMS:    10 Point review of Systems was done is negative except as noted above.  PHYSICAL EXAMINATION: ECOG PERFORMANCE STATUS: 1 - Symptomatic but completely ambulatory  . Vitals:   08/24/20 1440  BP: 136/63  Pulse: 73  Resp: 18  Temp: 98.4 F (36.9 C)  SpO2: 100%   Filed Weights   08/24/20 1440  Weight: 202 lb 11.2 oz (91.9 kg)   .Body mass index is 27.49 kg/m.  GENERAL:alert, in no acute distress and comfortable SKIN: no acute rashes, no significant lesions EYES: conjunctiva are pink and non-injected, sclera anicteric OROPHARYNX: MMM, no exudates, no oropharyngeal erythema or ulceration NECK: supple, no JVD LYMPH:  no palpable lymphadenopathy in the cervical, axillary or inguinal regions LUNGS: clear to auscultation b/l with normal respiratory effort HEART: regular rate & rhythm ABDOMEN:  normoactive bowel sounds ,  non tender, not distended. Extremity: no pedal edema PSYCH: alert & oriented x 3 with fluent speech NEURO: no focal motor/sensory deficits  LABORATORY DATA:  I have reviewed the data as listed  . CBC Latest Ref Rng & Units 08/23/2020 08/22/2020 08/21/2020  WBC 4.0 - 10.5 K/uL 3.9(L) 3.7(L) 3.6(L)  Hemoglobin 13.0 - 17.0 g/dL 7.6(L) 7.4(L) 5.8(LL)  Hematocrit 39.0 - 52.0 % 25.1(L) 24.0(L) 19.5(L)  Platelets 150 - 400 K/uL 318 298 376    . CMP Latest Ref Rng & Units 08/23/2020 08/22/2020 08/21/2020  Glucose 70 - 99 mg/dL 101(H) 114(H) 124(H)  BUN 8 - 23 mg/dL 8 9 11   Creatinine 0.61 - 1.24 mg/dL 0.80 0.77 0.92  Sodium 135 - 145 mmol/L 135 133(L) 135  Potassium 3.5 - 5.1 mmol/L 3.7 3.6 4.0  Chloride 98 - 111 mmol/L 102 102 101  CO2 22 - 32 mmol/L 23 23 25   Calcium 8.9 - 10.3 mg/dL 8.4(L) 8.5(L) 8.9  Total Protein 6.5 - 8.1 g/dL 5.8(L) 5.9(L) 6.4(L)  Total Bilirubin 0.3 - 1.2 mg/dL 1.1 1.0 0.6  Alkaline Phos 38 - 126 U/L 69 72 79  AST 15 - 41 U/L 22 23 24   ALT 0 - 44 U/L 21 20 21     08/22/2020 Esophageal/Stomach Mass Surgical Pathology [phone]):   RADIOGRAPHIC STUDIES: I have personally reviewed the radiological images as listed and agreed with the findings in the report. CT CHEST W CONTRAST  Result Date: 08/22/2020 CLINICAL DATA:  64 year old male with GI cancer staging. EXAM: CT CHEST, ABDOMEN, AND PELVIS WITH CONTRAST TECHNIQUE: Multidetector CT imaging of the chest, abdomen and pelvis was performed following the standard protocol during bolus administration of intravenous contrast. CONTRAST:  168mL OMNIPAQUE IOHEXOL 300 MG/ML  SOLN COMPARISON:  CT abdomen pelvis dated 01/03/2004. FINDINGS: CT CHEST FINDINGS Cardiovascular: There is no cardiomegaly or pericardial effusion. There is coronary vascular calcification. Mild atherosclerotic calcification of the thoracic aorta. No aneurysmal dilatation or dissection. The origins of the great vessels of the aortic arch appear  patent as visualized. The central pulmonary arteries appear patent. Mediastinum/Nodes: No hilar or mediastinal adenopathy. Top-normal right hilar lymph node measures 9 mm. The esophagus and the thyroid gland are grossly unremarkable. No mediastinal fluid collection. Lungs/Pleura: Minimal left lung base dependent atelectasis. No focal consolidation, pleural effusion, pneumothorax. The central airways are patent. Musculoskeletal: Old healed left posterior rib fractures. No acute osseous pathology. CT ABDOMEN PELVIS FINDINGS No intra-abdominal free air or free fluid. Hepatobiliary: No  focal liver abnormality is seen. No gallstones, gallbladder wall thickening, or biliary dilatation. Pancreas: Unremarkable. No pancreatic ductal dilatation or surrounding inflammatory changes. Spleen: Normal in size without focal abnormality. Adrenals/Urinary Tract: The adrenal glands unremarkable. There is no hydronephrosis on either side. There is symmetric enhancement and excretion of contrast by both kidneys. The visualized ureters and urinary bladder appear unremarkable. Stomach/Bowel: There is sigmoid diverticulosis and scattered colonic diverticula without active inflammatory changes. There is thickened appearance of the wall of the stomach adjacent to the gastroesophageal junction as well as thickening of the proximal gastric wall. No evidence of gastric outlet obstruction. There is no bowel obstruction or active inflammation. Appendectomy. Vascular/Lymphatic: Advanced aortoiliac atherosclerotic disease. The IVC is unremarkable. No portal venous gas. Several small but mildly rounded gastrohepatic lymph nodes concerning for early nodal metastatic disease. Several additional mildly enlarged retroperitoneal lymph nodes along the abdominal aorta and IVC noted. A mildly enlarged lymph node anterior to the aorta measures 10 mm in short axis (91/3). Reproductive: The prostate and seminal vesicles are grossly unremarkable. Other: None  Musculoskeletal: Degenerative changes of the spine. No acute osseous pathology. IMPRESSION: 1. Thickened appearance of the wall of the stomach adjacent to the gastroesophageal junction as well as proximal gastric wall concerning for infiltrative neoplasm. 2. Rounded gastrohepatic lymph nodes as well as mildly enlarged retroperitoneal lymph nodes concerning for early nodal metastatic disease. 3. No evidence of metastatic disease in the chest. 4. Colonic diverticulosis. 5. Aortic Atherosclerosis (ICD10-I70.0). Electronically Signed   By: Anner Crete M.D.   On: 08/22/2020 20:03   CT ABDOMEN PELVIS W CONTRAST  Result Date: 08/22/2020 CLINICAL DATA:  64 year old male with GI cancer staging. EXAM: CT CHEST, ABDOMEN, AND PELVIS WITH CONTRAST TECHNIQUE: Multidetector CT imaging of the chest, abdomen and pelvis was performed following the standard protocol during bolus administration of intravenous contrast. CONTRAST:  132mL OMNIPAQUE IOHEXOL 300 MG/ML  SOLN COMPARISON:  CT abdomen pelvis dated 01/03/2004. FINDINGS: CT CHEST FINDINGS Cardiovascular: There is no cardiomegaly or pericardial effusion. There is coronary vascular calcification. Mild atherosclerotic calcification of the thoracic aorta. No aneurysmal dilatation or dissection. The origins of the great vessels of the aortic arch appear patent as visualized. The central pulmonary arteries appear patent. Mediastinum/Nodes: No hilar or mediastinal adenopathy. Top-normal right hilar lymph node measures 9 mm. The esophagus and the thyroid gland are grossly unremarkable. No mediastinal fluid collection. Lungs/Pleura: Minimal left lung base dependent atelectasis. No focal consolidation, pleural effusion, pneumothorax. The central airways are patent. Musculoskeletal: Old healed left posterior rib fractures. No acute osseous pathology. CT ABDOMEN PELVIS FINDINGS No intra-abdominal free air or free fluid. Hepatobiliary: No focal liver abnormality is seen. No  gallstones, gallbladder wall thickening, or biliary dilatation. Pancreas: Unremarkable. No pancreatic ductal dilatation or surrounding inflammatory changes. Spleen: Normal in size without focal abnormality. Adrenals/Urinary Tract: The adrenal glands unremarkable. There is no hydronephrosis on either side. There is symmetric enhancement and excretion of contrast by both kidneys. The visualized ureters and urinary bladder appear unremarkable. Stomach/Bowel: There is sigmoid diverticulosis and scattered colonic diverticula without active inflammatory changes. There is thickened appearance of the wall of the stomach adjacent to the gastroesophageal junction as well as thickening of the proximal gastric wall. No evidence of gastric outlet obstruction. There is no bowel obstruction or active inflammation. Appendectomy. Vascular/Lymphatic: Advanced aortoiliac atherosclerotic disease. The IVC is unremarkable. No portal venous gas. Several small but mildly rounded gastrohepatic lymph nodes concerning for early nodal metastatic disease. Several additional mildly enlarged retroperitoneal lymph nodes  along the abdominal aorta and IVC noted. A mildly enlarged lymph node anterior to the aorta measures 10 mm in short axis (91/3). Reproductive: The prostate and seminal vesicles are grossly unremarkable. Other: None Musculoskeletal: Degenerative changes of the spine. No acute osseous pathology. IMPRESSION: 1. Thickened appearance of the wall of the stomach adjacent to the gastroesophageal junction as well as proximal gastric wall concerning for infiltrative neoplasm. 2. Rounded gastrohepatic lymph nodes as well as mildly enlarged retroperitoneal lymph nodes concerning for early nodal metastatic disease. 3. No evidence of metastatic disease in the chest. 4. Colonic diverticulosis. 5. Aortic Atherosclerosis (ICD10-I70.0). Electronically Signed   By: Anner Crete M.D.   On: 08/22/2020 20:03    ASSESSMENT & PLAN:   64 yo male  with no significant smoking or tobacco use h/o but with chronic reflux and etoh use with   1) Newly diagnosed GE junction poorly differentiated with extracellular mucin and signet ring cells. [id number] No overt obstructive symptoms at this time 2) GI bleeding- melena with symptomatic anemia s/p PRBC transfusion 3) Iron deficiency anemia from GI bleeding - additional IV iron ordered PLAN: -Discussed patient's most recent labs from 08/23/2020, all values are WNL except for WBC at 3.9K, RBC at 2.93, Hgb at 7.6, HCT at 25.1, MCH at 25.9, RDW at 16.4, Glucose at 101, Calcium at 8.4, Total Protein at 5.8, Albumin at 3.2. -Discussed 08/22/2020 Esophageal/Stomach Mass Surgical Pathology [phone]) which revealed "A. ESOPHAGUS, DISTAL MASS, BIOPSY:  - Poorly differentiated adenocarcinoma with extracellular mucin and  signet ring cells. B. STOMACH, MASS, BIOPSY: - Poorly differentiated adenocarcinoma with extracellular mucin and signet ring cells."  -Discussed 08/22/2020 CT C/A/P ([id number]) ([id number]) which revealed "1. Thickened appearance of the wall of the stomach adjacent to the gastroesophageal junction as well as proximal gastric wall concerning for infiltrative neoplasm. 2. Rounded gastrohepatic lymph nodes as well as mildly enlarged retroperitoneal lymph nodes concerning for early nodal metastatic disease. 3. No evidence of metastatic disease in the chest. -Advised pt that black, tarry stools typically suggests bleeding from the Upper GI tract.  -Advised pt that he has a poorly-differentiated Adenocarcinoma, which suggests aggressive behavior.  -Advised pt that if disease is localized and there is no microscopic spread there is a chance for long-term non-progression with Tx. -Recommend that we begin treatment of the primary mass with chemotherapy + radiation ASAP while waiting for surgical consideration. -Advised pt that mass resectability, degree of spread, treatment response, and molecular  markers all factor into health outcomes.  -Advised pt that we will need to replace Iron IV and potentially aggressively replace Vitamin B12 to minimize anemia.  -Will get PET/CT in 5 days to r/o spread of disease to the lymph nodes. -Urgent referral to RadOnc for localized radiation of GEJ mass.  -Will refer pt to Dr. Barry Dienes at [place] refer pt to IR for Port-a-cath placement -Plan to begin Carboplatin + Taxol in 7-10 days -Will give chemo-counseling for Carboplatin + Taxol -Will see back with C2 with labs for a toxicity check -Will get IV Injectafer weekly x2   FOLLOW UP: Referral to radiation oncology urgently Referral to Dr Barry Dienes -[facility] Chemo-counseling for carboplatin/Taxol with RT IV Injectafer weekly x 2 doses Port a cath placement by IR PET/CT in 5 days Referral to nutritional therapy Schedule to start weekly carboplatin/Taxol in 7-10 days with labs MD visit with C2 of Carboplatin/taxol with labs for toxicity check   All of the patients questions were answered with apparent satisfaction.  The patient knows to call the clinic with any problems, questions or concerns.  I spent 55 min counseling the patient face to face. The total time spent in the appointment was 63 mintues and more than 50% was on counseling and direct patient cares.    Sullivan Lone MD [place] AAHIVMS [facility] Hematology/Oncology [facility]  (Office):       [phone] (Work cell):  [phone] (Fax):           [phone]  08/24/2020 4:13 PM  I, Yevette Edwards, am acting as a scribe for Dr. Sullivan Lone.   .I have reviewed the above documentation for accuracy and completeness, and I agree with the above. Brunetta Genera MD

## 2020-08-28 DIAGNOSIS — C158 Malignant neoplasm of overlapping sites of esophagus: Secondary | ICD-10-CM | POA: Insufficient documentation

## 2020-08-28 MED ORDER — LORAZEPAM 0.5 MG PO TABS: 0.5000 mg | ORAL_TABLET | Freq: Four times a day (QID) | ORAL | 0 refills | Status: DC | PRN

## 2020-08-28 MED ORDER — DEXAMETHASONE 4 MG PO TABS: 8.0000 mg | ORAL_TABLET | Freq: Every day | ORAL | 1 refills | Status: DC

## 2020-08-28 MED ORDER — ONDANSETRON HCL 8 MG PO TABS: 8.0000 mg | ORAL_TABLET | Freq: Two times a day (BID) | ORAL | 1 refills | Status: DC | PRN

## 2020-08-28 MED ORDER — PROCHLORPERAZINE MALEATE 10 MG PO TABS: 10.0000 mg | ORAL_TABLET | Freq: Four times a day (QID) | ORAL | 1 refills | Status: DC | PRN

## 2020-08-28 MED ORDER — LIDOCAINE-PRILOCAINE 2.5-2.5 % EX CREA: TOPICAL_CREAM | CUTANEOUS | 3 refills | Status: DC

## 2020-08-28 NOTE — Progress Notes (Signed)
GI Location of Tumor / Histology: Gastroesophageal junction adenocarcinoma  Donald Wade presented with 1.5 month history of black tarry stools, dizziness, and feeling lightheaded.  He denied any difficulty swallowing and is taking measures to eat small bites.  He has had noticeable acid reflux in the last 3 months that has worsened recently.  Port Insertion 09/07/2020:  PET 09/06/2020:  Upper Endoscopy 08/22/2020:   CT CAP 08/22/2020: Thickened appearance of the wall of the stomach adjacent to the gastroesophageal junction as well as proximal gastric wall concerning for infiltrative neoplasm.  Rounded gastrohepatic lymph nodes as well as mildly enlarged retroperitoneal lymph nodes concerning for early nodal metastatic disease. No evidence of metastatic disease in the chest.   Biopsies of Distal Esophagus 08/22/2020   Past/Anticipated interventions by surgeon, if any:  Dr. Barry Dienes  -No appointment scheduled at this time.  Past/Anticipated interventions by medical oncology, if any:  Dr. Irene Limbo 08/24/2020 -Advised pt that he has a poorly-differentiated Adenocarcinoma, which suggests aggressive behavior.  -Advised pt that if disease is localized and there is no microscopic spread there is a chance for long-term non-progression with Tx. -Recommend that we begin treatment of the primary mass with chemotherapy + radiation ASAP while waiting for surgical consideration. -Advised pt that mass resectability, degree of spread, treatment response, and molecular markers all factor into health outcomes.  -Will get PET/CT in 5 days to r/o spread of disease to the lymph nodes. -Urgent referral to RadOnc for localized radiation of GEJ mass.  -Will refer pt to Dr. Barry Dienes at [facility], [facility].   Weight changes, if any: Has lost a couple of pounds since this started.  About 5 pounds.  Swallowing: Has some difficulty.  Has to eat slowly.  Acid reflux is better after starting protonix.  Has  really bad hiccups.  Eating more softer foods/soups.  States he can eat meats if he cuts it up and takes his time.  Bowel/Bladder complaints, if any: No.  No more dark tarry stools.  Nausea / Vomiting, if any: Has some nausea  Pain issues, if any:  No   SAFETY ISSUES:  Prior radiation? No  Pacemaker/ICD? No  Possible current pregnancy? n/a  Is the patient on methotrexate? No  Current Complaints/Details:

## 2020-08-28 NOTE — Progress Notes (Signed)
START ON PATHWAY REGIMEN - Gastroesophageal     Administer weekly during RT:     Paclitaxel      Carboplatin   **Always confirm dose/schedule in your pharmacy ordering system**  Patient Characteristics: Esophageal & GE Junction, Adenocarcinoma, Preoperative or Nonsurgical Candidate (Clinical Staging), cT2 or Higher or cN+, Surgical Candidate (Up to cT4a) - Preoperative Therapy, GE Junction Histology: Adenocarcinoma Disease Classification: GE Junction Therapeutic Status: Preoperative or Nonsurgical Candidate (Clinical Staging) AJCC Grade: G3 AJCC 8 Stage Grouping: Unknown AJCC T Category: cTX AJCC N Category: cN1 AJCC M Category: cM0 Intent of Therapy: Non-Curative / Palliative Intent, Discussed with Patient

## 2020-08-29 ENCOUNTER — Ambulatory Visit
Admission: RE | Admit: 2020-08-29 | Discharge: 2020-08-29 | Disposition: A | Discharge status: Home / Self Care | Payer: No Typology Code available for payment source | Source: Ambulatory Visit | Attending: Radiation Oncology | Admitting: Radiation Oncology

## 2020-08-29 ENCOUNTER — Encounter: Payer: Self-pay | Admitting: Radiation Oncology

## 2020-08-29 ENCOUNTER — Other Ambulatory Visit: Payer: Self-pay | Admitting: Hematology

## 2020-08-29 ENCOUNTER — Other Ambulatory Visit: Payer: Self-pay

## 2020-08-29 ENCOUNTER — Telehealth: Payer: Self-pay | Admitting: Hematology

## 2020-08-29 VITALS — BP 143/75 | HR 69 | Temp 97.9°F | Resp 18 | Wt 202.6 lb

## 2020-08-29 DIAGNOSIS — C158 Malignant neoplasm of overlapping sites of esophagus: Secondary | ICD-10-CM

## 2020-08-29 DIAGNOSIS — D500 Iron deficiency anemia secondary to blood loss (chronic): Secondary | ICD-10-CM | POA: Insufficient documentation

## 2020-08-29 NOTE — Progress Notes (Signed)
Injectafer switched to venofer per insurance recommendations

## 2020-08-29 NOTE — Progress Notes (Signed)
Radiation Oncology         [phone] ________________________________  Name: Donald Wade        MRN: [id number]  Date of Service: 08/29/2020 DOB: March 22, 1956  SR:PRXYV, Sherrill Raring, NP  Brunetta Genera, MD     REFERRING PHYSICIAN: Brunetta Genera, MD   DIAGNOSIS: The encounter diagnosis was Malignant neoplasm of overlapping sites of esophagus [facility]).   HISTORY OF PRESENT ILLNESS: [name] is a 64 y.o. male seen at the request of Dr. 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  The patient presented with a little over a month and a half history of tarry stools dizziness and feeling lightheaded, he has had increasing symptoms of acid reflux for several months prior, and was evaluated in the hospital setting with a hgb of 6. He received 2 units of PRBCs and was seen by gastroenterology.  He underwent upper endoscopy on 08/22/2020 which revealed a mass that was described as small protuberant slightly ulcerated without evidence of bleeding in the lower third of the esophagus at the GE junction it was nonobstructing and noncircumferential, a medium size fungating and ulcerated noncircumferential mass was also identified in the gastric cardia the lower gastric body gastric antrum and prepyloric region of the stomach were normal as well as the duodenal bulb as well as normal appearance of the first through third portions of the duodenum.  Biopsies in the distal esophagus and stomach masses consistently showed poorly differentiated adenocarcinoma with extracellular mucin and signet ring cells. He is scheduled for a PET scan but did have a CT CAP on 08/22/20 that revealed thickening of the distal esophagus and proximal stomach, there was concern for adenopathy in the gastrohepatic ligament chain and of the retroperitoneal nodes along the abdominal aorta and IVC.  He is seen today to discuss options of chemoradiation.  He is also getting set up to  meet with Dr. Barry Dienes.  PREVIOUS RADIATION THERAPY: No   PAST MEDICAL HISTORY:  Past Medical History:  Diagnosis Date  . Anemia 08/22/2020  . Heart murmur   . Hyperlipidemia   . Hypertension        PAST SURGICAL HISTORY: Past Surgical History:  Procedure Laterality Date  . APPENDECTOMY  1980  . BIOPSY  08/22/2020   Procedure: BIOPSY;  Surgeon: Clarene Essex, MD;  Location: [facility] ENDOSCOPY;  Service: Endoscopy;;  . ESOPHAGOGASTRODUODENOSCOPY  08/22/2020  . ESOPHAGOGASTRODUODENOSCOPY N/A 08/22/2020   Procedure: ESOPHAGOGASTRODUODENOSCOPY (EGD);  Surgeon: Clarene Essex, MD;  Location: [place];  Service: Endoscopy;  Laterality: N/A;  . LEFT HEART CATH AND CORONARY ANGIOGRAPHY N/A 11/23/2019   Procedure: LEFT HEART CATH AND CORONARY ANGIOGRAPHY;  Surgeon: Nigel Mormon, MD;  Location: [place] [facility];  Service: Cardiovascular;  Laterality: N/A;     FAMILY HISTORY:  Family History  Problem Relation Age of Onset  . Heart disease Mother   . Atrial fibrillation Mother      SOCIAL HISTORY:  reports that he has never smoked. He has never used smokeless tobacco. He reports current alcohol use. He reports that he does not use drugs. The patient is married and lives in [place]. He works in Press photographer and travels often. His daughter is a Software engineer and works in [place].   ALLERGIES: Bee venom and Penicillin g   MEDICATIONS:  Current Outpatient Medications  Medication Sig Dispense Refill  . acetaminophen (TYLENOL) 325 MG tablet Take 650 mg by mouth every 6 (six) hours as needed  for mild pain.    Marland Kitchen amLODipine (NORVASC) 5 MG tablet Take 1 tablet (5 mg total) by mouth daily. 30 tablet 0  . atorvastatin (LIPITOR) 40 MG tablet Take 1 tablet (40 mg total) by mouth daily. 90 tablet 3  . buPROPion (WELLBUTRIN XL) 150 MG 24 hr tablet Take 150 mg by mouth daily.     Marland Kitchen dexamethasone (DECADRON) 4 MG tablet Take 2 tablets (8 mg total) by mouth daily. Start the day after chemotherapy for 2  days. 30 tablet 1  . EPINEPHrine 0.3 mg/0.3 mL IJ SOAJ injection Inject 0.3 mg into the skin as needed for anaphylaxis (call 911).    . feeding supplement (ENSURE ENLIVE / ENSURE PLUS) LIQD Take 237 mLs by mouth 2 (two) times daily between meals. 237 mL 12  . fexofenadine (ALLEGRA) 180 MG tablet Take 180 mg by mouth daily as needed for allergies.    Marland Kitchen lidocaine-prilocaine (EMLA) cream Apply to affected area once 30 g 3  . LORazepam (ATIVAN) 0.5 MG tablet Take 1 tablet (0.5 mg total) by mouth every 6 (six) hours as needed (Nausea or vomiting). 30 tablet 0  . metoprolol succinate (TOPROL XL) 50 MG 24 hr tablet Take 1 tablet (50 mg total) by mouth daily. (Patient taking differently: Take 25 mg by mouth daily.) 90 tablet 2  . Multiple Vitamin (MULTI-VITAMIN) tablet Take 1 tablet by mouth daily.    . Omega-3 1000 MG CAPS Take 1,000 mg by mouth once a week.    . ondansetron (ZOFRAN) 8 MG tablet Take 1 tablet (8 mg total) by mouth 2 (two) times daily as needed for refractory nausea / vomiting. Start on day 3 after chemo. 30 tablet 1  . pantoprazole (PROTONIX) 40 MG tablet Take 1 tablet (40 mg total) by mouth 2 (two) times daily. 60 tablet 2  . polyethylene glycol (MIRALAX / GLYCOLAX) 17 g packet Take 17 g by mouth daily as needed for mild constipation. 14 each 0  . prochlorperazine (COMPAZINE) 10 MG tablet Take 1 tablet (10 mg total) by mouth every 6 (six) hours as needed (Nausea or vomiting). 30 tablet 1  . vitamin B-12 (CYANOCOBALAMIN) 1000 MCG tablet Take 1,000 mcg by mouth daily.     No current facility-administered medications for this encounter.     REVIEW OF SYSTEMS: On review of systems, the patient reports that he is doing fairly well. He is having indigestion which has improved with using proton pump inhibitor therapy.  He feels so much better since receiving blood products and iron.  He reports he is not seeing the dark tarry stool, and has stopped taking blood thinners.  He is able to eat  pretty much anything he desires, he denies any abdominal pain nausea or vomiting.  He has lost about 6 to 10 pounds overall in the last few months.  No other complaints are verbalized.    PHYSICAL EXAM:  Wt Readings from Last 3 Encounters:  08/29/20 202 lb 9.6 oz (91.9 kg)  08/24/20 202 lb 11.2 oz (91.9 kg)  08/22/20 205 lb (93 kg)   Temp Readings from Last 3 Encounters:  08/29/20 97.9 F (36.6 C) (Temporal)  08/24/20 98.4 F (36.9 C) (Tympanic)  08/23/20 98.3 F (36.8 C) (Oral)   BP Readings from Last 3 Encounters:  08/29/20 (!) 143/75  08/24/20 136/63  08/23/20 (!) 129/48   Pulse Readings from Last 3 Encounters:  08/29/20 69  08/24/20 73  08/23/20 76   Pain Assessment Pain Score: 0-No  pain/10  In general this is a well appearing Caucasian male in no acute distress. He's alert and oriented x4 and appropriate throughout the examination. Cardiopulmonary assessment is negative for acute distress and he exhibits normal effort.    ECOG = 1  0 - Asymptomatic (Fully active, able to carry on all predisease activities without restriction)  1 - Symptomatic but completely ambulatory (Restricted in physically strenuous activity but ambulatory and able to carry out work of a light or sedentary nature. For example, light housework, office work)  2 - Symptomatic, <50% in bed during the day (Ambulatory and capable of all self care but unable to carry out any work activities. Up and about more than 50% of waking hours)  3 - Symptomatic, >50% in bed, but not bedbound (Capable of only limited self-care, confined to bed or chair 50% or more of waking hours)  4 - Bedbound (Completely disabled. Cannot carry on any self-care. Totally confined to bed or chair)  5 - Death   Eustace Pen MM, Creech RH, Tormey DC, et al. [phone]). "Toxicity and response criteria of the [facility]". [place] Oncol. 5 (6): 649-55    LABORATORY DATA:  Lab Results  Component Value Date   WBC  3.9 (L) 08/23/2020   HGB 7.6 (L) 08/23/2020   HCT 25.1 (L) 08/23/2020   MCV 85.7 08/23/2020   PLT 318 08/23/2020   Lab Results  Component Value Date   NA 135 08/23/2020   K 3.7 08/23/2020   CL 102 08/23/2020   CO2 23 08/23/2020   Lab Results  Component Value Date   ALT 21 08/23/2020   AST 22 08/23/2020   ALKPHOS 69 08/23/2020   BILITOT 1.1 08/23/2020      RADIOGRAPHY: CT CHEST W CONTRAST  Result Date: 08/22/2020 CLINICAL DATA:  64 year old male with GI cancer staging. EXAM: CT CHEST, ABDOMEN, AND PELVIS WITH CONTRAST TECHNIQUE: Multidetector CT imaging of the chest, abdomen and pelvis was performed following the standard protocol during bolus administration of intravenous contrast. CONTRAST:  155mL OMNIPAQUE IOHEXOL 300 MG/ML  SOLN COMPARISON:  CT abdomen pelvis dated 01/03/2004. FINDINGS: CT CHEST FINDINGS Cardiovascular: There is no cardiomegaly or pericardial effusion. There is coronary vascular calcification. Mild atherosclerotic calcification of the thoracic aorta. No aneurysmal dilatation or dissection. The origins of the great vessels of the aortic arch appear patent as visualized. The central pulmonary arteries appear patent. Mediastinum/Nodes: No hilar or mediastinal adenopathy. Top-normal right hilar lymph node measures 9 mm. The esophagus and the thyroid gland are grossly unremarkable. No mediastinal fluid collection. Lungs/Pleura: Minimal left lung base dependent atelectasis. No focal consolidation, pleural effusion, pneumothorax. The central airways are patent. Musculoskeletal: Old healed left posterior rib fractures. No acute osseous pathology. CT ABDOMEN PELVIS FINDINGS No intra-abdominal free air or free fluid. Hepatobiliary: No focal liver abnormality is seen. No gallstones, gallbladder wall thickening, or biliary dilatation. Pancreas: Unremarkable. No pancreatic ductal dilatation or surrounding inflammatory changes. Spleen: Normal in size without focal abnormality.  Adrenals/Urinary Tract: The adrenal glands unremarkable. There is no hydronephrosis on either side. There is symmetric enhancement and excretion of contrast by both kidneys. The visualized ureters and urinary bladder appear unremarkable. Stomach/Bowel: There is sigmoid diverticulosis and scattered colonic diverticula without active inflammatory changes. There is thickened appearance of the wall of the stomach adjacent to the gastroesophageal junction as well as thickening of the proximal gastric wall. No evidence of gastric outlet obstruction. There is no bowel obstruction or active inflammation. Appendectomy. Vascular/Lymphatic: Advanced  aortoiliac atherosclerotic disease. The IVC is unremarkable. No portal venous gas. Several small but mildly rounded gastrohepatic lymph nodes concerning for early nodal metastatic disease. Several additional mildly enlarged retroperitoneal lymph nodes along the abdominal aorta and IVC noted. A mildly enlarged lymph node anterior to the aorta measures 10 mm in short axis (91/3). Reproductive: The prostate and seminal vesicles are grossly unremarkable. Other: None Musculoskeletal: Degenerative changes of the spine. No acute osseous pathology. IMPRESSION: 1. Thickened appearance of the wall of the stomach adjacent to the gastroesophageal junction as well as proximal gastric wall concerning for infiltrative neoplasm. 2. Rounded gastrohepatic lymph nodes as well as mildly enlarged retroperitoneal lymph nodes concerning for early nodal metastatic disease. 3. No evidence of metastatic disease in the chest. 4. Colonic diverticulosis. 5. Aortic Atherosclerosis (ICD10-I70.0). Electronically Signed   By: Anner Crete M.D.   On: 08/22/2020 20:03   CT ABDOMEN PELVIS W CONTRAST  Result Date: 08/22/2020 CLINICAL DATA:  64 year old male with GI cancer staging. EXAM: CT CHEST, ABDOMEN, AND PELVIS WITH CONTRAST TECHNIQUE: Multidetector CT imaging of the chest, abdomen and pelvis was  performed following the standard protocol during bolus administration of intravenous contrast. CONTRAST:  118mL OMNIPAQUE IOHEXOL 300 MG/ML  SOLN COMPARISON:  CT abdomen pelvis dated 01/03/2004. FINDINGS: CT CHEST FINDINGS Cardiovascular: There is no cardiomegaly or pericardial effusion. There is coronary vascular calcification. Mild atherosclerotic calcification of the thoracic aorta. No aneurysmal dilatation or dissection. The origins of the great vessels of the aortic arch appear patent as visualized. The central pulmonary arteries appear patent. Mediastinum/Nodes: No hilar or mediastinal adenopathy. Top-normal right hilar lymph node measures 9 mm. The esophagus and the thyroid gland are grossly unremarkable. No mediastinal fluid collection. Lungs/Pleura: Minimal left lung base dependent atelectasis. No focal consolidation, pleural effusion, pneumothorax. The central airways are patent. Musculoskeletal: Old healed left posterior rib fractures. No acute osseous pathology. CT ABDOMEN PELVIS FINDINGS No intra-abdominal free air or free fluid. Hepatobiliary: No focal liver abnormality is seen. No gallstones, gallbladder wall thickening, or biliary dilatation. Pancreas: Unremarkable. No pancreatic ductal dilatation or surrounding inflammatory changes. Spleen: Normal in size without focal abnormality. Adrenals/Urinary Tract: The adrenal glands unremarkable. There is no hydronephrosis on either side. There is symmetric enhancement and excretion of contrast by both kidneys. The visualized ureters and urinary bladder appear unremarkable. Stomach/Bowel: There is sigmoid diverticulosis and scattered colonic diverticula without active inflammatory changes. There is thickened appearance of the wall of the stomach adjacent to the gastroesophageal junction as well as thickening of the proximal gastric wall. No evidence of gastric outlet obstruction. There is no bowel obstruction or active inflammation. Appendectomy.  Vascular/Lymphatic: Advanced aortoiliac atherosclerotic disease. The IVC is unremarkable. No portal venous gas. Several small but mildly rounded gastrohepatic lymph nodes concerning for early nodal metastatic disease. Several additional mildly enlarged retroperitoneal lymph nodes along the abdominal aorta and IVC noted. A mildly enlarged lymph node anterior to the aorta measures 10 mm in short axis (91/3). Reproductive: The prostate and seminal vesicles are grossly unremarkable. Other: None Musculoskeletal: Degenerative changes of the spine. No acute osseous pathology. IMPRESSION: 1. Thickened appearance of the wall of the stomach adjacent to the gastroesophageal junction as well as proximal gastric wall concerning for infiltrative neoplasm. 2. Rounded gastrohepatic lymph nodes as well as mildly enlarged retroperitoneal lymph nodes concerning for early nodal metastatic disease. 3. No evidence of metastatic disease in the chest. 4. Colonic diverticulosis. 5. Aortic Atherosclerosis (ICD10-I70.0). Electronically Signed   By: Anner Crete M.D.   On:  08/22/2020 20:03       IMPRESSION/PLAN: 1.  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 Dr. Lisbeth Renshaw discusses the pathology findings and reviews the nature of adenocarcinomas of the upper GI tract, he discusses the rationale for proceeding with staging PET scan, it looks as though based on his CT results that he has at least locally advanced disease involving the gastrohepatic ligament lymph node chain, and possibly lower into the retroperitoneal space.  Dr. Lisbeth Renshaw discusses that if he did have metastatic disease outside of this area, palliative radiotherapy could be considered however our hope is that there is no additional disease outside of what is currently known by CT scan and that he would be a good candidate for chemoradiation.  We discussed the risks, benefits, short, and long term effects of radiotherapy, as well as the curative  intent, and the patient is interested in proceeding. Dr. Lisbeth Renshaw discusses the delivery and logistics of radiotherapy and anticipates a course of 5 1/2 weeks of radiotherapy. Written consent is obtained and placed in the chart, a copy was provided to the patient.  He will simulate on Thursday of this week following his PET scan since we were able to move this up.  Based on the timing now it appears that he might be able to start chemoradiation if appropriate based on PET scan, and this course would begin on 09/11/2020.  In a visit lasting 60 minutes, greater than 50% of the time was spent face to face discussing the patient's condition, in preparation for the discussion, and coordinating the patient's care.   The above documentation reflects my direct findings during this shared patient visit. Please see the separate note by Dr. Lisbeth Renshaw on this date for the remainder of the patient's plan of care.    Carola Rhine, PAC

## 2020-08-29 NOTE — Telephone Encounter (Signed)
Scheduled per 12/16 los, patient has been called and voicemail was left regarding upcoming appointments.

## 2020-08-30 ENCOUNTER — Telehealth: Payer: Self-pay | Admitting: *Deleted

## 2020-08-30 ENCOUNTER — Inpatient Hospital Stay: Payer: No Typology Code available for payment source

## 2020-08-30 ENCOUNTER — Other Ambulatory Visit: Payer: Self-pay

## 2020-08-30 VITALS — BP 140/78 | HR 66 | Temp 98.4°F | Resp 16

## 2020-08-30 DIAGNOSIS — D500 Iron deficiency anemia secondary to blood loss (chronic): Secondary | ICD-10-CM

## 2020-08-30 DIAGNOSIS — C160 Malignant neoplasm of cardia: Secondary | ICD-10-CM | POA: Diagnosis not present

## 2020-08-30 DIAGNOSIS — C158 Malignant neoplasm of overlapping sites of esophagus: Secondary | ICD-10-CM

## 2020-08-30 MED ORDER — LORATADINE 10 MG PO TABS
ORAL_TABLET | ORAL | Status: AC
  Filled 2020-08-30: qty 1

## 2020-08-30 MED ORDER — SODIUM CHLORIDE 0.9 % IV SOLN
200.0000 mg | Freq: Once | INTRAVENOUS | Status: AC
  Administered 2020-08-30: 08:00:00 200 mg via INTRAVENOUS
  Filled 2020-08-30: qty 200

## 2020-08-30 MED ORDER — ACETAMINOPHEN 325 MG PO TABS
650.0000 mg | ORAL_TABLET | Freq: Once | ORAL | Status: AC
  Administered 2020-08-30: 08:00:00 650 mg via ORAL

## 2020-08-30 MED ORDER — ACETAMINOPHEN 325 MG PO TABS
ORAL_TABLET | ORAL | Status: AC
  Filled 2020-08-30: qty 2

## 2020-08-30 MED ORDER — SODIUM CHLORIDE 0.9 % IV SOLN
Freq: Once | INTRAVENOUS | Status: AC
  Filled 2020-08-30: qty 250

## 2020-08-30 MED ORDER — LORATADINE 10 MG PO TABS
10.0000 mg | ORAL_TABLET | Freq: Once | ORAL | Status: AC
  Administered 2020-08-30: 08:00:00 10 mg via ORAL

## 2020-08-30 NOTE — Patient Instructions (Signed)

## 2020-08-30 NOTE — Telephone Encounter (Signed)
Spoke with the patient to let him know that he is scheduled for PET scan 08/31/2020 at 0800 am and then CT simulation right after.  Her verbalized understanding.    Gloriajean Dell. Leonie Green, BSN

## 2020-08-30 NOTE — Progress Notes (Signed)
Left message for patient with my direct contact number.  Also to let him know that his first chemo treatment is going to be the week of 09/11/2020 along with start of RT.  I asked him to call me back should he have any questions or if I can be help.

## 2020-08-31 ENCOUNTER — Telehealth: Payer: Self-pay | Admitting: Radiation Oncology

## 2020-08-31 ENCOUNTER — Encounter: Payer: Self-pay | Admitting: *Deleted

## 2020-08-31 ENCOUNTER — Ambulatory Visit
Admission: RE | Admit: 2020-08-31 | Discharge: 2020-08-31 | Disposition: A | Discharge status: Home / Self Care | Payer: No Typology Code available for payment source | Source: Ambulatory Visit | Attending: Radiation Oncology | Admitting: Radiation Oncology

## 2020-08-31 ENCOUNTER — Ambulatory Visit (HOSPITAL_COMMUNITY)
Admission: RE | Admit: 2020-08-31 | Discharge: 2020-08-31 | Disposition: A | Discharge status: Home / Self Care | Payer: No Typology Code available for payment source | Source: Ambulatory Visit | Attending: Hematology | Admitting: Hematology

## 2020-08-31 ENCOUNTER — Telehealth: Payer: Self-pay | Admitting: Hematology

## 2020-08-31 ENCOUNTER — Other Ambulatory Visit: Payer: Self-pay | Admitting: Hematology

## 2020-08-31 DIAGNOSIS — C155 Malignant neoplasm of lower third of esophagus: Secondary | ICD-10-CM | POA: Diagnosis present

## 2020-08-31 DIAGNOSIS — C158 Malignant neoplasm of overlapping sites of esophagus: Secondary | ICD-10-CM | POA: Diagnosis present

## 2020-08-31 DIAGNOSIS — Z510 Encounter for antineoplastic radiation therapy: Secondary | ICD-10-CM | POA: Diagnosis not present

## 2020-08-31 LAB — GLUCOSE, CAPILLARY: Glucose-Capillary: 101 mg/dL — ABNORMAL HIGH (ref 70–99)

## 2020-08-31 MED ORDER — FLUDEOXYGLUCOSE F - 18 (FDG) INJECTION
10.0900 | Freq: Once | INTRAVENOUS | Status: AC
  Administered 2020-08-31: 09:00:00 10.09 via INTRAVENOUS

## 2020-08-31 NOTE — Telephone Encounter (Signed)
Called.

## 2020-08-31 NOTE — Telephone Encounter (Signed)
I called and let the patient know the results of his PET scan we will plan to proceed with chemoRT and Dr. Barry Dienes recommends a repeat PET scan following completion of chemoRT to determine options for surgical resection.

## 2020-08-31 NOTE — Progress Notes (Signed)
[facility]  Initial Assessment   Donald Wade is a 64 y.o. year old male . Clinical Social Work was referred by distress screening protocol for assessment of psychosocial needs.  Donald Wade reported he has attended multiple appointments in the past week and is starting treatment soon. CSW discussed benefit of starting treatment quickly, but also difficulty allowing patient time to process diagnosis and information. CSW and patient discussed taking this situation "day by day".  Patient indicated feeling relief after learning his PET scan did not share additional cancer growth.  SDOH (Social Determinants of Health) assessments performed: No   Distress Screen completed: Yes ONCBCN DISTRESS SCREENING 08/30/2020  Screening Type Initial Screening  Distress experienced in past week (1-10) 5  Practical problem type Insurance;Work/school  Family Problem type -  Emotional problem type Depression;Nervousness/Anxiety  Spiritual/Religous concerns type Facing my mortality  Physical Problem type Nausea/vomiting;Loss of appetitie  Referral to clinical social work Yes  Other -      Family/Social Information:  . Housing Arrangement: patient lives with spouse  . Family members/support persons in your life? Family and Friends/Colleagues; has two children in their late 47's . Transportation concerns: no  . Employment: Working full time. Income source: Employment - discussed possible early retirement or social security disability . Financial concerns: No o Type of concern: None . Food access concerns: no . Religious or spiritual practice: none identified . Medication Concerns: no  . Services Currently in place:  None identified  Coping/ Adjustment to diagnosis: . Patient understands treatment plan and what happens next? yes . Concerns about diagnosis and/or treatment: Overwhelmed by information and uncertainty of cancer diagnosis . Patient reported stressors: Work/ school and  Adjusting to my illness . Hopes and priorities: to focus on beginning treatment . Patient enjoys did not identify . Current coping skills/ strengths: Ability for insight, Average or above average intelligence, Capable of independent living, Communication skills, Scientist, research (life sciences), Motivation for treatment/growth, Supportive family/friends and Work skills    SUMMARY: Current SDOH Barriers:  . None identified  Clinical Social Work Clinical Goal(s):  Marland Kitchen No goals identified at this time.  Interventions: . Discussed common feeling and emotions when being diagnosed with cancer, and the importance of support during treatment . Informed patient of the support team roles and support services at [facility] . Provided CSW contact information and encouraged patient to call with any questions or concerns    Follow Up Plan: Patient will contact CSW with any support or resource needs Patient verbalizes understanding of plan: Yes    Donald Wade , LCSW

## 2020-09-04 ENCOUNTER — Inpatient Hospital Stay: Payer: No Typology Code available for payment source

## 2020-09-04 ENCOUNTER — Other Ambulatory Visit: Payer: Self-pay

## 2020-09-04 VITALS — BP 128/72 | HR 68 | Temp 98.2°F | Resp 18

## 2020-09-04 DIAGNOSIS — C160 Malignant neoplasm of cardia: Secondary | ICD-10-CM | POA: Diagnosis not present

## 2020-09-04 DIAGNOSIS — C158 Malignant neoplasm of overlapping sites of esophagus: Secondary | ICD-10-CM

## 2020-09-04 DIAGNOSIS — D500 Iron deficiency anemia secondary to blood loss (chronic): Secondary | ICD-10-CM

## 2020-09-04 MED ORDER — LORATADINE 10 MG PO TABS
10.0000 mg | ORAL_TABLET | Freq: Once | ORAL | Status: AC
  Administered 2020-09-04: 08:00:00 10 mg via ORAL

## 2020-09-04 MED ORDER — SODIUM CHLORIDE 0.9 % IV SOLN
Freq: Once | INTRAVENOUS | Status: DC
  Filled 2020-09-04: qty 250

## 2020-09-04 MED ORDER — ACETAMINOPHEN 325 MG PO TABS
650.0000 mg | ORAL_TABLET | Freq: Once | ORAL | Status: AC
  Administered 2020-09-04: 08:00:00 650 mg via ORAL

## 2020-09-04 MED ORDER — SODIUM CHLORIDE 0.9 % IV SOLN
200.0000 mg | Freq: Once | INTRAVENOUS | Status: AC
  Administered 2020-09-04: 09:00:00 200 mg via INTRAVENOUS
  Filled 2020-09-04: qty 10

## 2020-09-04 MED ORDER — ACETAMINOPHEN 325 MG PO TABS
ORAL_TABLET | ORAL | Status: AC
  Filled 2020-09-04: qty 2

## 2020-09-04 MED ORDER — LORATADINE 10 MG PO TABS
ORAL_TABLET | ORAL | Status: AC
  Filled 2020-09-04: qty 1

## 2020-09-04 NOTE — Progress Notes (Signed)
Pt discharged in no apparent distress. Pt left ambulatory without assistance.  Pt aware of discharge instructions and verbalized understanding and had no further questions. Pt stayed 15 minutes of his observation

## 2020-09-04 NOTE — Progress Notes (Signed)
Pharmacist Chemotherapy Monitoring - Initial Assessment    Anticipated start date: 09/11/20   Regimen:  . Are orders appropriate based on the patient's diagnosis, regimen, and cycle? Yes . Does the plan date match the patient's scheduled date? Yes . Is the sequencing of drugs appropriate? Yes . Are the premedications appropriate for the patient's regimen? Yes . Prior Authorization for treatment is: Not Started o If applicable, is the correct biosimilar selected based on the patient's insurance? not applicable  Organ Function and Labs: Marland Kitchen Are dose adjustments needed based on the patient's renal function, hepatic function, or hematologic function? No . Are appropriate labs ordered prior to the start of patient's treatment? Yes . Other organ system assessment, if indicated: N/A . The following baseline labs, if indicated, have been ordered: N/A  Dose Assessment: . Are the drug doses appropriate? Yes . Are the following correct: o Drug concentrations Yes o IV fluid compatible with drug Yes o Administration routes Yes o Timing of therapy Yes . If applicable, does the patient have documented access for treatment and/or plans for port-a-cath placement? yes . If applicable, have lifetime cumulative doses been properly documented and assessed? yes Lifetime Dose Tracking  No doses have been documented on this patient for the following tracked chemicals: Doxorubicin, Epirubicin, Idarubicin, Daunorubicin, Mitoxantrone, Bleomycin, Oxaliplatin, Carboplatin, Liposomal Doxorubicin  o   Toxicity Monitoring/Prevention: . The patient has the following take home antiemetics prescribed: Ondansetron and Prochlorperazine . The patient has the following take home medications prescribed: N/A . Medication allergies and previous infusion related reactions, if applicable, have been reviewed and addressed. Yes . The patient's current medication list has been assessed for drug-drug interactions with their  chemotherapy regimen. no significant drug-drug interactions were identified on review.  Order Review: . Are the treatment plan orders signed? Yes . Is the patient scheduled to see a provider prior to their treatment? No  I verify that I have reviewed each item in the above checklist and answered each question accordingly.   Ebony Hail, Pharm.D., CPP 09/04/2020@2 :06 PM

## 2020-09-04 NOTE — Patient Instructions (Signed)

## 2020-09-05 ENCOUNTER — Inpatient Hospital Stay: Payer: No Typology Code available for payment source

## 2020-09-05 ENCOUNTER — Other Ambulatory Visit: Payer: Self-pay

## 2020-09-05 ENCOUNTER — Telehealth: Payer: Self-pay | Admitting: Dietician

## 2020-09-05 VITALS — BP 135/73 | HR 61 | Temp 97.8°F | Resp 18

## 2020-09-05 DIAGNOSIS — D500 Iron deficiency anemia secondary to blood loss (chronic): Secondary | ICD-10-CM

## 2020-09-05 DIAGNOSIS — C158 Malignant neoplasm of overlapping sites of esophagus: Secondary | ICD-10-CM

## 2020-09-05 DIAGNOSIS — C160 Malignant neoplasm of cardia: Secondary | ICD-10-CM | POA: Diagnosis not present

## 2020-09-05 MED ORDER — ACETAMINOPHEN 325 MG PO TABS
650.0000 mg | ORAL_TABLET | Freq: Once | ORAL | Status: AC
  Administered 2020-09-05: 08:00:00 650 mg via ORAL

## 2020-09-05 MED ORDER — LORATADINE 10 MG PO TABS
10.0000 mg | ORAL_TABLET | Freq: Once | ORAL | Status: AC
  Administered 2020-09-05: 08:00:00 10 mg via ORAL

## 2020-09-05 MED ORDER — SODIUM CHLORIDE 0.9 % IV SOLN
200.0000 mg | Freq: Once | INTRAVENOUS | Status: AC
  Administered 2020-09-05: 08:00:00 200 mg via INTRAVENOUS
  Filled 2020-09-05: qty 200

## 2020-09-05 MED ORDER — ACETAMINOPHEN 325 MG PO TABS
ORAL_TABLET | ORAL | Status: AC
  Filled 2020-09-05: qty 2

## 2020-09-05 MED ORDER — SODIUM CHLORIDE 0.9 % IV SOLN
Freq: Once | INTRAVENOUS | Status: AC
  Filled 2020-09-05: qty 250

## 2020-09-05 MED ORDER — LORATADINE 10 MG PO TABS
ORAL_TABLET | ORAL | Status: AC
  Filled 2020-09-05: qty 1

## 2020-09-05 NOTE — Progress Notes (Signed)
Nutrition  See phone note 

## 2020-09-05 NOTE — Progress Notes (Signed)
Patient declined to stay for 30 minute post observation. VSS upon leaving infusion room.  

## 2020-09-05 NOTE — Patient Instructions (Signed)

## 2020-09-05 NOTE — Telephone Encounter (Signed)
Nutrition Assessment   Reason for Assessment: MD referral   ASSESSMENT: 64 year old male with new Gastroesophageal Junction adenocarcinoma. Patient is followed by Dr. Candise Che and is pending start of chemotherapy with radiation the week of 09/11/2020.  Past medical history of HLD, HTN, PAD, chronic reflux, EtOH use, iron deficiency anemia, GIB with recent hospital admission (12/13) s/p 2 units PRBC and IV iron   Patient scheduled for telephone nutrition appointment at 1:45 PM. RD called patient at 1:46 PM, however no answer. Request for return call with call back number left on voicemail. Received call back at 2:21.  Patient reports he is working at this time. Patient reports decreased intake, but eating better than he anticipated, reports occasionally having to walk around to help with digestion. Wife has been preparing meals and encouraging po intake. He previously drank Ensure supplements, has not been consuming lately. Denied nausea, vomiting, constipation, diarrhea.    Nutrition Focused Physical Exam: deferred   Medications: Wellbutrin, Decadron, Ativan, MVI, Omega 3, Zofran, Miralax, Compazine   Labs: CBG 101 on 12/23   Anthropometrics: He is 8 lbs (3.8%) under usual weight; insignificant.  Height: 6' Weight: 91.9 kg (202 lb 9.6 oz) UBW: 210 lbs  BMI: 27.48    Estimated Energy Needs  Kcals: 2300-2600 Protein: 130-145 Fluid: >2.3 L/day   NUTRITION DIAGNOSIS: Inadequate oral intake related to decreased appetite and cancer of gastroesophageal junction as evidenced by patient report    INTERVENTION:  Discussed strategies for increasing calories and protein Encouraged small frequent meals and snacks Educated on good sources of lean protein Pt agreeable to drinking Ensure Enlive supplement daily Will mail fact sheets  RD contact information provided  MONITORING, EVALUATION, GOAL:  Oral intake Weights   Next Visit: To be scheduled during infusion as needed  Lars Masson, RD, LDN Clinical Nutrition After Hours/Weekend Pager # in Amion

## 2020-09-06 ENCOUNTER — Encounter (HOSPITAL_COMMUNITY): Payer: No Typology Code available for payment source

## 2020-09-06 ENCOUNTER — Ambulatory Visit
Admission: RE | Admit: 2020-09-06 | Discharge: 2020-09-06 | Disposition: A | Discharge status: Home / Self Care | Payer: No Typology Code available for payment source | Source: Ambulatory Visit | Attending: Radiation Oncology | Admitting: Radiation Oncology

## 2020-09-06 ENCOUNTER — Other Ambulatory Visit: Payer: Self-pay | Admitting: Student

## 2020-09-06 ENCOUNTER — Other Ambulatory Visit: Payer: Self-pay

## 2020-09-07 ENCOUNTER — Other Ambulatory Visit: Payer: Self-pay

## 2020-09-07 ENCOUNTER — Ambulatory Visit (HOSPITAL_COMMUNITY)
Admission: RE | Admit: 2020-09-07 | Discharge: 2020-09-07 | Disposition: A | Discharge status: Home / Self Care | Payer: No Typology Code available for payment source | Source: Ambulatory Visit | Attending: Hematology | Admitting: Hematology

## 2020-09-07 ENCOUNTER — Encounter (HOSPITAL_COMMUNITY): Payer: Self-pay

## 2020-09-07 DIAGNOSIS — C158 Malignant neoplasm of overlapping sites of esophagus: Secondary | ICD-10-CM

## 2020-09-07 DIAGNOSIS — Z880 Allergy status to penicillin: Secondary | ICD-10-CM | POA: Insufficient documentation

## 2020-09-07 DIAGNOSIS — Z79899 Other long term (current) drug therapy: Secondary | ICD-10-CM | POA: Diagnosis not present

## 2020-09-07 HISTORY — PX: IR IMAGING GUIDED PORT INSERTION: IMG5740

## 2020-09-07 LAB — CBC
HCT: 31.1 % — ABNORMAL LOW (ref 39.0–52.0)
Hemoglobin: 9.6 g/dL — ABNORMAL LOW (ref 13.0–17.0)
MCH: 26.2 pg (ref 26.0–34.0)
MCHC: 30.9 g/dL (ref 30.0–36.0)
MCV: 84.7 fL (ref 80.0–100.0)
Platelets: 300 10*3/uL (ref 150–400)
RBC: 3.67 MIL/uL — ABNORMAL LOW (ref 4.22–5.81)
RDW: 18.6 % — ABNORMAL HIGH (ref 11.5–15.5)
WBC: 4.2 10*3/uL (ref 4.0–10.5)
nRBC: 0 % (ref 0.0–0.2)

## 2020-09-07 LAB — PROTIME-INR
INR: 1 (ref 0.8–1.2)
Prothrombin Time: 12.8 seconds (ref 11.4–15.2)

## 2020-09-07 MED ORDER — CLINDAMYCIN PHOSPHATE 900 MG/50ML IV SOLN: 900.0000 mg | INTRAVENOUS | Status: AC

## 2020-09-07 MED ORDER — SODIUM CHLORIDE 0.9 % IV SOLN: INTRAVENOUS | Status: DC

## 2020-09-07 MED ORDER — MIDAZOLAM HCL 2 MG/2ML IJ SOLN
INTRAMUSCULAR | Status: AC
  Filled 2020-09-07: qty 4

## 2020-09-07 MED ORDER — LIDOCAINE HCL (PF) 1 % IJ SOLN
INTRAMUSCULAR | Status: AC | PRN
  Administered 2020-09-07 (×2): 10 mL via INTRADERMAL

## 2020-09-07 MED ORDER — FENTANYL CITRATE (PF) 100 MCG/2ML IJ SOLN
INTRAMUSCULAR | Status: AC | PRN
  Administered 2020-09-07 (×2): 50 ug via INTRAVENOUS

## 2020-09-07 MED ORDER — HEPARIN SOD (PORK) LOCK FLUSH 100 UNIT/ML IV SOLN
INTRAVENOUS | Status: AC | PRN
  Administered 2020-09-07: 500 [IU] via INTRAVENOUS

## 2020-09-07 MED ORDER — MIDAZOLAM HCL 2 MG/2ML IJ SOLN
INTRAMUSCULAR | Status: AC | PRN
Start: 2020-09-07 — End: 2020-09-07
  Administered 2020-09-07: 0.5 mg via INTRAVENOUS
  Administered 2020-09-07 (×2): 1 mg via INTRAVENOUS

## 2020-09-07 MED ORDER — CLINDAMYCIN PHOSPHATE 900 MG/50ML IV SOLN
INTRAVENOUS | Status: AC
  Administered 2020-09-07: 09:00:00 900 mg via INTRAVENOUS
  Filled 2020-09-07: qty 50

## 2020-09-07 MED ORDER — HEPARIN SOD (PORK) LOCK FLUSH 100 UNIT/ML IV SOLN
INTRAVENOUS | Status: AC
  Filled 2020-09-07: qty 5

## 2020-09-07 MED ORDER — FENTANYL CITRATE (PF) 100 MCG/2ML IJ SOLN
INTRAMUSCULAR | Status: AC
  Filled 2020-09-07: qty 2

## 2020-09-07 MED ORDER — LIDOCAINE HCL 1 % IJ SOLN
INTRAMUSCULAR | Status: AC
  Filled 2020-09-07: qty 20

## 2020-09-07 NOTE — H&P (Signed)
Chief Complaint: Patient was seen in consultation today for port-a-catheter placement.   Referring Physician(s): Johney Maine  Supervising Physician: Irish Lack  Patient Status: [facility] - Out-pt  History of Present Illness: Donald Wade is a 64 y.o. male with a medical history significant for HTN, HLD and peripheral vascular disease. Lab work up for a vascular procedure in mid-December 2021 revealed a hemoglobin of 6 and he was sent to the ED for further evaluation. During that visit the patient stated he'd had reflux, black tarry stools and some weight loss for several months. He was taking Plavix and he attributed his symptoms to this medication. He was admitted and a small protuberant mass at the GE junction was observed on endoscopy. A biopsy was taken with pathology positive for poorly differentiated adenocarcinoma. His Oncology team is preparing him for chemotherapy.    Interventional Radiology has been asked to evaluate this patient for an image-guided port-a-catheter placement to facilitate his treatment plans.   Past Medical History:  Diagnosis Date  . Anemia 08/22/2020  . Heart murmur   . Hyperlipidemia   . Hypertension     Past Surgical History:  Procedure Laterality Date  . APPENDECTOMY  1980  . BIOPSY  08/22/2020   Procedure: BIOPSY;  Surgeon: Vida Rigger, MD;  Location: [facility] ENDOSCOPY;  Service: Endoscopy;;  . ESOPHAGOGASTRODUODENOSCOPY  08/22/2020  . ESOPHAGOGASTRODUODENOSCOPY N/A 08/22/2020   Procedure: ESOPHAGOGASTRODUODENOSCOPY (EGD);  Surgeon: Vida Rigger, MD;  Location: [facility] ENDOSCOPY;  Service: Endoscopy;  Laterality: N/A;  . LEFT HEART CATH AND CORONARY ANGIOGRAPHY N/A 11/23/2019   Procedure: LEFT HEART CATH AND CORONARY ANGIOGRAPHY;  Surgeon: Elder Negus, MD;  Location: [facility] INVASIVE CV LAB;  Service: Cardiovascular;  Laterality: N/A;    Allergies: Bee venom and Penicillin g  Medications: Prior to Admission medications    Medication Sig Start Date End Date Taking? Authorizing Provider  acetaminophen (TYLENOL) 325 MG tablet Take 650 mg by mouth every 6 (six) hours as needed for mild pain.    [provider]  amLODipine (NORVASC) 5 MG tablet Take 1 tablet (5 mg total) by mouth daily. 08/24/20   Regalado, Belkys A, MD  atorvastatin (LIPITOR) 40 MG tablet Take 1 tablet (40 mg total) by mouth daily. 12/03/19   Patwardhan, Anabel Bene, MD  buPROPion (WELLBUTRIN XL) 150 MG 24 hr tablet Take 150 mg by mouth daily.  12/17/16   [provider]  dexamethasone (DECADRON) 4 MG tablet Take 2 tablets (8 mg total) by mouth daily. Start the day after chemotherapy for 2 days. 08/28/20   Johney Maine, MD  EPINEPHrine 0.3 mg/0.3 mL IJ SOAJ injection Inject 0.3 mg into the skin as needed for anaphylaxis (call 911). 05/01/16   [provider]  feeding supplement (ENSURE ENLIVE / ENSURE PLUS) LIQD Take 237 mLs by mouth 2 (two) times daily between meals. 08/23/20   Regalado, Belkys A, MD  fexofenadine (ALLEGRA) 180 MG tablet Take 180 mg by mouth daily as needed for allergies. 11/30/09   [provider]  lidocaine-prilocaine (EMLA) cream Apply to affected area once 08/28/20   Johney Maine, MD  LORazepam (ATIVAN) 0.5 MG tablet Take 1 tablet (0.5 mg total) by mouth every 6 (six) hours as needed (Nausea or vomiting). 08/28/20   Johney Maine, MD  metoprolol succinate (TOPROL XL) 50 MG 24 hr tablet Take 1 tablet (50 mg total) by mouth daily. Patient taking differently: Take 25 mg by mouth daily. 03/06/20   Patwardhan, Manish J,  MD  Multiple Vitamin (MULTI-VITAMIN) tablet Take 1 tablet by mouth daily.    [provider]  Omega-3 1000 MG CAPS Take 1,000 mg by mouth once a week.    [provider]  ondansetron (ZOFRAN) 8 MG tablet Take 1 tablet (8 mg total) by mouth 2 (two) times daily as needed for refractory nausea / vomiting. Start on day 3 after chemo. 08/28/20   Brunetta Genera, MD  pantoprazole (PROTONIX) 40 MG tablet Take 1 tablet (40 mg total) by mouth 2 (two) times daily. 08/23/20   Regalado, Belkys A, MD  polyethylene glycol (MIRALAX / GLYCOLAX) 17 g packet Take 17 g by mouth daily as needed for mild constipation. 08/23/20   Regalado, Belkys A, MD  prochlorperazine (COMPAZINE) 10 MG tablet Take 1 tablet (10 mg total) by mouth every 6 (six) hours as needed (Nausea or vomiting). 08/28/20   Brunetta Genera, MD  vitamin B-12 (CYANOCOBALAMIN) 1000 MCG tablet Take 1,000 mcg by mouth daily.    [provider]     Family History  Problem Relation Age of Onset  . Heart disease Mother   . Atrial fibrillation Mother     Social History   Socioeconomic History  . Marital status: Married    Spouse name: Not on file  . Number of children: 2  . Years of education: Not on file  . Highest education level: Not on file  Occupational History  . Not on file  Tobacco Use  . Smoking status: Never Smoker  . Smokeless tobacco: Never Used  Vaping Use  . Vaping Use: Never used  Substance and Sexual Activity  . Alcohol use: Yes    Comment: 2 beers daily  . Drug use: Never  . Sexual activity: Not on file  Other Topics Concern  . Not on file  Social History Narrative  . Not on file   Social Determinants of Health   Financial Resource Strain: Not on file  Food Insecurity: Not on file  Transportation Needs: Not on file  Physical Activity: Not on file  Stress: Not on file  Social Connections: Not on file    Review of Systems: A 12 point ROS discussed and pertinent positives are indicated in the HPI above.  All other systems are negative.  Review of Systems  Constitutional: Negative for appetite change and fatigue.  Respiratory: Negative for cough and shortness of breath.   Cardiovascular: Negative for chest pain and leg swelling.  Gastrointestinal: Negative for abdominal pain, diarrhea, nausea and vomiting.  Musculoskeletal: Negative for gait  problem.  Neurological: Negative for dizziness and headaches.    Vital Signs: BP (!) 162/81 (BP Location: Right Arm)   Pulse 67   Temp 98 F (36.7 C) (Oral)   Resp 17   SpO2 100%   Physical Exam Constitutional:      General: He is not in acute distress. HENT:     Mouth/Throat:     Mouth: Mucous membranes are moist.     Pharynx: Oropharynx is clear.  Cardiovascular:     Rate and Rhythm: Normal rate and regular rhythm.  Pulmonary:     Effort: Pulmonary effort is normal.     Breath sounds: Normal breath sounds.  Abdominal:     General: Bowel sounds are normal.     Palpations: Abdomen is soft.     Tenderness: There is no abdominal tenderness.  Musculoskeletal:        General: Normal range of motion.  Cervical back: Normal range of motion.  Skin:    General: Skin is warm and dry.  Neurological:     Mental Status: He is alert and oriented to person, place, and time.  Psychiatric:        Mood and Affect: Mood normal.        Behavior: Behavior normal.        Thought Content: Thought content normal.        Judgment: Judgment normal.     Imaging: CT CHEST W CONTRAST  Result Date: 08/22/2020 CLINICAL DATA:  64 year old male with GI cancer staging. EXAM: CT CHEST, ABDOMEN, AND PELVIS WITH CONTRAST TECHNIQUE: Multidetector CT imaging of the chest, abdomen and pelvis was performed following the standard protocol during bolus administration of intravenous contrast. CONTRAST:  172mL OMNIPAQUE IOHEXOL 300 MG/ML  SOLN COMPARISON:  CT abdomen pelvis dated 01/03/2004. FINDINGS: CT CHEST FINDINGS Cardiovascular: There is no cardiomegaly or pericardial effusion. There is coronary vascular calcification. Mild atherosclerotic calcification of the thoracic aorta. No aneurysmal dilatation or dissection. The origins of the great vessels of the aortic arch appear patent as visualized. The central pulmonary arteries appear patent. Mediastinum/Nodes: No hilar or mediastinal adenopathy. Top-normal  right hilar lymph node measures 9 mm. The esophagus and the thyroid gland are grossly unremarkable. No mediastinal fluid collection. Lungs/Pleura: Minimal left lung base dependent atelectasis. No focal consolidation, pleural effusion, pneumothorax. The central airways are patent. Musculoskeletal: Old healed left posterior rib fractures. No acute osseous pathology. CT ABDOMEN PELVIS FINDINGS No intra-abdominal free air or free fluid. Hepatobiliary: No focal liver abnormality is seen. No gallstones, gallbladder wall thickening, or biliary dilatation. Pancreas: Unremarkable. No pancreatic ductal dilatation or surrounding inflammatory changes. Spleen: Normal in size without focal abnormality. Adrenals/Urinary Tract: The adrenal glands unremarkable. There is no hydronephrosis on either side. There is symmetric enhancement and excretion of contrast by both kidneys. The visualized ureters and urinary bladder appear unremarkable. Stomach/Bowel: There is sigmoid diverticulosis and scattered colonic diverticula without active inflammatory changes. There is thickened appearance of the wall of the stomach adjacent to the gastroesophageal junction as well as thickening of the proximal gastric wall. No evidence of gastric outlet obstruction. There is no bowel obstruction or active inflammation. Appendectomy. Vascular/Lymphatic: Advanced aortoiliac atherosclerotic disease. The IVC is unremarkable. No portal venous gas. Several small but mildly rounded gastrohepatic lymph nodes concerning for early nodal metastatic disease. Several additional mildly enlarged retroperitoneal lymph nodes along the abdominal aorta and IVC noted. A mildly enlarged lymph node anterior to the aorta measures 10 mm in short axis (91/3). Reproductive: The prostate and seminal vesicles are grossly unremarkable. Other: None Musculoskeletal: Degenerative changes of the spine. No acute osseous pathology. IMPRESSION: 1. Thickened appearance of the wall of the  stomach adjacent to the gastroesophageal junction as well as proximal gastric wall concerning for infiltrative neoplasm. 2. Rounded gastrohepatic lymph nodes as well as mildly enlarged retroperitoneal lymph nodes concerning for early nodal metastatic disease. 3. No evidence of metastatic disease in the chest. 4. Colonic diverticulosis. 5. Aortic Atherosclerosis (ICD10-I70.0). Electronically Signed   By: Anner Crete M.D.   On: 08/22/2020 20:03   CT ABDOMEN PELVIS W CONTRAST  Result Date: 08/22/2020 CLINICAL DATA:  64 year old male with GI cancer staging. EXAM: CT CHEST, ABDOMEN, AND PELVIS WITH CONTRAST TECHNIQUE: Multidetector CT imaging of the chest, abdomen and pelvis was performed following the standard protocol during bolus administration of intravenous contrast. CONTRAST:  134mL OMNIPAQUE IOHEXOL 300 MG/ML  SOLN COMPARISON:  CT abdomen pelvis dated  01/03/2004. FINDINGS: CT CHEST FINDINGS Cardiovascular: There is no cardiomegaly or pericardial effusion. There is coronary vascular calcification. Mild atherosclerotic calcification of the thoracic aorta. No aneurysmal dilatation or dissection. The origins of the great vessels of the aortic arch appear patent as visualized. The central pulmonary arteries appear patent. Mediastinum/Nodes: No hilar or mediastinal adenopathy. Top-normal right hilar lymph node measures 9 mm. The esophagus and the thyroid gland are grossly unremarkable. No mediastinal fluid collection. Lungs/Pleura: Minimal left lung base dependent atelectasis. No focal consolidation, pleural effusion, pneumothorax. The central airways are patent. Musculoskeletal: Old healed left posterior rib fractures. No acute osseous pathology. CT ABDOMEN PELVIS FINDINGS No intra-abdominal free air or free fluid. Hepatobiliary: No focal liver abnormality is seen. No gallstones, gallbladder wall thickening, or biliary dilatation. Pancreas: Unremarkable. No pancreatic ductal dilatation or surrounding  inflammatory changes. Spleen: Normal in size without focal abnormality. Adrenals/Urinary Tract: The adrenal glands unremarkable. There is no hydronephrosis on either side. There is symmetric enhancement and excretion of contrast by both kidneys. The visualized ureters and urinary bladder appear unremarkable. Stomach/Bowel: There is sigmoid diverticulosis and scattered colonic diverticula without active inflammatory changes. There is thickened appearance of the wall of the stomach adjacent to the gastroesophageal junction as well as thickening of the proximal gastric wall. No evidence of gastric outlet obstruction. There is no bowel obstruction or active inflammation. Appendectomy. Vascular/Lymphatic: Advanced aortoiliac atherosclerotic disease. The IVC is unremarkable. No portal venous gas. Several small but mildly rounded gastrohepatic lymph nodes concerning for early nodal metastatic disease. Several additional mildly enlarged retroperitoneal lymph nodes along the abdominal aorta and IVC noted. A mildly enlarged lymph node anterior to the aorta measures 10 mm in short axis (91/3). Reproductive: The prostate and seminal vesicles are grossly unremarkable. Other: None Musculoskeletal: Degenerative changes of the spine. No acute osseous pathology. IMPRESSION: 1. Thickened appearance of the wall of the stomach adjacent to the gastroesophageal junction as well as proximal gastric wall concerning for infiltrative neoplasm. 2. Rounded gastrohepatic lymph nodes as well as mildly enlarged retroperitoneal lymph nodes concerning for early nodal metastatic disease. 3. No evidence of metastatic disease in the chest. 4. Colonic diverticulosis. 5. Aortic Atherosclerosis (ICD10-I70.0). Electronically Signed   By: Anner Crete M.D.   On: 08/22/2020 20:03   NM PET Image Initial (PI) Skull Base To Thigh  Result Date: 08/31/2020 CLINICAL DATA:  Initial treatment strategy for newly diagnosed GE junction/esophageal cancer.  EXAM: NUCLEAR MEDICINE PET SKULL BASE TO THIGH TECHNIQUE: 10.1 mCi F-18 FDG was injected intravenously. Full-ring PET imaging was performed from the skull base to thigh after the radiotracer. CT data was obtained and used for attenuation correction and anatomic localization. Fasting blood glucose: 101 mg/dl COMPARISON:  CT chest abdomen pelvis dated 08/22/2020 FINDINGS: Mediastinal blood pool activity: SUV max 2.5 Liver activity: SUV max NA NECK: No hypermetabolic cervical lymphadenopathy. Incidental CT findings: none CHEST: No hypermetabolic thoracic lymphadenopathy. No suspicious pulmonary nodules. Incidental CT findings: Atherosclerotic calcifications of the aortic arch. Mild coronary atherosclerosis the LAD and right coronary artery. ABDOMEN/PELVIS: Wall thickening with hypermetabolism centered in the gastric cardia, max SUV 8.5, compatible with the patient's known neoplasm. This does involve the distal esophagus just above the GE junction (series 4/image 104) but predominantly involves the proximal stomach. Two prominent gastrohepatic nodes measuring up to 9 mm short axis (series 4/image 121), max SUV 2.5. While hypermetabolism is relatively low, the location is certainly concerning. Scattered retroperitoneal/para-aortic nodes measuring up to 10 mm short axis, at the upper limits of normal  for size but certainly increased in number, max SUV 3.8. This location is less commonly seen for nodal spread, but this finding is worrisome for nodal metastases until proven otherwise. No abnormal hypermetabolism in the liver, spleen, pancreas, or adrenal glands. Incidental CT findings: Extensive sigmoid diverticulosis, without evidence of diverticulitis. Thick-walled bladder, although underdistended. Atherosclerotic calcifications the abdominal aorta and branch vessels. SKELETON: No focal hypermetabolic activity to suggest skeletal metastasis. Incidental CT findings: Degenerative changes of the visualized thoracolumbar  spine. IMPRESSION: Wall thickening/mass centered in the gastric cardia, compatible with the patient's known primary neoplasm. This predominantly involves the proximal stomach rather than the distal esophagus. Small gastrohepatic and para-aortic nodes, suspicious for nodal metastases. Electronically Signed   By: Julian Hy M.D.   On: 08/31/2020 10:23    Labs:  CBC: Recent Labs    08/21/20 1402 08/22/20 0139 08/23/20 0830 09/07/20 0844  WBC 3.6* 3.7* 3.9* 4.2  HGB 5.8* 7.4* 7.6* 9.6*  HCT 19.5* 24.0* 25.1* 31.1*  PLT 376 298 318 300    COAGS: Recent Labs    08/22/20 0139 08/22/20 0334 09/07/20 0844  INR 1.0 1.0 1.0  APTT 33  --   --     BMP: Recent Labs    11/23/19 1302 05/31/20 1101 08/21/20 1402 08/22/20 0139 08/23/20 0403  NA 134* 134 135 133* 135  K 3.7 4.4 4.0 3.6 3.7  CL 96* 96 101 102 102  CO2 24 24 25 23 23   GLUCOSE 106* 101* 124* 114* 101*  BUN 10 10 11 9 8   CALCIUM 9.5 9.2 8.9 8.5* 8.4*  CREATININE 0.87 0.81 0.92 0.77 0.80  GFRNONAA >60 95 >60 >60 >60  GFRAA >60 110  --   --   --     LIVER FUNCTION TESTS: Recent Labs    08/21/20 1402 08/22/20 0139 08/23/20 0403  BILITOT 0.6 1.0 1.1  AST 24 23 22   ALT 21 20 21   ALKPHOS 79 72 69  PROT 6.4* 5.9* 5.8*  ALBUMIN 3.4* 3.2* 3.2*    TUMOR MARKERS: No results for input(s): AFPTM, CEA, CA199, CHROMGRNA in the last 8760 hours.  Assessment and Plan:  Gastroesophageal junction adenocarcinoma: Donald Wade. Donald Wade, 64 year old male, presents today to the [facility] department for an image-guided port-a-catheter placement.  Risks and benefits of image-guided port-a-catheter placement were discussed with the patient including, but not limited to bleeding, infection, pneumothorax, or fibrin sheath development and need for additional procedures.  All of the patient's questions were answered, patient is agreeable to proceed.  The patient has been NPO. Labs and vitals have  been reviewed. He does not take any blood-thinning medications.   Consent signed and in chart.  Thank you for this interesting consult.  I greatly enjoyed meeting Donald Wade and look forward to participating in their care.  A copy of this report was sent to the requesting provider on this date.  Electronically Signed: Soyla Dryer, AGACNP-BC [phone] 09/07/2020, 9:32 AM   I spent a total of  30 Minutes   in face to face in clinical consultation, greater than 50% of which was counseling/coordinating care for port-a-catheter placement.

## 2020-09-07 NOTE — Procedures (Signed)
Interventional Radiology Procedure Note  Procedure: Single Lumen Power Port Placement    Access:  Right IJ vein.  Findings: Catheter tip positioned at SVC/RA junction. Port is ready for immediate use.   Complications: None  EBL: < 10 mL  Recommendations:  - Ok to shower in 24 hours - Do not submerge for 7 days - Routine line care   Anastacio Bua T. Lakeesha Fontanilla, M.D Pager:  [phone]   

## 2020-09-07 NOTE — Discharge Instructions (Signed)

## 2020-09-09 DIAGNOSIS — I10 Essential (primary) hypertension: Secondary | ICD-10-CM | POA: Diagnosis not present

## 2020-09-09 DIAGNOSIS — C160 Malignant neoplasm of cardia: Secondary | ICD-10-CM | POA: Diagnosis present

## 2020-09-09 DIAGNOSIS — Z7901 Long term (current) use of anticoagulants: Secondary | ICD-10-CM | POA: Diagnosis not present

## 2020-09-09 DIAGNOSIS — C158 Malignant neoplasm of overlapping sites of esophagus: Secondary | ICD-10-CM | POA: Insufficient documentation

## 2020-09-09 DIAGNOSIS — K921 Melena: Secondary | ICD-10-CM | POA: Diagnosis not present

## 2020-09-09 DIAGNOSIS — D500 Iron deficiency anemia secondary to blood loss (chronic): Secondary | ICD-10-CM | POA: Diagnosis not present

## 2020-09-09 DIAGNOSIS — I739 Peripheral vascular disease, unspecified: Secondary | ICD-10-CM | POA: Diagnosis not present

## 2020-09-09 DIAGNOSIS — Z79899 Other long term (current) drug therapy: Secondary | ICD-10-CM | POA: Diagnosis not present

## 2020-09-09 DIAGNOSIS — R011 Cardiac murmur, unspecified: Secondary | ICD-10-CM | POA: Diagnosis not present

## 2020-09-09 DIAGNOSIS — Z5111 Encounter for antineoplastic chemotherapy: Secondary | ICD-10-CM | POA: Diagnosis not present

## 2020-09-09 DIAGNOSIS — Z510 Encounter for antineoplastic radiation therapy: Secondary | ICD-10-CM | POA: Insufficient documentation

## 2020-09-09 DIAGNOSIS — K219 Gastro-esophageal reflux disease without esophagitis: Secondary | ICD-10-CM | POA: Diagnosis not present

## 2020-09-09 DIAGNOSIS — I700 Atherosclerosis of aorta: Secondary | ICD-10-CM | POA: Diagnosis not present

## 2020-09-09 DIAGNOSIS — E785 Hyperlipidemia, unspecified: Secondary | ICD-10-CM | POA: Diagnosis not present

## 2020-09-11 ENCOUNTER — Ambulatory Visit
Admission: RE | Admit: 2020-09-11 | Discharge: 2020-09-11 | Disposition: A | Discharge status: Home / Self Care | Payer: No Typology Code available for payment source | Source: Ambulatory Visit | Attending: Radiation Oncology | Admitting: Radiation Oncology

## 2020-09-11 ENCOUNTER — Inpatient Hospital Stay: Payer: No Typology Code available for payment source

## 2020-09-11 ENCOUNTER — Other Ambulatory Visit: Payer: Self-pay

## 2020-09-11 ENCOUNTER — Ambulatory Visit: Payer: No Typology Code available for payment source | Admitting: Radiation Oncology

## 2020-09-11 ENCOUNTER — Encounter: Payer: Self-pay | Admitting: Hematology

## 2020-09-11 ENCOUNTER — Inpatient Hospital Stay: Payer: No Typology Code available for payment source | Attending: Hematology

## 2020-09-11 VITALS — BP 126/72 | HR 64 | Temp 98.1°F | Resp 18

## 2020-09-11 DIAGNOSIS — Z7901 Long term (current) use of anticoagulants: Secondary | ICD-10-CM | POA: Insufficient documentation

## 2020-09-11 DIAGNOSIS — I10 Essential (primary) hypertension: Secondary | ICD-10-CM | POA: Insufficient documentation

## 2020-09-11 DIAGNOSIS — R011 Cardiac murmur, unspecified: Secondary | ICD-10-CM | POA: Insufficient documentation

## 2020-09-11 DIAGNOSIS — C158 Malignant neoplasm of overlapping sites of esophagus: Secondary | ICD-10-CM

## 2020-09-11 DIAGNOSIS — Z510 Encounter for antineoplastic radiation therapy: Secondary | ICD-10-CM | POA: Insufficient documentation

## 2020-09-11 DIAGNOSIS — E785 Hyperlipidemia, unspecified: Secondary | ICD-10-CM | POA: Insufficient documentation

## 2020-09-11 DIAGNOSIS — D500 Iron deficiency anemia secondary to blood loss (chronic): Secondary | ICD-10-CM | POA: Insufficient documentation

## 2020-09-11 DIAGNOSIS — I700 Atherosclerosis of aorta: Secondary | ICD-10-CM | POA: Insufficient documentation

## 2020-09-11 DIAGNOSIS — K219 Gastro-esophageal reflux disease without esophagitis: Secondary | ICD-10-CM | POA: Insufficient documentation

## 2020-09-11 DIAGNOSIS — Z79899 Other long term (current) drug therapy: Secondary | ICD-10-CM | POA: Insufficient documentation

## 2020-09-11 DIAGNOSIS — K921 Melena: Secondary | ICD-10-CM | POA: Insufficient documentation

## 2020-09-11 DIAGNOSIS — Z5111 Encounter for antineoplastic chemotherapy: Secondary | ICD-10-CM | POA: Insufficient documentation

## 2020-09-11 DIAGNOSIS — K922 Gastrointestinal hemorrhage, unspecified: Secondary | ICD-10-CM

## 2020-09-11 DIAGNOSIS — C160 Malignant neoplasm of cardia: Secondary | ICD-10-CM | POA: Insufficient documentation

## 2020-09-11 DIAGNOSIS — I739 Peripheral vascular disease, unspecified: Secondary | ICD-10-CM | POA: Insufficient documentation

## 2020-09-11 LAB — CBC WITH DIFFERENTIAL (CANCER CENTER ONLY)
Abs Immature Granulocytes: 0.01 10*3/uL (ref 0.00–0.07)
Basophils Absolute: 0.1 10*3/uL (ref 0.0–0.1)
Basophils Relative: 1 %
Eosinophils Absolute: 0.1 10*3/uL (ref 0.0–0.5)
Eosinophils Relative: 2 %
HCT: 32.8 % — ABNORMAL LOW (ref 39.0–52.0)
Hemoglobin: 10.4 g/dL — ABNORMAL LOW (ref 13.0–17.0)
Immature Granulocytes: 0 %
Lymphocytes Relative: 19 %
Lymphs Abs: 0.8 10*3/uL (ref 0.7–4.0)
MCH: 26.7 pg (ref 26.0–34.0)
MCHC: 31.7 g/dL (ref 30.0–36.0)
MCV: 84.1 fL (ref 80.0–100.0)
Monocytes Absolute: 0.6 10*3/uL (ref 0.1–1.0)
Monocytes Relative: 13 %
Neutro Abs: 2.7 10*3/uL (ref 1.7–7.7)
Neutrophils Relative %: 65 %
Platelet Count: 277 10*3/uL (ref 150–400)
RBC: 3.9 MIL/uL — ABNORMAL LOW (ref 4.22–5.81)
RDW: 19 % — ABNORMAL HIGH (ref 11.5–15.5)
WBC Count: 4.1 10*3/uL (ref 4.0–10.5)
nRBC: 0 % (ref 0.0–0.2)

## 2020-09-11 LAB — CMP (CANCER CENTER ONLY)
ALT: 20 U/L (ref 0–44)
AST: 23 U/L (ref 15–41)
Albumin: 3.9 g/dL (ref 3.5–5.0)
Alkaline Phosphatase: 105 U/L (ref 38–126)
Anion gap: 7 (ref 5–15)
BUN: 8 mg/dL (ref 8–23)
CO2: 27 mmol/L (ref 22–32)
Calcium: 9 mg/dL (ref 8.9–10.3)
Chloride: 102 mmol/L (ref 98–111)
Creatinine: 0.77 mg/dL (ref 0.61–1.24)
GFR, Estimated: >60 mL/min (ref >60)
Glucose, Bld: 99 mg/dL (ref 70–99)
Potassium: 4.2 mmol/L (ref 3.5–5.1)
Sodium: 136 mmol/L (ref 135–145)
Total Bilirubin: 0.9 mg/dL (ref 0.3–1.2)
Total Protein: 7.1 g/dL (ref 6.5–8.1)

## 2020-09-11 MED ORDER — FAMOTIDINE IN NACL 20-0.9 MG/50ML-% IV SOLN
20.0000 mg | Freq: Once | INTRAVENOUS | Status: AC
  Administered 2020-09-11: 20 mg via INTRAVENOUS

## 2020-09-11 MED ORDER — SODIUM CHLORIDE 0.9 % IV SOLN
50.0000 mg/m2 | Freq: Once | INTRAVENOUS | Status: AC
  Administered 2020-09-11: 108 mg via INTRAVENOUS
  Filled 2020-09-11: qty 18

## 2020-09-11 MED ORDER — SODIUM CHLORIDE 0.9 % IV SOLN
200.0000 mg | Freq: Once | INTRAVENOUS | Status: AC
  Administered 2020-09-11: 200 mg via INTRAVENOUS
  Filled 2020-09-11: qty 200

## 2020-09-11 MED ORDER — FAMOTIDINE IN NACL 20-0.9 MG/50ML-% IV SOLN
INTRAVENOUS | Status: AC
  Filled 2020-09-11: qty 50

## 2020-09-11 MED ORDER — PALONOSETRON HCL INJECTION 0.25 MG/5ML
INTRAVENOUS | Status: AC
  Filled 2020-09-11: qty 5

## 2020-09-11 MED ORDER — LORATADINE 10 MG PO TABS
ORAL_TABLET | ORAL | Status: AC
  Filled 2020-09-11: qty 1

## 2020-09-11 MED ORDER — ACETAMINOPHEN 325 MG PO TABS
ORAL_TABLET | ORAL | Status: AC
  Filled 2020-09-11: qty 2

## 2020-09-11 MED ORDER — HEPARIN SOD (PORK) LOCK FLUSH 100 UNIT/ML IV SOLN
500.0000 [IU] | Freq: Once | INTRAVENOUS | Status: AC | PRN
  Administered 2020-09-11: 500 [IU]
  Filled 2020-09-11: qty 5

## 2020-09-11 MED ORDER — PALONOSETRON HCL INJECTION 0.25 MG/5ML
0.2500 mg | Freq: Once | INTRAVENOUS | Status: AC
  Administered 2020-09-11: 0.25 mg via INTRAVENOUS

## 2020-09-11 MED ORDER — LORATADINE 10 MG PO TABS: 10.0000 mg | ORAL_TABLET | Freq: Once | ORAL | Status: DC

## 2020-09-11 MED ORDER — DIPHENHYDRAMINE HCL 50 MG/ML IJ SOLN
INTRAMUSCULAR | Status: AC
  Filled 2020-09-11: qty 1

## 2020-09-11 MED ORDER — SODIUM CHLORIDE 0.9% FLUSH
10.0000 mL | INTRAVENOUS | Status: DC | PRN
  Administered 2020-09-11: 10 mL
  Filled 2020-09-11: qty 10

## 2020-09-11 MED ORDER — SODIUM CHLORIDE 0.9% FLUSH
10.0000 mL | Freq: Once | INTRAVENOUS | Status: AC | PRN
  Administered 2020-09-11: 10 mL
  Filled 2020-09-11: qty 10

## 2020-09-11 MED ORDER — SODIUM CHLORIDE 0.9 % IV SOLN
298.8000 mg | Freq: Once | INTRAVENOUS | Status: AC
  Administered 2020-09-11: 300 mg via INTRAVENOUS
  Filled 2020-09-11: qty 30

## 2020-09-11 MED ORDER — SODIUM CHLORIDE 0.9 % IV SOLN
Freq: Once | INTRAVENOUS | Status: AC
  Filled 2020-09-11: qty 250

## 2020-09-11 MED ORDER — DIPHENHYDRAMINE HCL 50 MG/ML IJ SOLN
50.0000 mg | Freq: Once | INTRAMUSCULAR | Status: AC
  Administered 2020-09-11: 50 mg via INTRAVENOUS

## 2020-09-11 MED ORDER — ACETAMINOPHEN 325 MG PO TABS
650.0000 mg | ORAL_TABLET | Freq: Once | ORAL | Status: AC
  Administered 2020-09-11: 650 mg via ORAL

## 2020-09-11 MED ORDER — SODIUM CHLORIDE 0.9 % IV SOLN
20.0000 mg | Freq: Once | INTRAVENOUS | Status: AC
  Administered 2020-09-11: 20 mg via INTRAVENOUS
  Filled 2020-09-11: qty 20
  Filled 2020-09-11: qty 2

## 2020-09-11 NOTE — Patient Instructions (Signed)
[facility] Discharge Instructions for Patients Receiving Chemotherapy  Today you received the following chemotherapy agents Paclitaxel (Taxol) and Carboplatin (Paraplatin).  To help prevent nausea and vomiting after your treatment, we encourage you to take your nausea medication as directed.   If you develop nausea and vomiting that is not controlled by your nausea medication, call the clinic.   BELOW ARE SYMPTOMS THAT SHOULD BE REPORTED IMMEDIATELY:  *FEVER GREATER THAN 100.5 F  *CHILLS WITH OR WITHOUT FEVER  NAUSEA AND VOMITING THAT IS NOT CONTROLLED WITH YOUR NAUSEA MEDICATION  *UNUSUAL SHORTNESS OF BREATH  *UNUSUAL BRUISING OR BLEEDING  TENDERNESS IN MOUTH AND THROAT WITH OR WITHOUT PRESENCE OF ULCERS  *URINARY PROBLEMS  *BOWEL PROBLEMS  UNUSUAL RASH Items with * indicate a potential emergency and should be followed up as soon as possible.  Feel free to call the clinic should you have any questions or concerns. The clinic phone number is [phone].  Please show the [facility] at check-in to the Emergency Department and triage nurse.  Paclitaxel injection What is this medicine? PACLITAXEL (PAK li TAX el) is a chemotherapy drug. It targets fast dividing cells, like cancer cells, and causes these cells to die. This medicine is used to treat ovarian cancer, breast cancer, lung cancer, Kaposi's sarcoma, and other cancers. This medicine may be used for other purposes; ask your health care provider or pharmacist if you have questions. COMMON BRAND NAME(S): Onxol, Taxol What should I tell my health care provider before I take this medicine? They need to know if you have any of these conditions:  history of irregular heartbeat  liver disease  low blood counts, like low white cell, platelet, or red cell counts  lung or breathing disease, like asthma  tingling of the fingers or toes, or other nerve disorder  an unusual or allergic reaction to  paclitaxel, alcohol, polyoxyethylated castor oil, other chemotherapy, other medicines, foods, dyes, or preservatives  pregnant or trying to get pregnant  breast-feeding How should I use this medicine? This drug is given as an infusion into a vein. It is administered in a hospital or clinic by a specially trained health care professional. Talk to your pediatrician regarding the use of this medicine in children. Special care may be needed. Overdosage: If you think you have taken too much of this medicine contact a poison control center or emergency room at once. NOTE: This medicine is only for you. Do not share this medicine with others. What if I miss a dose? It is important not to miss your dose. Call your doctor or health care professional if you are unable to keep an appointment. What may interact with this medicine? Do not take this medicine with any of the following medications:  disulfiram  metronidazole This medicine may also interact with the following medications:  antiviral medicines for hepatitis, HIV or AIDS  certain antibiotics like erythromycin and clarithromycin  certain medicines for fungal infections like ketoconazole and itraconazole  certain medicines for seizures like carbamazepine, phenobarbital, phenytoin  gemfibrozil  nefazodone  rifampin  St. John's wort This list may not describe all possible interactions. Give your health care provider a list of all the medicines, herbs, non-prescription drugs, or dietary supplements you use. Also tell them if you smoke, drink alcohol, or use illegal drugs. Some items may interact with your medicine. What should I watch for while using this medicine? Your condition will be monitored carefully while you are receiving this medicine. You will  need important blood work done while you are taking this medicine. This medicine can cause serious allergic reactions. To reduce your risk you will need to take other medicine(s)  before treatment with this medicine. If you experience allergic reactions like skin rash, itching or hives, swelling of the face, lips, or tongue, tell your doctor or health care professional right away. In some cases, you may be given additional medicines to help with side effects. Follow all directions for their use. This drug may make you feel generally unwell. This is not uncommon, as chemotherapy can affect healthy cells as well as cancer cells. Report any side effects. Continue your course of treatment even though you feel ill unless your doctor tells you to stop. Call your doctor or health care professional for advice if you get a fever, chills or sore throat, or other symptoms of a cold or flu. Do not treat yourself. This drug decreases your body's ability to fight infections. Try to avoid being around people who are sick. This medicine may increase your risk to bruise or bleed. Call your doctor or health care professional if you notice any unusual bleeding. Be careful brushing and flossing your teeth or using a toothpick because you may get an infection or bleed more easily. If you have any dental work done, tell your dentist you are receiving this medicine. Avoid taking products that contain aspirin, acetaminophen, ibuprofen, naproxen, or ketoprofen unless instructed by your doctor. These medicines may hide a fever. Do not become pregnant while taking this medicine. Women should inform their doctor if they wish to become pregnant or think they might be pregnant. There is a potential for serious side effects to an unborn child. Talk to your health care professional or pharmacist for more information. Do not breast-feed an infant while taking this medicine. Men are advised not to father a child while receiving this medicine. This product may contain alcohol. Ask your pharmacist or healthcare provider if this medicine contains alcohol. Be sure to tell all healthcare providers you are taking this  medicine. Certain medicines, like metronidazole and disulfiram, can cause an unpleasant reaction when taken with alcohol. The reaction includes flushing, headache, nausea, vomiting, sweating, and increased thirst. The reaction can last from 30 minutes to several hours. What side effects may I notice from receiving this medicine? Side effects that you should report to your doctor or health care professional as soon as possible:  allergic reactions like skin rash, itching or hives, swelling of the face, lips, or tongue  breathing problems  changes in vision  fast, irregular heartbeat  high or low blood pressure  mouth sores  pain, tingling, numbness in the hands or feet  signs of decreased platelets or bleeding - bruising, pinpoint red spots on the skin, black, tarry stools, blood in the urine  signs of decreased red blood cells - unusually weak or tired, feeling faint or lightheaded, falls  signs of infection - fever or chills, cough, sore throat, pain or difficulty passing urine  signs and symptoms of liver injury like dark yellow or brown urine; general ill feeling or flu-like symptoms; light-colored stools; loss of appetite; nausea; right upper belly pain; unusually weak or tired; yellowing of the eyes or skin  swelling of the ankles, feet, hands  unusually slow heartbeat Side effects that usually do not require medical attention (report to your doctor or health care professional if they continue or are bothersome):  diarrhea  hair loss  loss of appetite  muscle   or joint pain  nausea, vomiting  pain, redness, or irritation at site where injected  tiredness This list may not describe all possible side effects. Call your doctor for medical advice about side effects. You may report side effects to FDA at 1-800-FDA-1088. Where should I keep my medicine? This drug is given in a hospital or clinic and will not be stored at home. NOTE: This sheet is a summary. It may not  cover all possible information. If you have questions about this medicine, talk to your doctor, pharmacist, or health care provider.  2020 Elsevier/Gold Standard (2017-04-29 13:14:55)  Carboplatin injection What is this medicine? CARBOPLATIN (KAR boe pla tin) is a chemotherapy drug. It targets fast dividing cells, like cancer cells, and causes these cells to die. This medicine is used to treat ovarian cancer and many other cancers. This medicine may be used for other purposes; ask your health care provider or pharmacist if you have questions. COMMON BRAND NAME(S): Paraplatin What should I tell my health care provider before I take this medicine? They need to know if you have any of these conditions:  blood disorders  hearing problems  kidney disease  recent or ongoing radiation therapy  an unusual or allergic reaction to carboplatin, cisplatin, other chemotherapy, other medicines, foods, dyes, or preservatives  pregnant or trying to get pregnant  breast-feeding How should I use this medicine? This drug is usually given as an infusion into a vein. It is administered in a hospital or clinic by a specially trained health care professional. Talk to your pediatrician regarding the use of this medicine in children. Special care may be needed. Overdosage: If you think you have taken too much of this medicine contact a poison control center or emergency room at once. NOTE: This medicine is only for you. Do not share this medicine with others. What if I miss a dose? It is important not to miss a dose. Call your doctor or health care professional if you are unable to keep an appointment. What may interact with this medicine?  medicines for seizures  medicines to increase blood counts like filgrastim, pegfilgrastim, sargramostim  some antibiotics like amikacin, gentamicin, neomycin, streptomycin, tobramycin  vaccines Talk to your doctor or health care professional before taking any of  these medicines:  acetaminophen  aspirin  ibuprofen  ketoprofen  naproxen This list may not describe all possible interactions. Give your health care provider a list of all the medicines, herbs, non-prescription drugs, or dietary supplements you use. Also tell them if you smoke, drink alcohol, or use illegal drugs. Some items may interact with your medicine. What should I watch for while using this medicine? Your condition will be monitored carefully while you are receiving this medicine. You will need important blood work done while you are taking this medicine. This drug may make you feel generally unwell. This is not uncommon, as chemotherapy can affect healthy cells as well as cancer cells. Report any side effects. Continue your course of treatment even though you feel ill unless your doctor tells you to stop. In some cases, you may be given additional medicines to help with side effects. Follow all directions for their use. Call your doctor or health care professional for advice if you get a fever, chills or sore throat, or other symptoms of a cold or flu. Do not treat yourself. This drug decreases your body's ability to fight infections. Try to avoid being around people who are sick. This medicine may increase your risk   to bruise or bleed. Call your doctor or health care professional if you notice any unusual bleeding. Be careful brushing and flossing your teeth or using a toothpick because you may get an infection or bleed more easily. If you have any dental work done, tell your dentist you are receiving this medicine. Avoid taking products that contain aspirin, acetaminophen, ibuprofen, naproxen, or ketoprofen unless instructed by your doctor. These medicines may hide a fever. Do not become pregnant while taking this medicine. Women should inform their doctor if they wish to become pregnant or think they might be pregnant. There is a potential for serious side effects to an unborn child.  Talk to your health care professional or pharmacist for more information. Do not breast-feed an infant while taking this medicine. What side effects may I notice from receiving this medicine? Side effects that you should report to your doctor or health care professional as soon as possible:  allergic reactions like skin rash, itching or hives, swelling of the face, lips, or tongue  signs of infection - fever or chills, cough, sore throat, pain or difficulty passing urine  signs of decreased platelets or bleeding - bruising, pinpoint red spots on the skin, black, tarry stools, nosebleeds  signs of decreased red blood cells - unusually weak or tired, fainting spells, lightheadedness  breathing problems  changes in hearing  changes in vision  chest pain  high blood pressure  low blood counts - This drug may decrease the number of white blood cells, red blood cells and platelets. You may be at increased risk for infections and bleeding.  nausea and vomiting  pain, swelling, redness or irritation at the injection site  pain, tingling, numbness in the hands or feet  problems with balance, talking, walking  trouble passing urine or change in the amount of urine Side effects that usually do not require medical attention (report to your doctor or health care professional if they continue or are bothersome):  hair loss  loss of appetite  metallic taste in the mouth or changes in taste This list may not describe all possible side effects. Call your doctor for medical advice about side effects. You may report side effects to FDA at 1-800-FDA-1088. Where should I keep my medicine? This drug is given in a hospital or clinic and will not be stored at home. NOTE: This sheet is a summary. It may not cover all possible information. If you have questions about this medicine, talk to your doctor, pharmacist, or health care provider.  2020 Elsevier/Gold Standard (2007-12-01 14:38:05)

## 2020-09-11 NOTE — Progress Notes (Signed)
Met with patient at registration to introduce myself as Financial Resource Specialist and to offer available resources.  Discussed one-time $1000 [employer] grant and qualifications to assist with personal expenses while going through treatment.  Gave him my card if interested in applying and for any additional financial questions or concerns.  

## 2020-09-12 ENCOUNTER — Ambulatory Visit: Payer: No Typology Code available for payment source

## 2020-09-12 ENCOUNTER — Other Ambulatory Visit: Payer: Self-pay

## 2020-09-12 ENCOUNTER — Ambulatory Visit: Payer: No Typology Code available for payment source | Admitting: Radiation Oncology

## 2020-09-12 ENCOUNTER — Ambulatory Visit
Admission: RE | Admit: 2020-09-12 | Discharge: 2020-09-12 | Disposition: A | Discharge status: Home / Self Care | Payer: No Typology Code available for payment source | Source: Ambulatory Visit | Attending: Radiation Oncology | Admitting: Radiation Oncology

## 2020-09-12 DIAGNOSIS — Z5111 Encounter for antineoplastic chemotherapy: Secondary | ICD-10-CM | POA: Diagnosis not present

## 2020-09-13 ENCOUNTER — Ambulatory Visit
Admission: RE | Admit: 2020-09-13 | Discharge: 2020-09-13 | Disposition: A | Discharge status: Home / Self Care | Payer: No Typology Code available for payment source | Source: Ambulatory Visit | Attending: Radiation Oncology | Admitting: Radiation Oncology

## 2020-09-13 ENCOUNTER — Ambulatory Visit: Payer: No Typology Code available for payment source

## 2020-09-13 ENCOUNTER — Other Ambulatory Visit: Payer: Self-pay

## 2020-09-13 DIAGNOSIS — Z5111 Encounter for antineoplastic chemotherapy: Secondary | ICD-10-CM | POA: Diagnosis not present

## 2020-09-14 ENCOUNTER — Other Ambulatory Visit: Payer: Self-pay

## 2020-09-14 ENCOUNTER — Ambulatory Visit: Payer: No Typology Code available for payment source

## 2020-09-14 ENCOUNTER — Ambulatory Visit
Admission: RE | Admit: 2020-09-14 | Discharge: 2020-09-14 | Disposition: A | Discharge status: Home / Self Care | Payer: No Typology Code available for payment source | Source: Ambulatory Visit | Attending: Radiation Oncology | Admitting: Radiation Oncology

## 2020-09-14 ENCOUNTER — Telehealth: Payer: Self-pay | Admitting: Hematology

## 2020-09-14 DIAGNOSIS — Z5111 Encounter for antineoplastic chemotherapy: Secondary | ICD-10-CM | POA: Diagnosis not present

## 2020-09-14 NOTE — Telephone Encounter (Signed)
Scheduled per 12/16 los, patient has been called and notified of upcoming appointments.

## 2020-09-15 ENCOUNTER — Other Ambulatory Visit: Payer: Self-pay

## 2020-09-15 ENCOUNTER — Ambulatory Visit: Payer: No Typology Code available for payment source

## 2020-09-15 ENCOUNTER — Ambulatory Visit
Admission: RE | Admit: 2020-09-15 | Discharge: 2020-09-15 | Disposition: A | Discharge status: Home / Self Care | Payer: No Typology Code available for payment source | Source: Ambulatory Visit | Attending: Radiation Oncology | Admitting: Radiation Oncology

## 2020-09-15 DIAGNOSIS — Z5111 Encounter for antineoplastic chemotherapy: Secondary | ICD-10-CM | POA: Diagnosis not present

## 2020-09-15 DIAGNOSIS — C158 Malignant neoplasm of overlapping sites of esophagus: Secondary | ICD-10-CM

## 2020-09-15 MED ORDER — SONAFINE EX EMUL
1.0000 "application " | Freq: Once | CUTANEOUS | Status: AC
  Administered 2020-09-15: 1 via TOPICAL

## 2020-09-15 NOTE — Progress Notes (Signed)
Pt here for patient teaching.  Pt given Radiation and You booklet, skin care instructions and Sonafine.  Reviewed areas of pertinence such as fatigue, hair loss, skin changes and throat changes . Pt able to give teach back of to pat skin and use unscented/gentle soap,apply Sonafine bid and avoid applying anything to skin within 4 hours of treatment. Pt verbalizes understanding of information given and will contact nursing with any questions or concerns.     Wylie Coon M. Alois Mincer RN, BSN     

## 2020-09-18 ENCOUNTER — Other Ambulatory Visit: Payer: No Typology Code available for payment source

## 2020-09-18 ENCOUNTER — Inpatient Hospital Stay: Payer: No Typology Code available for payment source

## 2020-09-18 ENCOUNTER — Ambulatory Visit: Payer: No Typology Code available for payment source | Admitting: Hematology

## 2020-09-18 ENCOUNTER — Inpatient Hospital Stay (HOSPITAL_BASED_OUTPATIENT_CLINIC_OR_DEPARTMENT_OTHER): Payer: No Typology Code available for payment source | Admitting: Hematology

## 2020-09-18 ENCOUNTER — Other Ambulatory Visit: Payer: Self-pay

## 2020-09-18 ENCOUNTER — Ambulatory Visit: Payer: No Typology Code available for payment source | Admitting: Radiation Oncology

## 2020-09-18 ENCOUNTER — Ambulatory Visit
Admission: RE | Admit: 2020-09-18 | Discharge: 2020-09-18 | Disposition: A | Discharge status: Home / Self Care | Payer: No Typology Code available for payment source | Source: Ambulatory Visit | Attending: Radiation Oncology | Admitting: Radiation Oncology

## 2020-09-18 VITALS — BP 122/72 | HR 71 | Temp 98.3°F | Resp 18 | Ht 72.0 in | Wt 197.5 lb

## 2020-09-18 DIAGNOSIS — K922 Gastrointestinal hemorrhage, unspecified: Secondary | ICD-10-CM

## 2020-09-18 DIAGNOSIS — D500 Iron deficiency anemia secondary to blood loss (chronic): Secondary | ICD-10-CM | POA: Diagnosis not present

## 2020-09-18 DIAGNOSIS — C158 Malignant neoplasm of overlapping sites of esophagus: Secondary | ICD-10-CM | POA: Diagnosis not present

## 2020-09-18 DIAGNOSIS — Z5111 Encounter for antineoplastic chemotherapy: Secondary | ICD-10-CM | POA: Diagnosis not present

## 2020-09-18 LAB — CBC WITH DIFFERENTIAL (CANCER CENTER ONLY)
Abs Immature Granulocytes: 0.1 10*3/uL — ABNORMAL HIGH (ref 0.00–0.07)
Basophils Absolute: 0 10*3/uL (ref 0.0–0.1)
Basophils Relative: 1 %
Eosinophils Absolute: 0 10*3/uL (ref 0.0–0.5)
Eosinophils Relative: 0 %
HCT: 30.5 % — ABNORMAL LOW (ref 39.0–52.0)
Hemoglobin: 9.9 g/dL — ABNORMAL LOW (ref 13.0–17.0)
Immature Granulocytes: 1 %
Lymphocytes Relative: 2 %
Lymphs Abs: 0.1 10*3/uL — ABNORMAL LOW (ref 0.7–4.0)
MCH: 26.7 pg (ref 26.0–34.0)
MCHC: 32.5 g/dL (ref 30.0–36.0)
MCV: 82.2 fL (ref 80.0–100.0)
Monocytes Absolute: 0.4 10*3/uL (ref 0.1–1.0)
Monocytes Relative: 6 %
Neutro Abs: 6.6 10*3/uL (ref 1.7–7.7)
Neutrophils Relative %: 90 %
Platelet Count: 216 10*3/uL (ref 150–400)
RBC: 3.71 MIL/uL — ABNORMAL LOW (ref 4.22–5.81)
RDW: 19.3 % — ABNORMAL HIGH (ref 11.5–15.5)
WBC Count: 7.3 10*3/uL (ref 4.0–10.5)
nRBC: 0 % (ref 0.0–0.2)

## 2020-09-18 LAB — CMP (CANCER CENTER ONLY)
ALT: 30 U/L (ref 0–44)
AST: 26 U/L (ref 15–41)
Albumin: 3.6 g/dL (ref 3.5–5.0)
Alkaline Phosphatase: 122 U/L (ref 38–126)
Anion gap: 9 (ref 5–15)
BUN: 11 mg/dL (ref 8–23)
CO2: 23 mmol/L (ref 22–32)
Calcium: 8.9 mg/dL (ref 8.9–10.3)
Chloride: 100 mmol/L (ref 98–111)
Creatinine: 0.67 mg/dL (ref 0.61–1.24)
GFR, Estimated: >60 mL/min (ref >60)
Glucose, Bld: 125 mg/dL — ABNORMAL HIGH (ref 70–99)
Potassium: 4.3 mmol/L (ref 3.5–5.1)
Sodium: 132 mmol/L — ABNORMAL LOW (ref 135–145)
Total Bilirubin: 1.1 mg/dL (ref 0.3–1.2)
Total Protein: 6.6 g/dL (ref 6.5–8.1)

## 2020-09-18 MED ORDER — SODIUM CHLORIDE 0.9 % IV SOLN
20.0000 mg | Freq: Once | INTRAVENOUS | Status: AC
  Administered 2020-09-18: 20 mg via INTRAVENOUS
  Filled 2020-09-18: qty 20

## 2020-09-18 MED ORDER — LORATADINE 10 MG PO TABS: 10.0000 mg | ORAL_TABLET | Freq: Once | ORAL | Status: DC

## 2020-09-18 MED ORDER — PALONOSETRON HCL INJECTION 0.25 MG/5ML
0.2500 mg | Freq: Once | INTRAVENOUS | Status: AC
  Administered 2020-09-18: 0.25 mg via INTRAVENOUS

## 2020-09-18 MED ORDER — HEPARIN SOD (PORK) LOCK FLUSH 100 UNIT/ML IV SOLN
500.0000 [IU] | Freq: Once | INTRAVENOUS | Status: AC | PRN
  Administered 2020-09-18: 500 [IU]
  Filled 2020-09-18: qty 5

## 2020-09-18 MED ORDER — ACETAMINOPHEN 325 MG PO TABS
650.0000 mg | ORAL_TABLET | Freq: Once | ORAL | Status: AC
  Administered 2020-09-18: 650 mg via ORAL

## 2020-09-18 MED ORDER — SODIUM CHLORIDE 0.9 % IV SOLN
298.8000 mg | Freq: Once | INTRAVENOUS | Status: AC
Start: 2020-09-18 — End: 2020-09-18
  Administered 2020-09-18: 300 mg via INTRAVENOUS
  Filled 2020-09-18: qty 30

## 2020-09-18 MED ORDER — LORATADINE 10 MG PO TABS
ORAL_TABLET | ORAL | Status: AC
  Filled 2020-09-18: qty 1

## 2020-09-18 MED ORDER — PALONOSETRON HCL INJECTION 0.25 MG/5ML
INTRAVENOUS | Status: AC
  Filled 2020-09-18: qty 5

## 2020-09-18 MED ORDER — SODIUM CHLORIDE 0.9 % IV SOLN
Freq: Once | INTRAVENOUS | Status: AC
  Filled 2020-09-18: qty 250

## 2020-09-18 MED ORDER — ACETAMINOPHEN 325 MG PO TABS
ORAL_TABLET | ORAL | Status: AC
  Filled 2020-09-18: qty 2

## 2020-09-18 MED ORDER — SODIUM CHLORIDE 0.9 % IV SOLN
200.0000 mg | Freq: Once | INTRAVENOUS | Status: AC
  Administered 2020-09-18: 200 mg via INTRAVENOUS
  Filled 2020-09-18: qty 200

## 2020-09-18 MED ORDER — SODIUM CHLORIDE 0.9% FLUSH
10.0000 mL | INTRAVENOUS | Status: DC | PRN
  Administered 2020-09-18: 10 mL
  Filled 2020-09-18: qty 10

## 2020-09-18 MED ORDER — DIPHENHYDRAMINE HCL 50 MG/ML IJ SOLN
INTRAMUSCULAR | Status: AC
  Filled 2020-09-18: qty 1

## 2020-09-18 MED ORDER — FAMOTIDINE IN NACL 20-0.9 MG/50ML-% IV SOLN
INTRAVENOUS | Status: AC
  Filled 2020-09-18: qty 50

## 2020-09-18 MED ORDER — DIPHENHYDRAMINE HCL 50 MG/ML IJ SOLN
50.0000 mg | Freq: Once | INTRAMUSCULAR | Status: AC
  Administered 2020-09-18: 50 mg via INTRAVENOUS

## 2020-09-18 MED ORDER — SODIUM CHLORIDE 0.9% FLUSH
10.0000 mL | Freq: Once | INTRAVENOUS | Status: AC | PRN
  Administered 2020-09-18: 10 mL
  Filled 2020-09-18: qty 10

## 2020-09-18 MED ORDER — SODIUM CHLORIDE 0.9 % IV SOLN
50.0000 mg/m2 | Freq: Once | INTRAVENOUS | Status: AC
  Administered 2020-09-18: 108 mg via INTRAVENOUS
  Filled 2020-09-18: qty 18

## 2020-09-18 MED ORDER — FAMOTIDINE IN NACL 20-0.9 MG/50ML-% IV SOLN
20.0000 mg | Freq: Once | INTRAVENOUS | Status: AC
  Administered 2020-09-18: 20 mg via INTRAVENOUS

## 2020-09-18 NOTE — Progress Notes (Signed)
Patient declined post-observation period following Venofer infusion. VS retaken and remained stable. Patient had no complaints upon discharge and did not present in any distress.

## 2020-09-18 NOTE — Patient Instructions (Signed)
[facility] Discharge Instructions for Patients Receiving Chemotherapy  Today you received the following chemotherapy agents: Paclitaxel (Taxol) and Carboplatin (Paraplatin)  To help prevent nausea and vomiting after your treatment, we encourage you to take your nausea medication as directed.    If you develop nausea and vomiting that is not controlled by your nausea medication, call the clinic.   BELOW ARE SYMPTOMS THAT SHOULD BE REPORTED IMMEDIATELY:  *FEVER GREATER THAN 100.5 F  *CHILLS WITH OR WITHOUT FEVER  NAUSEA AND VOMITING THAT IS NOT CONTROLLED WITH YOUR NAUSEA MEDICATION  *UNUSUAL SHORTNESS OF BREATH  *UNUSUAL BRUISING OR BLEEDING  TENDERNESS IN MOUTH AND THROAT WITH OR WITHOUT PRESENCE OF ULCERS  *URINARY PROBLEMS  *BOWEL PROBLEMS  UNUSUAL RASH Items with * indicate a potential emergency and should be followed up as soon as possible.  Feel free to call the clinic should you have any questions or concerns. The clinic phone number is [phone].  Please show the CHEMO ALERT CARD at check-in to the Emergency Department and triage nurse.   

## 2020-09-19 ENCOUNTER — Ambulatory Visit
Admission: RE | Admit: 2020-09-19 | Discharge: 2020-09-19 | Disposition: A | Discharge status: Home / Self Care | Payer: No Typology Code available for payment source | Source: Ambulatory Visit | Attending: Radiation Oncology | Admitting: Radiation Oncology

## 2020-09-19 ENCOUNTER — Other Ambulatory Visit: Payer: Self-pay

## 2020-09-19 ENCOUNTER — Ambulatory Visit: Payer: No Typology Code available for payment source

## 2020-09-19 DIAGNOSIS — Z5111 Encounter for antineoplastic chemotherapy: Secondary | ICD-10-CM | POA: Diagnosis not present

## 2020-09-20 ENCOUNTER — Other Ambulatory Visit: Payer: Self-pay

## 2020-09-20 ENCOUNTER — Ambulatory Visit: Payer: No Typology Code available for payment source

## 2020-09-20 ENCOUNTER — Ambulatory Visit
Admission: RE | Admit: 2020-09-20 | Discharge: 2020-09-20 | Disposition: A | Discharge status: Home / Self Care | Payer: No Typology Code available for payment source | Source: Ambulatory Visit | Attending: Radiation Oncology | Admitting: Radiation Oncology

## 2020-09-20 DIAGNOSIS — Z5111 Encounter for antineoplastic chemotherapy: Secondary | ICD-10-CM | POA: Diagnosis not present

## 2020-09-21 ENCOUNTER — Ambulatory Visit: Payer: No Typology Code available for payment source

## 2020-09-21 ENCOUNTER — Ambulatory Visit
Admission: RE | Admit: 2020-09-21 | Discharge: 2020-09-21 | Disposition: A | Discharge status: Home / Self Care | Payer: No Typology Code available for payment source | Source: Ambulatory Visit | Attending: Radiation Oncology | Admitting: Radiation Oncology

## 2020-09-21 DIAGNOSIS — Z5111 Encounter for antineoplastic chemotherapy: Secondary | ICD-10-CM | POA: Diagnosis not present

## 2020-09-22 ENCOUNTER — Other Ambulatory Visit: Payer: Self-pay

## 2020-09-22 ENCOUNTER — Ambulatory Visit
Admission: RE | Admit: 2020-09-22 | Discharge: 2020-09-22 | Disposition: A | Discharge status: Home / Self Care | Payer: No Typology Code available for payment source | Source: Ambulatory Visit | Attending: Radiation Oncology | Admitting: Radiation Oncology

## 2020-09-22 ENCOUNTER — Ambulatory Visit: Payer: No Typology Code available for payment source

## 2020-09-22 DIAGNOSIS — Z5111 Encounter for antineoplastic chemotherapy: Secondary | ICD-10-CM | POA: Diagnosis not present

## 2020-09-25 ENCOUNTER — Other Ambulatory Visit: Payer: Self-pay | Admitting: Hematology

## 2020-09-25 ENCOUNTER — Ambulatory Visit
Admission: RE | Admit: 2020-09-25 | Discharge: 2020-09-25 | Disposition: A | Discharge status: Home / Self Care | Payer: No Typology Code available for payment source | Source: Ambulatory Visit | Attending: Radiation Oncology | Admitting: Radiation Oncology

## 2020-09-25 ENCOUNTER — Telehealth: Payer: Self-pay | Admitting: *Deleted

## 2020-09-25 ENCOUNTER — Other Ambulatory Visit: Payer: Self-pay

## 2020-09-25 ENCOUNTER — Ambulatory Visit: Payer: No Typology Code available for payment source | Admitting: Cardiology

## 2020-09-25 DIAGNOSIS — D500 Iron deficiency anemia secondary to blood loss (chronic): Secondary | ICD-10-CM

## 2020-09-25 DIAGNOSIS — Z5111 Encounter for antineoplastic chemotherapy: Secondary | ICD-10-CM | POA: Diagnosis not present

## 2020-09-25 DIAGNOSIS — C158 Malignant neoplasm of overlapping sites of esophagus: Secondary | ICD-10-CM

## 2020-09-25 MED ORDER — MAGIC MOUTHWASH W/LIDOCAINE: ORAL | 1 refills | Status: DC

## 2020-09-25 MED ORDER — SUCRALFATE 1 GM/10ML PO SUSP: 1.0000 g | Freq: Two times a day (BID) | ORAL | 1 refills | Status: DC

## 2020-09-25 NOTE — Telephone Encounter (Signed)
Dr. Irene Limbo wrote prescriptions for Carafate and Magic Mouthwash. Contacted patient - he will pick up at front desk of [place] when he comes for Radiation appt today

## 2020-09-25 NOTE — Progress Notes (Signed)
HEMATOLOGY/ONCOLOGY CLINIC NOTE  Date of Service: 09/25/2020  Patient Care Team: Iona Hansen, NP as PCP - General (Nurse Practitioner) Johney Maine, MD as Consulting Physician (Hematology) Radonna Ricker, RN as Oncology Nurse Navigator  CHIEF COMPLAINTS/PURPOSE OF CONSULTATION:  GEJ Adenocarcinoma   HISTORY OF PRESENTING ILLNESS:  Donald Wade is a wonderful 65 y.o. 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 Pt is accompanied today by his wife, Darl Pikes. The pt reports that he is doing well overall.  The pt reports that he feels better today than he has in a month. Pt was seen by Vascular Surgery for his Peripheral Artery Disease and was placed on Plavix and Cilostazol in September. Pt began having black, tarry stools about 1.5 months ago that he thought was medication-related. He became very lightheaded and dizzy prior to his his hospital admission on 08/21/2020. Pt was given 2 units of PRBC and IV Iron in the hospital. Pt was placed on Protonix twice per day and was taken off of Plavix and Cilostazol prior to discharge.   Pt denies any difficulty swallowing and is taking measures to eat small bites. He has had noticeable acid reflux in the last three months that has worsened recently.   He has Hypertension and Hyperlipidemia. He denies any history of COPD, Emphysema, Allergies, Diabetes, or ongoing heart issues. Pt is up to date with his Colonoscopies and has never had any Prostate issues.    Pt has had Upper Endoscopy completed on 08/22/2020 with results revealing "- Likely malignant esophageal tumor was found in the lower third of the esophagus and at the gastroesophageal junction. Biopsied. 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 - Normal gastric body, antrum and prepyloric region of the stomach. - Normal duodenal bulb, first portion of the duodenum, second portion of the duodenum and third  portion of the duodenum. - The examination was otherwise normal."  Of note prior to the patient's visit today, pt has had Esophageal/Stomach Mass Surgical Pathology [phone]) completed on 08/22/2020 with results revealing "A. ESOPHAGUS, DISTAL MASS, BIOPSY:  - Poorly differentiated adenocarcinoma with extracellular mucin and  signet ring cells. B. STOMACH, MASS, BIOPSY: - Poorly differentiated adenocarcinoma with extracellular mucin and  signet ring cells."   Pt has had CT C/A/P ([id number]) ([id number]) completed on 08/22/2020 with results revealing "1. Thickened appearance of the wall of the stomach adjacent to the gastroesophageal junction as well as proximal gastric wall concerning for infiltrative neoplasm. 2. Rounded gastrohepatic lymph nodes as well as mildly enlarged retroperitoneal lymph nodes concerning for early nodal metastatic disease. 3. No evidence of metastatic disease in the chest. 4. Colonic diverticulosis. 5. Aortic Atherosclerosis."  Most recent lab results (08/23/2020) of CBC is as follows: all values are WNL except for WBC at 3.9K, RBC at 2.93, Hgb at 7.6, HCT at 25.1, MCH at 25.9, RDW at 16.4, Glucose at 101, Calcium at 8.4, Total Protein at 5.8, Albumin at 3.2.  On review of systems, pt denies dysphagia, SOB, abdominal pain, dysuria, back pain, leg swelling and any other symptoms.   On PMHx the pt reports PAD, HTN, HLD, Appendectomy, Left Heart Cath. On Social Hx the pt reports that he is a non-smoker. He drinks 3-4 beers per day on average. Pt denies any other recreational drug use.  On Family Hx the pt reports that his mother had Colon Cancer in her mid-60's and his father had Throat Cancer after an extensive  history of cigar smoking.   INTERVAL HISTORY Donald Wade was seen in follow-up for toxicity check and clinical evaluation prior to his second cycle of concurrent carboplatin/Taxol with radiation. He notes no acute primary toxicities from chemotherapy or  radiation at this time. We discussed his PET CT scan results in details. He has supportive antiemetics which she notes he has not had to use much. Tolerated IV Venofer without any issues. Notes no further evidence of GI bleeding. Hemoglobin has been holding and he has not needed any additional transfusions since his last clinic visit.  No fevers no chills no night sweats.  Able to maintain reasonable p.o. intake at this time.  MEDICAL HISTORY:  Past Medical History:  Diagnosis Date  . Anemia 08/22/2020  . Heart murmur   . Hyperlipidemia   . Hypertension     SURGICAL HISTORY: Past Surgical History:  Procedure Laterality Date  . APPENDECTOMY  1980  . BIOPSY  08/22/2020   Procedure: BIOPSY;  Surgeon: Clarene Essex, MD;  Location: [facility] ENDOSCOPY;  Service: Endoscopy;;  . ESOPHAGOGASTRODUODENOSCOPY  08/22/2020  . ESOPHAGOGASTRODUODENOSCOPY N/A 08/22/2020   Procedure: ESOPHAGOGASTRODUODENOSCOPY (EGD);  Surgeon: Clarene Essex, MD;  Location: [facility];  Service: Endoscopy;  Laterality: N/A;  . IR IMAGING GUIDED PORT INSERTION  09/07/2020  . LEFT HEART CATH AND CORONARY ANGIOGRAPHY N/A 11/23/2019   Procedure: LEFT HEART CATH AND CORONARY ANGIOGRAPHY;  Surgeon: Nigel Mormon, MD;  Location: [facility] CV LAB;  Service: Cardiovascular;  Laterality: N/A;    SOCIAL HISTORY: Social History   Socioeconomic History  . Marital status: Married    Spouse name: Not on file  . Number of children: 2  . Years of education: Not on file  . Highest education level: Not on file  Occupational History  . Not on file  Tobacco Use  . Smoking status: Never Smoker  . Smokeless tobacco: Never Used  Vaping Use  . Vaping Use: Never used  Substance and Sexual Activity  . Alcohol use: Yes    Comment: 2 beers daily  . Drug use: Never  . Sexual activity: Not on file  Other Topics Concern  . Not on file  Social History Narrative  . Not on file   Social Determinants of Health   Financial  Resource Strain: Not on file  Food Insecurity: Not on file  Transportation Needs: Not on file  Physical Activity: Not on file  Stress: Not on file  Social Connections: Not on file  Intimate Partner Violence: Not on file    FAMILY HISTORY: Family History  Problem Relation Age of Onset  . Heart disease Mother   . Atrial fibrillation Mother     ALLERGIES:  is allergic to bee venom and penicillin g.  MEDICATIONS:  Current Outpatient Medications  Medication Sig Dispense Refill  . acetaminophen (TYLENOL) 325 MG tablet Take 650 mg by mouth every 6 (six) hours as needed for mild pain.    Marland Kitchen amLODipine (NORVASC) 5 MG tablet Take 1 tablet (5 mg total) by mouth daily. 30 tablet 0  . atorvastatin (LIPITOR) 40 MG tablet Take 1 tablet (40 mg total) by mouth daily. 90 tablet 3  . buPROPion (WELLBUTRIN XL) 150 MG 24 hr tablet Take 150 mg by mouth daily.     Marland Kitchen dexamethasone (DECADRON) 4 MG tablet Take 2 tablets (8 mg total) by mouth daily. Start the day after chemotherapy for 2 days. 30 tablet 1  . EPINEPHrine 0.3 mg/0.3 mL IJ SOAJ injection Inject 0.3 mg  into the skin as needed for anaphylaxis (call 911).    . feeding supplement (ENSURE ENLIVE / ENSURE PLUS) LIQD Take 237 mLs by mouth 2 (two) times daily between meals. 237 mL 12  . fexofenadine (ALLEGRA) 180 MG tablet Take 180 mg by mouth daily as needed for allergies.    Marland Kitchen lidocaine-prilocaine (EMLA) cream Apply to affected area once 30 g 3  . LORazepam (ATIVAN) 0.5 MG tablet Take 1 tablet (0.5 mg total) by mouth every 6 (six) hours as needed (Nausea or vomiting). 30 tablet 0  . metoprolol succinate (TOPROL XL) 50 MG 24 hr tablet Take 1 tablet (50 mg total) by mouth daily. (Patient taking differently: Take 25 mg by mouth daily.) 90 tablet 2  . Multiple Vitamin (MULTI-VITAMIN) tablet Take 1 tablet by mouth daily.    . Omega-3 1000 MG CAPS Take 1,000 mg by mouth once a week.    . ondansetron (ZOFRAN) 8 MG tablet Take 1 tablet (8 mg total) by mouth 2  (two) times daily as needed for refractory nausea / vomiting. Start on day 3 after chemo. 30 tablet 1  . pantoprazole (PROTONIX) 40 MG tablet Take 1 tablet (40 mg total) by mouth 2 (two) times daily. 60 tablet 2  . polyethylene glycol (MIRALAX / GLYCOLAX) 17 g packet Take 17 g by mouth daily as needed for mild constipation. 14 each 0  . prochlorperazine (COMPAZINE) 10 MG tablet Take 1 tablet (10 mg total) by mouth every 6 (six) hours as needed (Nausea or vomiting). 30 tablet 1  . vitamin B-12 (CYANOCOBALAMIN) 1000 MCG tablet Take 1,000 mcg by mouth daily.     No current facility-administered medications for this visit.    REVIEW OF SYSTEMS:    .10 Point review of Systems was done is negative except as noted above.   PHYSICAL EXAMINATION: ECOG PERFORMANCE STATUS: 1 - Symptomatic but completely ambulatory  Vital signs reviewed . GENERAL:alert, in no acute distress and comfortable SKIN: no acute rashes, no significant lesions EYES: conjunctiva are pink and non-injected, sclera anicteric OROPHARYNX: MMM, no exudates, no oropharyngeal erythema or ulceration NECK: supple, no JVD LYMPH:  no palpable lymphadenopathy in the cervical, axillary or inguinal regions LUNGS: clear to auscultation b/l with normal respiratory effort HEART: regular rate & rhythm ABDOMEN:  normoactive bowel sounds , non tender, not distended. Extremity: no pedal edema PSYCH: alert & oriented x 3 with fluent speech NEURO: no focal motor/sensory deficits  LABORATORY DATA:  I have reviewed the data as listed  CBC Latest Ref Rng & Units 09/18/2020 09/11/2020 09/07/2020  WBC 4.0 - 10.5 K/uL 7.3 4.1 4.2  Hemoglobin 13.0 - 17.0 g/dL 9.9(L) 10.4(L) 9.6(L)  Hematocrit 39.0 - 52.0 % 30.5(L) 32.8(L) 31.1(L)  Platelets 150 - 400 K/uL 216 277 300    . CMP Latest Ref Rng & Units 09/18/2020 09/11/2020 08/23/2020  Glucose 70 - 99 mg/dL 125(H) 99 101(H)  BUN 8 - 23 mg/dL 11 8 8   Creatinine 0.61 - 1.24 mg/dL 0.67 0.77 0.80   Sodium 135 - 145 mmol/L 132(L) 136 135  Potassium 3.5 - 5.1 mmol/L 4.3 4.2 3.7  Chloride 98 - 111 mmol/L 100 102 102  CO2 22 - 32 mmol/L 23 27 23   Calcium 8.9 - 10.3 mg/dL 8.9 9.0 8.4(L)  Total Protein 6.5 - 8.1 g/dL 6.6 7.1 5.8(L)  Total Bilirubin 0.3 - 1.2 mg/dL 1.1 0.9 1.1  Alkaline Phos 38 - 126 U/L 122 105 69  AST 15 - 41 U/L 26 23  22  ALT 0 - 44 U/L 30 20 21     08/22/2020 Esophageal/Stomach Mass Surgical Pathology [phone]):   RADIOGRAPHIC STUDIES: I have personally reviewed the radiological images as listed and agreed with the findings in the report. NM PET Image Initial (PI) Skull Base To Thigh  Result Date: 08/31/2020 CLINICAL DATA:  Initial treatment strategy for newly diagnosed GE junction/esophageal cancer. EXAM: NUCLEAR MEDICINE PET SKULL BASE TO THIGH TECHNIQUE: 10.1 mCi F-18 FDG was injected intravenously. Full-ring PET imaging was performed from the skull base to thigh after the radiotracer. CT data was obtained and used for attenuation correction and anatomic localization. Fasting blood glucose: 101 mg/dl COMPARISON:  CT chest abdomen pelvis dated 08/22/2020 FINDINGS: Mediastinal blood pool activity: SUV max 2.5 Liver activity: SUV max NA NECK: No hypermetabolic cervical lymphadenopathy. Incidental CT findings: none CHEST: No hypermetabolic thoracic lymphadenopathy. No suspicious pulmonary nodules. Incidental CT findings: Atherosclerotic calcifications of the aortic arch. Mild coronary atherosclerosis the LAD and right coronary artery. ABDOMEN/PELVIS: Wall thickening with hypermetabolism centered in the gastric cardia, max SUV 8.5, compatible with the patient's known neoplasm. This does involve the distal esophagus just above the GE junction (series 4/image 104) but predominantly involves the proximal stomach. Two prominent gastrohepatic nodes measuring up to 9 mm short axis (series 4/image 121), max SUV 2.5. While hypermetabolism is relatively low, the location is  certainly concerning. Scattered retroperitoneal/para-aortic nodes measuring up to 10 mm short axis, at the upper limits of normal for size but certainly increased in number, max SUV 3.8. This location is less commonly seen for nodal spread, but this finding is worrisome for nodal metastases until proven otherwise. No abnormal hypermetabolism in the liver, spleen, pancreas, or adrenal glands. Incidental CT findings: Extensive sigmoid diverticulosis, without evidence of diverticulitis. Thick-walled bladder, although underdistended. Atherosclerotic calcifications the abdominal aorta and branch vessels. SKELETON: No focal hypermetabolic activity to suggest skeletal metastasis. Incidental CT findings: Degenerative changes of the visualized thoracolumbar spine. IMPRESSION: Wall thickening/mass centered in the gastric cardia, compatible with the patient's known primary neoplasm. This predominantly involves the proximal stomach rather than the distal esophagus. Small gastrohepatic and para-aortic nodes, suspicious for nodal metastases. Electronically Signed   By: Julian Hy M.D.   On: 08/31/2020 10:23   IR IMAGING GUIDED PORT INSERTION  Result Date: 09/07/2020 CLINICAL DATA:  Carcinoma of the distal esophagus and gastroesophageal junction and need for porta cath to begin chemotherapy. EXAM: IMPLANTED PORT A CATH PLACEMENT WITH ULTRASOUND AND FLUOROSCOPIC GUIDANCE ANESTHESIA/SEDATION: 2.5 mg IV Versed; 100 mcg IV Fentanyl Total Moderate Sedation Time:  43 minutes The patient's level of consciousness and physiologic status were continuously monitored during the procedure by Radiology nursing. Additional Medications: 900 mg IV clindamycin. FLUOROSCOPY TIME:  24 seconds.  11.0 mGy. PROCEDURE: The procedure, risks, benefits, and alternatives were explained to the patient. Questions regarding the procedure were encouraged and answered. The patient understands and consents to the procedure. A time-out was performed  prior to initiating the procedure. Ultrasound was utilized to confirm patency of the right internal jugular vein. The right neck and chest were prepped with chlorhexidine in a sterile fashion, and a sterile drape was applied covering the operative field. Maximum barrier sterile technique with sterile gowns and gloves were used for the procedure. Local anesthesia was provided with 1% lidocaine. After creating a small venotomy incision, a 21 gauge needle was advanced into the right internal jugular vein under direct, real-time ultrasound guidance. Ultrasound image documentation was performed. After securing guidewire access, an 8 Fr  dilator was placed. A J-wire was kinked to measure appropriate catheter length. A subcutaneous port pocket was then created along the upper chest wall utilizing sharp and blunt dissection. Portable cautery was utilized. The pocket was irrigated with sterile saline. A single lumen power injectable port was chosen for placement. The 8 Fr catheter was tunneled from the port pocket site to the venotomy incision. The port was placed in the pocket. External catheter was trimmed to appropriate length based on guidewire measurement. At the venotomy, an 8 Fr peel-away sheath was placed over a guidewire. The catheter was then placed through the sheath and the sheath removed. Final catheter positioning was confirmed and documented with a fluoroscopic spot image. The port was accessed with a needle and aspirated and flushed with heparinized saline. The access needle was removed. The venotomy and port pocket incisions were closed with subcutaneous 3-0 Monocryl and subcuticular 4-0 Vicryl. Dermabond was applied to both incisions. COMPLICATIONS: COMPLICATIONS None FINDINGS: After catheter placement, the tip lies at the cavo-atrial junction. The catheter aspirates normally and is ready for immediate use. IMPRESSION: Placement of single lumen port a cath via right internal jugular vein. The catheter tip  lies at the cavo-atrial junction. A power injectable port a cath was placed and is ready for immediate use. Electronically Signed   By: Aletta Edouard M.D.   On: 09/07/2020 10:46    ASSESSMENT & PLAN:   65 yo male with no significant smoking or tobacco use h/o but with chronic reflux and etoh use with   1) Newly diagnosed GE junction poorly differentiated with extracellular mucin and signet ring cells. [id number] No overt obstructive symptoms at this time Esophageal/Stomach Mass Surgical Pathology [phone]) which revealed "A. ESOPHAGUS, DISTAL MASS, BIOPSY:  - Poorly differentiated adenocarcinoma with extracellular mucin and  signet ring cells. B. STOMACH, MASS, BIOPSY: - Poorly differentiated adenocarcinoma with extracellular mucin and signet ring cells."  PET/CT 08/31/2020- Wall thickening/mass centered in the gastric cardia, compatible with the patient's known primary neoplasm. This predominantly involves the proximal stomach rather than the distal esophagus. Small gastrohepatic and para-aortic nodes, suspicious for nodal metastases  2) s/p GI bleeding- melena with symptomatic anemia s/p PRBC transfusion.  No further evidence of GI bleeding at this time. 3) Iron deficiency anemia from GI bleeding - additional IV iron ordered PLAN: -Discussed patient's most recent labs from 09/18/2020 were reviewed.  Hemoglobin is stable. -PET CT scan from 12/23 reviewed in detail as noted above.  No evidence of distant metastatic disease. -No further toxicities from carboplatin/Taxol concurrent chemotherapy with radiation at this time. -We will typically plan to get a repeat PET/CT about 8 to 10 weeks after completion of his concurrent chemoradiation and depending on his functional status and treatment response and restaging results he will be evaluated for surgery by Dr. Barry Dienes. -Continue IV iron as per plan -All of the patient's additional questions were answered in details -Discussed pros and cons and  possible role of G-tube to optimize nutrition if needed.  Not currently needed at this time. -We will start patient on sucralfate and will have Magic mouthwash available as needed to address radiation related esophagitis/gastritis  FOLLOW UP: Wall thickening/mass centered in the gastric cardia, compatible with the patient's known primary neoplasm. This predominantly involves the proximal stomach rather than the distal esophagus. Small gastrohepatic and para-aortic nodes, suspicious for nodal metastases   All of the patients questions were answered with apparent satisfaction. The patient knows to call the clinic with any problems, questions or  concerns.  . The total time spent in the appointment was 35 minutes and more than 50% was on counseling and direct patient cares.   Sullivan Lone MD [place] AAHIVMS [facility] Hematology/Oncology Physician [facility]  (Office):       [phone] (Work cell):  [phone] (Fax):           [phone]

## 2020-09-26 ENCOUNTER — Ambulatory Visit
Admission: RE | Admit: 2020-09-26 | Discharge: 2020-09-26 | Disposition: A | Discharge status: Home / Self Care | Payer: No Typology Code available for payment source | Source: Ambulatory Visit | Attending: Radiation Oncology | Admitting: Radiation Oncology

## 2020-09-26 ENCOUNTER — Inpatient Hospital Stay: Payer: No Typology Code available for payment source | Admitting: Nutrition

## 2020-09-26 ENCOUNTER — Ambulatory Visit (HOSPITAL_BASED_OUTPATIENT_CLINIC_OR_DEPARTMENT_OTHER): Payer: No Typology Code available for payment source | Admitting: Medical

## 2020-09-26 ENCOUNTER — Other Ambulatory Visit: Payer: Self-pay | Admitting: Hematology

## 2020-09-26 ENCOUNTER — Inpatient Hospital Stay: Payer: No Typology Code available for payment source

## 2020-09-26 ENCOUNTER — Other Ambulatory Visit: Payer: Self-pay

## 2020-09-26 VITALS — BP 129/80 | HR 64 | Temp 98.8°F | Resp 18 | Wt 188.8 lb

## 2020-09-26 DIAGNOSIS — C158 Malignant neoplasm of overlapping sites of esophagus: Secondary | ICD-10-CM

## 2020-09-26 DIAGNOSIS — D500 Iron deficiency anemia secondary to blood loss (chronic): Secondary | ICD-10-CM

## 2020-09-26 DIAGNOSIS — L270 Generalized skin eruption due to drugs and medicaments taken internally: Secondary | ICD-10-CM

## 2020-09-26 DIAGNOSIS — K922 Gastrointestinal hemorrhage, unspecified: Secondary | ICD-10-CM

## 2020-09-26 DIAGNOSIS — Z5111 Encounter for antineoplastic chemotherapy: Secondary | ICD-10-CM | POA: Diagnosis not present

## 2020-09-26 LAB — CMP (CANCER CENTER ONLY)
ALT: 83 U/L — ABNORMAL HIGH (ref 0–44)
AST: 53 U/L — ABNORMAL HIGH (ref 15–41)
Albumin: 3.7 g/dL (ref 3.5–5.0)
Alkaline Phosphatase: 222 U/L — ABNORMAL HIGH (ref 38–126)
Anion gap: 6 (ref 5–15)
BUN: 11 mg/dL (ref 8–23)
CO2: 27 mmol/L (ref 22–32)
Calcium: 8.7 mg/dL — ABNORMAL LOW (ref 8.9–10.3)
Chloride: 99 mmol/L (ref 98–111)
Creatinine: 0.54 mg/dL — ABNORMAL LOW (ref 0.61–1.24)
GFR, Estimated: >60 mL/min (ref >60)
Glucose, Bld: 102 mg/dL — ABNORMAL HIGH (ref 70–99)
Potassium: 3.9 mmol/L (ref 3.5–5.1)
Sodium: 132 mmol/L — ABNORMAL LOW (ref 135–145)
Total Bilirubin: 0.9 mg/dL (ref 0.3–1.2)
Total Protein: 7 g/dL (ref 6.5–8.1)

## 2020-09-26 LAB — CBC WITH DIFFERENTIAL/PLATELET
Abs Immature Granulocytes: 0.02 10*3/uL (ref 0.00–0.07)
Basophils Absolute: 0 10*3/uL (ref 0.0–0.1)
Basophils Relative: 1 %
Eosinophils Absolute: 0.1 10*3/uL (ref 0.0–0.5)
Eosinophils Relative: 2 %
HCT: 32.5 % — ABNORMAL LOW (ref 39.0–52.0)
Hemoglobin: 10.6 g/dL — ABNORMAL LOW (ref 13.0–17.0)
Immature Granulocytes: 1 %
Lymphocytes Relative: 3 %
Lymphs Abs: 0.1 10*3/uL — ABNORMAL LOW (ref 0.7–4.0)
MCH: 26.8 pg (ref 26.0–34.0)
MCHC: 32.6 g/dL (ref 30.0–36.0)
MCV: 82.1 fL (ref 80.0–100.0)
Monocytes Absolute: 0.3 10*3/uL (ref 0.1–1.0)
Monocytes Relative: 10 %
Neutro Abs: 2.3 10*3/uL (ref 1.7–7.7)
Neutrophils Relative %: 83 %
Platelets: 132 10*3/uL — ABNORMAL LOW (ref 150–400)
RBC: 3.96 MIL/uL — ABNORMAL LOW (ref 4.22–5.81)
RDW: 20.5 % — ABNORMAL HIGH (ref 11.5–15.5)
WBC: 2.8 10*3/uL — ABNORMAL LOW (ref 4.0–10.5)
nRBC: 0 % (ref 0.0–0.2)

## 2020-09-26 MED ORDER — FAMOTIDINE IN NACL 20-0.9 MG/50ML-% IV SOLN
INTRAVENOUS | Status: AC
  Filled 2020-09-26: qty 50

## 2020-09-26 MED ORDER — HEPARIN SOD (PORK) LOCK FLUSH 100 UNIT/ML IV SOLN
500.0000 [IU] | Freq: Once | INTRAVENOUS | Status: AC | PRN
  Administered 2020-09-26: 500 [IU]
  Filled 2020-09-26: qty 5

## 2020-09-26 MED ORDER — PALONOSETRON HCL INJECTION 0.25 MG/5ML
0.2500 mg | Freq: Once | INTRAVENOUS | Status: AC
  Administered 2020-09-26: 0.25 mg via INTRAVENOUS

## 2020-09-26 MED ORDER — PALONOSETRON HCL INJECTION 0.25 MG/5ML
INTRAVENOUS | Status: AC
  Filled 2020-09-26: qty 5

## 2020-09-26 MED ORDER — SODIUM CHLORIDE 0.9 % IV SOLN
Freq: Once | INTRAVENOUS | Status: AC
  Filled 2020-09-26: qty 250

## 2020-09-26 MED ORDER — SODIUM CHLORIDE 0.9% FLUSH
10.0000 mL | INTRAVENOUS | Status: DC | PRN
  Administered 2020-09-26: 10 mL
  Filled 2020-09-26: qty 10

## 2020-09-26 MED ORDER — DIPHENHYDRAMINE HCL 50 MG/ML IJ SOLN
50.0000 mg | Freq: Once | INTRAMUSCULAR | Status: AC
  Administered 2020-09-26: 50 mg via INTRAVENOUS

## 2020-09-26 MED ORDER — SODIUM CHLORIDE 0.9 % IV SOLN
20.0000 mg | Freq: Once | INTRAVENOUS | Status: AC
  Administered 2020-09-26: 20 mg via INTRAVENOUS
  Filled 2020-09-26: qty 20

## 2020-09-26 MED ORDER — SODIUM CHLORIDE 0.9 % IV SOLN
50.0000 mg/m2 | Freq: Once | INTRAVENOUS | Status: AC
  Administered 2020-09-26: 108 mg via INTRAVENOUS
  Filled 2020-09-26: qty 18

## 2020-09-26 MED ORDER — FAMOTIDINE IN NACL 20-0.9 MG/50ML-% IV SOLN
20.0000 mg | Freq: Once | INTRAVENOUS | Status: AC
  Administered 2020-09-26: 20 mg via INTRAVENOUS

## 2020-09-26 MED ORDER — SODIUM CHLORIDE 0.9% FLUSH
10.0000 mL | Freq: Once | INTRAVENOUS | Status: AC | PRN
  Administered 2020-09-26: 10 mL
  Filled 2020-09-26: qty 10

## 2020-09-26 MED ORDER — CARBOPLATIN CHEMO INJECTION 450 MG/45ML
298.8000 mg | Freq: Once | INTRAVENOUS | Status: AC
  Administered 2020-09-26: 300 mg via INTRAVENOUS
  Filled 2020-09-26: qty 30

## 2020-09-26 MED ORDER — DIPHENHYDRAMINE HCL 50 MG/ML IJ SOLN
INTRAMUSCULAR | Status: AC
  Filled 2020-09-26: qty 1

## 2020-09-26 MED ORDER — SODIUM CHLORIDE 0.9 % IV SOLN
200.0000 mg | Freq: Once | INTRAVENOUS | Status: AC
  Administered 2020-09-26: 200 mg via INTRAVENOUS
  Filled 2020-09-26: qty 200

## 2020-09-26 MED ORDER — PREDNISONE 5 MG PO TABS: ORAL_TABLET | ORAL | 0 refills | Status: DC

## 2020-09-26 MED ORDER — TRIAMCINOLONE ACETONIDE 0.1 % EX LOTN: 1.0000 "application " | TOPICAL_LOTION | Freq: Three times a day (TID) | CUTANEOUS | 3 refills | Status: DC

## 2020-09-26 MED ORDER — TRIAMCINOLONE ACETONIDE 0.1 % EX LOTN: 1.0000 "application " | TOPICAL_LOTION | Freq: Three times a day (TID) | CUTANEOUS | 0 refills | Status: DC

## 2020-09-26 NOTE — Patient Instructions (Signed)
[facility] Discharge Instructions for Patients Receiving Chemotherapy  Today you received the following chemotherapy agents Paclitaxel (Taxol) and Carboplatin (Paraplatin).  To help prevent nausea and vomiting after your treatment, we encourage you to take your nausea medication as directed.   If you develop nausea and vomiting that is not controlled by your nausea medication, call the clinic.   BELOW ARE SYMPTOMS THAT SHOULD BE REPORTED IMMEDIATELY:  *FEVER GREATER THAN 100.5 F  *CHILLS WITH OR WITHOUT FEVER  NAUSEA AND VOMITING THAT IS NOT CONTROLLED WITH YOUR NAUSEA MEDICATION  *UNUSUAL SHORTNESS OF BREATH  *UNUSUAL BRUISING OR BLEEDING  TENDERNESS IN MOUTH AND THROAT WITH OR WITHOUT PRESENCE OF ULCERS  *URINARY PROBLEMS  *BOWEL PROBLEMS  UNUSUAL RASH Items with * indicate a potential emergency and should be followed up as soon as possible.  Feel free to call the clinic should you have any questions or concerns. The clinic phone number is [phone].  Please show the Rachel at check-in to the Emergency Department and triage nurse.  Iron Sucrose injection What is this medicine? IRON SUCROSE (AHY ern SOO krohs) is an iron complex. Iron is used to make healthy red blood cells, which carry oxygen and nutrients throughout the body. This medicine is used to treat iron deficiency anemia in people with chronic kidney disease. This medicine may be used for other purposes; ask your health care provider or pharmacist if you have questions. COMMON BRAND NAME(S): Venofer What should I tell my health care provider before I take this medicine? They need to know if you have any of these conditions:  anemia not caused by low iron levels  heart disease  high levels of iron in the blood  kidney disease  liver disease  an unusual or allergic reaction to iron, other medicines, foods, dyes, or preservatives  pregnant or trying to get  pregnant  breast-feeding How should I use this medicine? This medicine is for infusion into a vein. It is given by a health care professional in a hospital or clinic setting. Talk to your pediatrician regarding the use of this medicine in children. While this drug may be prescribed for children as young as 2 years for selected conditions, precautions do apply. Overdosage: If you think you have taken too much of this medicine contact a poison control center or emergency room at once. NOTE: This medicine is only for you. Do not share this medicine with others. What if I miss a dose? It is important not to miss your dose. Call your doctor or health care professional if you are unable to keep an appointment. What may interact with this medicine? Do not take this medicine with any of the following medications:  deferoxamine  dimercaprol  other iron products This medicine may also interact with the following medications:  chloramphenicol  deferasirox This list may not describe all possible interactions. Give your health care provider a list of all the medicines, herbs, non-prescription drugs, or dietary supplements you use. Also tell them if you smoke, drink alcohol, or use illegal drugs. Some items may interact with your medicine. What should I watch for while using this medicine? Visit your doctor or healthcare professional regularly. Tell your doctor or healthcare professional if your symptoms do not start to get better or if they get worse. You may need blood work done while you are taking this medicine. You may need to follow a special diet. Talk to your doctor. Foods that contain iron include: whole  grains/cereals, dried fruits, beans, or peas, leafy green vegetables, and organ meats (liver, kidney). What side effects may I notice from receiving this medicine? Side effects that you should report to your doctor or health care professional as soon as possible:  allergic reactions like  skin rash, itching or hives, swelling of the face, lips, or tongue  breathing problems  changes in blood pressure  cough  fast, irregular heartbeat  feeling faint or lightheaded, falls  fever or chills  flushing, sweating, or hot feelings  joint or muscle aches/pains  seizures  swelling of the ankles or feet  unusually weak or tired Side effects that usually do not require medical attention (report to your doctor or health care professional if they continue or are bothersome):  diarrhea  feeling achy  headache  irritation at site where injected  nausea, vomiting  stomach upset  tiredness This list may not describe all possible side effects. Call your doctor for medical advice about side effects. You may report side effects to FDA at 1-800-FDA-1088. Where should I keep my medicine? This drug is given in a hospital or clinic and will not be stored at home. NOTE: This sheet is a summary. It may not cover all possible information. If you have questions about this medicine, talk to your doctor, pharmacist, or health care provider.  2021 Elsevier/Gold Standard (2011-06-06 17:14:35)  

## 2020-09-26 NOTE — Progress Notes (Signed)
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   Patient reports increased acid reflux. He has been prescribed Carafate and Magic mouthwash. Current weight decreased and was documented as 188.75 pounds on January 18, decreased from 197.5 pounds on January 10. Patient is very concerned with weight loss. He reports poor appetite. He is swallowing okay but complains of nausea which is limiting oral intake. Eating soft solid foods is easier than liquids. Compazine helps him to eat however it makes him very sleepy. He would like to avoid a feeding tube if possible.  Nutrition diagnosis: Inadequate oral intake continues.  Intervention: Reviewed strategies for increasing oral intake and small frequent meals and snacks. Encourage patient to concentrate on soft high-protein foods.  Provided fact sheet. Reviewed high-calorie snacks. Recommended continue medications as needed per MD order. Brief strategies provided to improve acid reflux. Provided support and encouragement for weight maintenance/weight gain. Patient has RD contact information.  Monitoring, evaluation, goals: Patient will tolerate increased calories and protein to promote weight gain/weight stabilization.  Next visit: Monday, January 24 during infusion.  **Disclaimer: This note was dictated with voice recognition software. Similar sounding words can inadvertently be transcribed and this note may contain transcription errors which may not have been corrected upon publication of note.**

## 2020-09-26 NOTE — Progress Notes (Signed)
Per Dr. Irene Limbo ok to treat with today's labs.  Pt declined to stay for post iron 30 minute wait.

## 2020-09-27 ENCOUNTER — Ambulatory Visit
Admission: RE | Admit: 2020-09-27 | Discharge: 2020-09-27 | Disposition: A | Discharge status: Home / Self Care | Payer: No Typology Code available for payment source | Source: Ambulatory Visit | Attending: Radiation Oncology | Admitting: Radiation Oncology

## 2020-09-27 ENCOUNTER — Other Ambulatory Visit: Payer: Self-pay

## 2020-09-27 DIAGNOSIS — Z5111 Encounter for antineoplastic chemotherapy: Secondary | ICD-10-CM | POA: Diagnosis not present

## 2020-09-27 LAB — FERRITIN: Ferritin: 1239 ng/mL — ABNORMAL HIGH (ref 24–336)

## 2020-09-28 ENCOUNTER — Ambulatory Visit
Admission: RE | Admit: 2020-09-28 | Discharge: 2020-09-28 | Disposition: A | Discharge status: Home / Self Care | Payer: No Typology Code available for payment source | Source: Ambulatory Visit | Attending: Radiation Oncology | Admitting: Radiation Oncology

## 2020-09-28 DIAGNOSIS — Z5111 Encounter for antineoplastic chemotherapy: Secondary | ICD-10-CM | POA: Diagnosis not present

## 2020-09-28 NOTE — Progress Notes (Signed)
This patient was seen in the infusion room today as he was receiving carboplatin and paclitaxel.  He reports that he has a diffuse abdominal rash for which she has been using topical cortisone without relief.  A prescription for triamcinolone lotion and a prednisone taper was sent to his pharmacy.  Sandi Mealy, MHS, PA-C Physician Assistant

## 2020-09-29 ENCOUNTER — Other Ambulatory Visit: Payer: Self-pay

## 2020-09-29 ENCOUNTER — Ambulatory Visit
Admission: RE | Admit: 2020-09-29 | Discharge: 2020-09-29 | Disposition: A | Discharge status: Home / Self Care | Payer: No Typology Code available for payment source | Source: Ambulatory Visit | Attending: Radiation Oncology | Admitting: Radiation Oncology

## 2020-09-29 DIAGNOSIS — D500 Iron deficiency anemia secondary to blood loss (chronic): Secondary | ICD-10-CM

## 2020-09-29 DIAGNOSIS — Z5111 Encounter for antineoplastic chemotherapy: Secondary | ICD-10-CM | POA: Diagnosis not present

## 2020-10-01 NOTE — Progress Notes (Signed)
HEMATOLOGY/ONCOLOGY CLINIC NOTE  Date of Service: 10/01/2020  Patient Care Team: Berkley Harvey, NP as PCP - General (Nurse Practitioner) Brunetta Genera, MD as Consulting Physician (Hematology) Jonnie Finner, RN as Oncology Nurse Navigator  CHIEF COMPLAINTS/PURPOSE OF CONSULTATION:  GEJ Adenocarcinoma   HISTORY OF PRESENTING ILLNESS:  Donald Wade is a wonderful 65 y.o. 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 Pt is accompanied today by his wife, Donald Wade. The pt reports that he is doing well overall.  The pt reports that he feels better today than he has in a month. Pt was seen by Vascular Surgery for his Peripheral Artery Disease and was placed on Plavix and Cilostazol in September. Pt began having black, tarry stools about 1.5 months ago that he thought was medication-related. He became very lightheaded and dizzy prior to his his hospital admission on 08/21/2020. Pt was given 2 units of PRBC and IV Iron in the hospital. Pt was placed on Protonix twice per day and was taken off of Plavix and Cilostazol prior to discharge.   Pt denies any difficulty swallowing and is taking measures to eat small bites. He has had noticeable acid reflux in the last three months that has worsened recently.   He has Hypertension and Hyperlipidemia. He denies any history of COPD, Emphysema, Allergies, Diabetes, or ongoing heart issues. Pt is up to date with his Colonoscopies and has never had any Prostate issues.    Pt has had Upper Endoscopy completed on 08/22/2020 with results revealing "- Likely malignant esophageal tumor was found in the lower third of the esophagus and at the gastroesophageal junction. Biopsied. 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 - Normal gastric body, antrum and prepyloric region of the stomach. - Normal duodenal bulb, first portion of the duodenum, second portion of the duodenum and third  portion of the duodenum. - The examination was otherwise normal."  Of note prior to the patient's visit today, pt has had Esophageal/Stomach Mass Surgical Pathology [phone]) completed on 08/22/2020 with results revealing "A. ESOPHAGUS, DISTAL MASS, BIOPSY:  - Poorly differentiated adenocarcinoma with extracellular mucin and  signet ring cells. B. STOMACH, MASS, BIOPSY: - Poorly differentiated adenocarcinoma with extracellular mucin and  signet ring cells."   Pt has had CT C/A/P ([id number]) ([id number]) completed on 08/22/2020 with results revealing "1. Thickened appearance of the wall of the stomach adjacent to the gastroesophageal junction as well as proximal gastric wall concerning for infiltrative neoplasm. 2. Rounded gastrohepatic lymph nodes as well as mildly enlarged retroperitoneal lymph nodes concerning for early nodal metastatic disease. 3. No evidence of metastatic disease in the chest. 4. Colonic diverticulosis. 5. Aortic Atherosclerosis."  Most recent lab results (08/23/2020) of CBC is as follows: all values are WNL except for WBC at 3.9K, RBC at 2.93, Hgb at 7.6, HCT at 25.1, MCH at 25.9, RDW at 16.4, Glucose at 101, Calcium at 8.4, Total Protein at 5.8, Albumin at 3.2.  On review of systems, pt denies dysphagia, SOB, abdominal pain, dysuria, back pain, leg swelling and any other symptoms.   On PMHx the pt reports PAD, HTN, HLD, Appendectomy, Left Heart Cath. On Social Hx the pt reports that he is a non-smoker. He drinks 3-4 beers per day on average. Pt denies any other recreational drug use.  On Family Hx the pt reports that his mother had Colon Cancer in her mid-60's and his father had Throat Cancer after an extensive  history of cigar smoking.   INTERVAL HISTORY  Mr Moschella was seen in follow-up for toxicity check and clinical evaluation prior to his C4 cycle of concurrent carboplatin/Taxol with radiation. The patient's last visit with [place] was on 09/18/2020. The pt reports  that he is doing well overall.  The pt reports that he has been doing well, but has a severely decreased appetite. There is a lump in the back of his throat, with reflux and mucus occurring still. The pt notes that he has stopped drinking beer. He notes it is easier eating than drinking in reference to discomfort level. The pt notes that the food has not been getting stuck or any problems with choking.   The pt notes that yesterday he experienced loose stools. This was a one time occurrence, as he notes no other issues with bowel movements.   The pt notes no concerns related to radiation. He claims discomfort, but no pain or medications needed. Pt is tolerating it well.  The pt notes that he has a rash over his stomach and back area one week ago, but this has since been resolved. Dr. Sandi Mealy prescribed him a topical cream that helped with this.   Lab results today 10/02/2020 of CBC w/diff and CMP is as follows: all values are WNL except for WBC at 2.3K, Hgb at 11.3, HCT at 33.9, RDW at 20.5, Plt at 106K, Lymph Abs at 0.0K, Sodium at 131, Glucose at 115, Calcium at 8.8, Albumin at 3.3, Alkaline Phosphatase at 130. 10/02/2020 Ferritin is in progress.  On review of systems, pt reports intermittent diarrhea and nausea and denies black/bloody stools, vomiting, abdominal pain, back pain and any other symptoms.   MEDICAL HISTORY:  Past Medical History:  Diagnosis Date  . Anemia 08/22/2020  . Heart murmur   . Hyperlipidemia   . Hypertension     SURGICAL HISTORY: Past Surgical History:  Procedure Laterality Date  . APPENDECTOMY  1980  . BIOPSY  08/22/2020   Procedure: BIOPSY;  Surgeon: Clarene Essex, MD;  Location: [facility] ENDOSCOPY;  Service: Endoscopy;;  . ESOPHAGOGASTRODUODENOSCOPY  08/22/2020  . ESOPHAGOGASTRODUODENOSCOPY N/A 08/22/2020   Procedure: ESOPHAGOGASTRODUODENOSCOPY (EGD);  Surgeon: Clarene Essex, MD;  Location: [facility];  Service: Endoscopy;  Laterality: N/A;  . IR IMAGING  GUIDED PORT INSERTION  09/07/2020  . LEFT HEART CATH AND CORONARY ANGIOGRAPHY N/A 11/23/2019   Procedure: LEFT HEART CATH AND CORONARY ANGIOGRAPHY;  Surgeon: Nigel Mormon, MD;  Location: [facility];  Service: Cardiovascular;  Laterality: N/A;    SOCIAL HISTORY: Social History   Socioeconomic History  . Marital status: Married    Spouse name: Not on file  . Number of children: 2  . Years of education: Not on file  . Highest education level: Not on file  Occupational History  . Not on file  Tobacco Use  . Smoking status: Never Smoker  . Smokeless tobacco: Never Used  Vaping Use  . Vaping Use: Never used  Substance and Sexual Activity  . Alcohol use: Yes    Comment: 2 beers daily  . Drug use: Never  . Sexual activity: Not on file  Other Topics Concern  . Not on file  Social History Narrative  . Not on file   Social Determinants of Health   Financial Resource Strain: Not on file  Food Insecurity: Not on file  Transportation Needs: Not on file  Physical Activity: Not on file  Stress: Not on file  Social Connections: Not on  file  Intimate Partner Violence: Not on file    FAMILY HISTORY: Family History  Problem Relation Age of Onset  . Heart disease Mother   . Atrial fibrillation Mother     ALLERGIES:  is allergic to bee venom and penicillin g.  MEDICATIONS:  Current Outpatient Medications  Medication Sig Dispense Refill  . acetaminophen (TYLENOL) 325 MG tablet Take 650 mg by mouth every 6 (six) hours as needed for mild pain.    Marland Kitchen amLODipine (NORVASC) 5 MG tablet Take 1 tablet (5 mg total) by mouth daily. 30 tablet 0  . atorvastatin (LIPITOR) 40 MG tablet Take 1 tablet (40 mg total) by mouth daily. 90 tablet 3  . buPROPion (WELLBUTRIN XL) 150 MG 24 hr tablet Take 150 mg by mouth daily.     Marland Kitchen dexamethasone (DECADRON) 4 MG tablet Take 2 tablets (8 mg total) by mouth daily. Start the day after chemotherapy for 2 days. 30 tablet 1  . EPINEPHrine 0.3  mg/0.3 mL IJ SOAJ injection Inject 0.3 mg into the skin as needed for anaphylaxis (call 911).    . feeding supplement (ENSURE ENLIVE / ENSURE PLUS) LIQD Take 237 mLs by mouth 2 (two) times daily between meals. 237 mL 12  . fexofenadine (ALLEGRA) 180 MG tablet Take 180 mg by mouth daily as needed for allergies.    Marland Kitchen lidocaine-prilocaine (EMLA) cream Apply to affected area once 30 g 3  . LORazepam (ATIVAN) 0.5 MG tablet Take 1 tablet (0.5 mg total) by mouth every 6 (six) hours as needed (Nausea or vomiting). 30 tablet 0  . magic mouthwash w/lidocaine SOLN 1 Part viscous lidocaine 2% 1 Part Maalox 1 Part diphenhydramine 12.5 mg per 5 ml elixir  Use 40ml swish and swallow four times a day as needed for throat or esophageal pain from radiation. 200 mL 1  . metoprolol succinate (TOPROL XL) 50 MG 24 hr tablet Take 1 tablet (50 mg total) by mouth daily. (Patient taking differently: Take 25 mg by mouth daily.) 90 tablet 2  . Multiple Vitamin (MULTI-VITAMIN) tablet Take 1 tablet by mouth daily.    . Omega-3 1000 MG CAPS Take 1,000 mg by mouth once a week.    . ondansetron (ZOFRAN) 8 MG tablet Take 1 tablet (8 mg total) by mouth 2 (two) times daily as needed for refractory nausea / vomiting. Start on day 3 after chemo. 30 tablet 1  . pantoprazole (PROTONIX) 40 MG tablet Take 1 tablet (40 mg total) by mouth 2 (two) times daily. 60 tablet 2  . polyethylene glycol (MIRALAX / GLYCOLAX) 17 g packet Take 17 g by mouth daily as needed for mild constipation. 14 each 0  . predniSONE (DELTASONE) 5 MG tablet 6 tab x 1 day, 5 tab x 1 day, 4 tab x 1 day, 3 tab x 1 day, 2 tab x 1 day, 1 tab x 1 day, stop 21 tablet 0  . prochlorperazine (COMPAZINE) 10 MG tablet Take 1 tablet (10 mg total) by mouth every 6 (six) hours as needed (Nausea or vomiting). 30 tablet 1  . sucralfate (CARAFATE) 1 GM/10ML suspension Take 10 mLs (1 g total) by mouth 2 (two) times daily. 420 mL 1  . triamcinolone lotion (KENALOG) 0.1 % Apply 1  application topically 3 (three) times daily. 120 mL 3  . vitamin B-12 (CYANOCOBALAMIN) 1000 MCG tablet Take 1,000 mcg by mouth daily.     No current facility-administered medications for this visit.    REVIEW OF SYSTEMS:  10 Point review of Systems was done is negative except as noted above.   PHYSICAL EXAMINATION: ECOG PERFORMANCE STATUS: 1 - Symptomatic but completely ambulatory  .BP 100/62 (BP Location: Left Arm, Patient Position: Sitting)   Pulse 80   Temp 98.1 F (36.7 C) (Tympanic)   Resp 20   Ht 6' (1.829 m)   Wt 183 lb 6.4 oz (83.2 kg)   SpO2 97%   BMI 24.87 kg/m   GENERAL:alert, in no acute distress and comfortable SKIN: no acute rashes, no significant lesions EYES: conjunctiva are pink and non-injected, sclera anicteric OROPHARYNX: MMM, no exudates, no oropharyngeal erythema or ulceration NECK: supple, no JVD LYMPH:  no palpable lymphadenopathy in the cervical, axillary or inguinal regions LUNGS: clear to auscultation b/l with normal respiratory effort HEART: regular rate & rhythm ABDOMEN:  normoactive bowel sounds , non tender, not distended. Extremity: no pedal edema PSYCH: alert & oriented x 3 with fluent speech NEURO: no focal motor/sensory deficits  LABORATORY DATA:  I have reviewed the data as listed  CBC Latest Ref Rng & Units 09/26/2020 09/18/2020 09/11/2020  WBC 4.0 - 10.5 K/uL 2.8(L) 7.3 4.1  Hemoglobin 13.0 - 17.0 g/dL 10.6(L) 9.9(L) 10.4(L)  Hematocrit 39.0 - 52.0 % 32.5(L) 30.5(L) 32.8(L)  Platelets 150 - 400 K/uL 132(L) 216 277   ANC 2000 . CMP Latest Ref Rng & Units 09/26/2020 09/18/2020 09/11/2020  Glucose 70 - 99 mg/dL 102(H) 125(H) 99  BUN 8 - 23 mg/dL 11 11 8   Creatinine 0.61 - 1.24 mg/dL 0.54(L) 0.67 0.77  Sodium 135 - 145 mmol/L 132(L) 132(L) 136  Potassium 3.5 - 5.1 mmol/L 3.9 4.3 4.2  Chloride 98 - 111 mmol/L 99 100 102  CO2 22 - 32 mmol/L 27 23 27   Calcium 8.9 - 10.3 mg/dL 8.7(L) 8.9 9.0  Total Protein 6.5 - 8.1 g/dL 7.0 6.6 7.1   Total Bilirubin 0.3 - 1.2 mg/dL 0.9 1.1 0.9  Alkaline Phos 38 - 126 U/L 222(H) 122 105  AST 15 - 41 U/L 53(H) 26 23  ALT 0 - 44 U/L 83(H) 30 20    08/22/2020 Esophageal/Stomach Mass Surgical Pathology [phone]):   RADIOGRAPHIC STUDIES: I have personally reviewed the radiological images as listed and agreed with the findings in the report. IR IMAGING GUIDED PORT INSERTION  Result Date: 09/07/2020 CLINICAL DATA:  Carcinoma of the distal esophagus and gastroesophageal junction and need for porta cath to begin chemotherapy. EXAM: IMPLANTED PORT A CATH PLACEMENT WITH ULTRASOUND AND FLUOROSCOPIC GUIDANCE ANESTHESIA/SEDATION: 2.5 mg IV Versed; 100 mcg IV Fentanyl Total Moderate Sedation Time:  43 minutes The patient's level of consciousness and physiologic status were continuously monitored during the procedure by Radiology nursing. Additional Medications: 900 mg IV clindamycin. FLUOROSCOPY TIME:  24 seconds.  11.0 mGy. PROCEDURE: The procedure, risks, benefits, and alternatives were explained to the patient. Questions regarding the procedure were encouraged and answered. The patient understands and consents to the procedure. A time-out was performed prior to initiating the procedure. Ultrasound was utilized to confirm patency of the right internal jugular vein. The right neck and chest were prepped with chlorhexidine in a sterile fashion, and a sterile drape was applied covering the operative field. Maximum barrier sterile technique with sterile gowns and gloves were used for the procedure. Local anesthesia was provided with 1% lidocaine. After creating a small venotomy incision, a 21 gauge needle was advanced into the right internal jugular vein under direct, real-time ultrasound guidance. Ultrasound image documentation was performed. After securing guidewire access,  an 8 Fr dilator was placed. A J-wire was kinked to measure appropriate catheter length. A subcutaneous port pocket was then created  along the upper chest wall utilizing sharp and blunt dissection. Portable cautery was utilized. The pocket was irrigated with sterile saline. A single lumen power injectable port was chosen for placement. The 8 Fr catheter was tunneled from the port pocket site to the venotomy incision. The port was placed in the pocket. External catheter was trimmed to appropriate length based on guidewire measurement. At the venotomy, an 8 Fr peel-away sheath was placed over a guidewire. The catheter was then placed through the sheath and the sheath removed. Final catheter positioning was confirmed and documented with a fluoroscopic spot image. The port was accessed with a needle and aspirated and flushed with heparinized saline. The access needle was removed. The venotomy and port pocket incisions were closed with subcutaneous 3-0 Monocryl and subcuticular 4-0 Vicryl. Dermabond was applied to both incisions. COMPLICATIONS: COMPLICATIONS None FINDINGS: After catheter placement, the tip lies at the cavo-atrial junction. The catheter aspirates normally and is ready for immediate use. IMPRESSION: Placement of single lumen port a cath via right internal jugular vein. The catheter tip lies at the cavo-atrial junction. A power injectable port a cath was placed and is ready for immediate use. Electronically Signed   By: Aletta Edouard M.D.   On: 09/07/2020 10:46    ASSESSMENT & PLAN:   65 yo male with no significant smoking or tobacco use h/o but with chronic reflux and etoh use with   1) Newly diagnosed GE junction poorly differentiated with extracellular mucin and signet ring cells. [id number] No overt obstructive symptoms at this time Esophageal/Stomach Mass Surgical Pathology [phone]) which revealed "A. ESOPHAGUS, DISTAL MASS, BIOPSY:  - Poorly differentiated adenocarcinoma with extracellular mucin and  signet ring cells. B. STOMACH, MASS, BIOPSY: - Poorly differentiated adenocarcinoma with extracellular mucin and  signet ring cells."  PET/CT 08/31/2020- Wall thickening/mass centered in the gastric cardia, compatible with the patient's known primary neoplasm. This predominantly involves the proximal stomach rather than the distal esophagus. Small gastrohepatic and para-aortic nodes, suspicious for nodal metastases  2) s/p GI bleeding- melena with symptomatic anemia s/p PRBC transfusion.  No further evidence of GI bleeding at this time. 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 WBC borderline low but not Neutropenic. -Advised pt we cannot use growth hormone to stimulate WBC while on radiation. Will continue to monitor levels prior to next treatment. If drop further, we may have to hold next treatment. Will continue as normal today at this time.  -Discussed increasing pt's caloric intake to combat weight loss. Replace some water with liquid calories such as Boost or Insure mixed with ice cream to add calories. Blend fruits or freeze them to aid mouth discomfort and dryness. -Advise pt to choose the high calorie, high protein Boost/Insure, not low calorie, high protein option. -Recommend pt increase electrolyte intake. He can increase salt intake at this time. -Advise pt we will continue to monitor weight and avoid feeding tube at this time. -Will stop Amlodipine.  -Advise pt to discuss Metoprolol decrease from 50mg  to 25mg  with PCP. Will f/u with this. -Discussed Rx Marinol to increase appetite. The pt desires to start this. Will Rx Marinol. -Advise pt to moisturize skin due to enflamed hair follicles that led  to rashes. Use loose clothes at home and avoid long, hot showers. Pat dry after shower then moisturize. -Continue IV iron as per plan. -Recommend pt f/u with Nutritionist on diet. -We will start patient on  sucralfate and will have Magic mouthwash available as needed to address radiation related esophagitis/gastritis. -Will see back with C5 and labs.   FOLLOW UP: Plz schedule C5 and C6 of weekly Carbo/taxol as per ordered with labs and MD visits with each treatment. (can see in infusion)   All of the patients questions were answered with apparent satisfaction. The patient knows to call the clinic with any problems, questions or concerns.   The total time spent in the appointment was 40 minutes and more than 50% was on counseling and direct patient cares.   Sullivan Lone MD [place] AAHIVMS [facility] Hematology/Oncology Physician [facility]  (Office):       [phone] (Work cell):  [phone] (Fax):           [phone]   I, Reinaldo Raddle, am acting as scribe for Dr. Sullivan Lone, MD.   .I have reviewed the above documentation for accuracy and completeness, and I agree with the above. Brunetta Genera MD

## 2020-10-02 ENCOUNTER — Inpatient Hospital Stay (HOSPITAL_BASED_OUTPATIENT_CLINIC_OR_DEPARTMENT_OTHER): Payer: No Typology Code available for payment source | Admitting: Hematology

## 2020-10-02 ENCOUNTER — Other Ambulatory Visit: Payer: Self-pay

## 2020-10-02 ENCOUNTER — Inpatient Hospital Stay: Payer: No Typology Code available for payment source

## 2020-10-02 ENCOUNTER — Ambulatory Visit
Admission: RE | Admit: 2020-10-02 | Discharge: 2020-10-02 | Disposition: A | Discharge status: Home / Self Care | Payer: No Typology Code available for payment source | Source: Ambulatory Visit | Attending: Radiation Oncology | Admitting: Radiation Oncology

## 2020-10-02 ENCOUNTER — Inpatient Hospital Stay: Payer: No Typology Code available for payment source | Admitting: Nutrition

## 2020-10-02 ENCOUNTER — Other Ambulatory Visit: Payer: No Typology Code available for payment source

## 2020-10-02 ENCOUNTER — Ambulatory Visit: Payer: No Typology Code available for payment source

## 2020-10-02 VITALS — BP 116/63 | HR 82 | Temp 97.5°F | Resp 18

## 2020-10-02 VITALS — BP 100/62 | HR 80 | Temp 98.1°F | Resp 20 | Ht 72.0 in | Wt 183.4 lb

## 2020-10-02 DIAGNOSIS — C158 Malignant neoplasm of overlapping sites of esophagus: Secondary | ICD-10-CM | POA: Diagnosis not present

## 2020-10-02 DIAGNOSIS — Z5111 Encounter for antineoplastic chemotherapy: Secondary | ICD-10-CM | POA: Diagnosis not present

## 2020-10-02 DIAGNOSIS — D500 Iron deficiency anemia secondary to blood loss (chronic): Secondary | ICD-10-CM

## 2020-10-02 DIAGNOSIS — R630 Anorexia: Secondary | ICD-10-CM | POA: Diagnosis not present

## 2020-10-02 DIAGNOSIS — K922 Gastrointestinal hemorrhage, unspecified: Secondary | ICD-10-CM

## 2020-10-02 DIAGNOSIS — Z95828 Presence of other vascular implants and grafts: Secondary | ICD-10-CM

## 2020-10-02 LAB — CBC WITH DIFFERENTIAL (CANCER CENTER ONLY)
Abs Immature Granulocytes: 0.02 10*3/uL (ref 0.00–0.07)
Basophils Absolute: 0 10*3/uL (ref 0.0–0.1)
Basophils Relative: 1 %
Eosinophils Absolute: 0.1 10*3/uL (ref 0.0–0.5)
Eosinophils Relative: 3 %
HCT: 33.9 % — ABNORMAL LOW (ref 39.0–52.0)
Hemoglobin: 11.3 g/dL — ABNORMAL LOW (ref 13.0–17.0)
Immature Granulocytes: 1 %
Lymphocytes Relative: 1 %
Lymphs Abs: 0 10*3/uL — ABNORMAL LOW (ref 0.7–4.0)
MCH: 26.8 pg (ref 26.0–34.0)
MCHC: 33.3 g/dL (ref 30.0–36.0)
MCV: 80.3 fL (ref 80.0–100.0)
Monocytes Absolute: 0.2 10*3/uL (ref 0.1–1.0)
Monocytes Relative: 8 %
Neutro Abs: 2 10*3/uL (ref 1.7–7.7)
Neutrophils Relative %: 86 %
Platelet Count: 106 10*3/uL — ABNORMAL LOW (ref 150–400)
RBC: 4.22 MIL/uL (ref 4.22–5.81)
RDW: 20.5 % — ABNORMAL HIGH (ref 11.5–15.5)
WBC Count: 2.3 10*3/uL — ABNORMAL LOW (ref 4.0–10.5)
nRBC: 0 % (ref 0.0–0.2)

## 2020-10-02 LAB — CMP (CANCER CENTER ONLY)
ALT: 23 U/L (ref 0–44)
AST: 20 U/L (ref 15–41)
Albumin: 3.3 g/dL — ABNORMAL LOW (ref 3.5–5.0)
Alkaline Phosphatase: 130 U/L — ABNORMAL HIGH (ref 38–126)
Anion gap: 8 (ref 5–15)
BUN: 10 mg/dL (ref 8–23)
CO2: 24 mmol/L (ref 22–32)
Calcium: 8.8 mg/dL — ABNORMAL LOW (ref 8.9–10.3)
Chloride: 99 mmol/L (ref 98–111)
Creatinine: 0.68 mg/dL (ref 0.61–1.24)
GFR, Estimated: >60 mL/min (ref >60)
Glucose, Bld: 115 mg/dL — ABNORMAL HIGH (ref 70–99)
Potassium: 3.7 mmol/L (ref 3.5–5.1)
Sodium: 131 mmol/L — ABNORMAL LOW (ref 135–145)
Total Bilirubin: 0.6 mg/dL (ref 0.3–1.2)
Total Protein: 6.6 g/dL (ref 6.5–8.1)

## 2020-10-02 LAB — FERRITIN: Ferritin: 1849 ng/mL — ABNORMAL HIGH (ref 24–336)

## 2020-10-02 MED ORDER — SODIUM CHLORIDE 0.9% FLUSH
10.0000 mL | INTRAVENOUS | Status: DC | PRN
  Administered 2020-10-02: 10 mL via INTRAVENOUS
  Filled 2020-10-02: qty 10

## 2020-10-02 MED ORDER — SODIUM CHLORIDE 0.9% FLUSH
10.0000 mL | INTRAVENOUS | Status: DC | PRN
Start: 2020-10-02 — End: 2020-10-02
  Administered 2020-10-02: 10 mL
  Filled 2020-10-02: qty 10

## 2020-10-02 MED ORDER — FAMOTIDINE IN NACL 20-0.9 MG/50ML-% IV SOLN
INTRAVENOUS | Status: AC
  Filled 2020-10-02: qty 50

## 2020-10-02 MED ORDER — DRONABINOL 2.5 MG PO CAPS: 2.5000 mg | ORAL_CAPSULE | Freq: Two times a day (BID) | ORAL | 0 refills | Status: DC

## 2020-10-02 MED ORDER — SODIUM CHLORIDE 0.9 % IV SOLN
Freq: Once | INTRAVENOUS | Status: AC
  Filled 2020-10-02: qty 250

## 2020-10-02 MED ORDER — DIPHENHYDRAMINE HCL 50 MG/ML IJ SOLN: 50.0000 mg | Freq: Once | INTRAMUSCULAR | Status: DC | PRN

## 2020-10-02 MED ORDER — FAMOTIDINE IN NACL 20-0.9 MG/50ML-% IV SOLN: 20.0000 mg | Freq: Once | INTRAVENOUS | Status: DC | PRN

## 2020-10-02 MED ORDER — CARBOPLATIN CHEMO INJECTION 450 MG/45ML
298.8000 mg | Freq: Once | INTRAVENOUS | Status: AC
  Administered 2020-10-02: 300 mg via INTRAVENOUS
  Filled 2020-10-02: qty 30

## 2020-10-02 MED ORDER — SODIUM CHLORIDE 0.9 % IV SOLN
200.0000 mg | Freq: Once | INTRAVENOUS | Status: AC
  Administered 2020-10-02: 200 mg via INTRAVENOUS
  Filled 2020-10-02: qty 200

## 2020-10-02 MED ORDER — PALONOSETRON HCL INJECTION 0.25 MG/5ML
0.2500 mg | Freq: Once | INTRAVENOUS | Status: AC
  Administered 2020-10-02: 0.25 mg via INTRAVENOUS

## 2020-10-02 MED ORDER — DIPHENHYDRAMINE HCL 50 MG/ML IJ SOLN
50.0000 mg | Freq: Once | INTRAMUSCULAR | Status: AC
  Administered 2020-10-02: 50 mg via INTRAVENOUS

## 2020-10-02 MED ORDER — DIPHENHYDRAMINE HCL 50 MG/ML IJ SOLN
INTRAMUSCULAR | Status: AC
  Filled 2020-10-02: qty 1

## 2020-10-02 MED ORDER — PALONOSETRON HCL INJECTION 0.25 MG/5ML
INTRAVENOUS | Status: AC
  Filled 2020-10-02: qty 5

## 2020-10-02 MED ORDER — SODIUM CHLORIDE 0.9 % IV SOLN
20.0000 mg | Freq: Once | INTRAVENOUS | Status: AC
  Administered 2020-10-02: 20 mg via INTRAVENOUS
  Filled 2020-10-02: qty 20

## 2020-10-02 MED ORDER — SODIUM CHLORIDE 0.9 % IV SOLN
50.0000 mg/m2 | Freq: Once | INTRAVENOUS | Status: AC
  Administered 2020-10-02: 108 mg via INTRAVENOUS
  Filled 2020-10-02: qty 18

## 2020-10-02 MED ORDER — HEPARIN SOD (PORK) LOCK FLUSH 100 UNIT/ML IV SOLN
500.0000 [IU] | Freq: Once | INTRAVENOUS | Status: AC | PRN
  Administered 2020-10-02: 500 [IU]
  Filled 2020-10-02: qty 5

## 2020-10-02 MED ORDER — FAMOTIDINE IN NACL 20-0.9 MG/50ML-% IV SOLN
20.0000 mg | Freq: Once | INTRAVENOUS | Status: AC
  Administered 2020-10-02: 20 mg via INTRAVENOUS

## 2020-10-02 NOTE — Patient Instructions (Signed)
[facility] Discharge Instructions for Patients Receiving Chemotherapy  Today you received the following chemotherapy agents Paclitaxel (Taxol) and Carboplatin (Paraplatin).  To help prevent nausea and vomiting after your treatment, we encourage you to take your nausea medication as directed.   If you develop nausea and vomiting that is not controlled by your nausea medication, call the clinic.   BELOW ARE SYMPTOMS THAT SHOULD BE REPORTED IMMEDIATELY:  *FEVER GREATER THAN 100.5 F  *CHILLS WITH OR WITHOUT FEVER  NAUSEA AND VOMITING THAT IS NOT CONTROLLED WITH YOUR NAUSEA MEDICATION  *UNUSUAL SHORTNESS OF BREATH  *UNUSUAL BRUISING OR BLEEDING  TENDERNESS IN MOUTH AND THROAT WITH OR WITHOUT PRESENCE OF ULCERS  *URINARY PROBLEMS  *BOWEL PROBLEMS  UNUSUAL RASH Items with * indicate a potential emergency and should be followed up as soon as possible.  Feel free to call the clinic should you have any questions or concerns. The clinic phone number is [phone].  Please show the [facility] at check-in to the Emergency Department and triage nurse.  Iron Sucrose injection What is this medicine? IRON SUCROSE (AHY ern SOO krohs) is an iron complex. Iron is used to make healthy red blood cells, which carry oxygen and nutrients throughout the body. This medicine is used to treat iron deficiency anemia in people with chronic kidney disease. This medicine may be used for other purposes; ask your health care provider or pharmacist if you have questions. COMMON BRAND NAME(S): Venofer What should I tell my health care provider before I take this medicine? They need to know if you have any of these conditions:  anemia not caused by low iron levels  heart disease  high levels of iron in the blood  kidney disease  liver disease  an unusual or allergic reaction to iron, other medicines, foods, dyes, or preservatives  pregnant or trying to get  pregnant  breast-feeding How should I use this medicine? This medicine is for infusion into a vein. It is given by a health care professional in a hospital or clinic setting. Talk to your pediatrician regarding the use of this medicine in children. While this drug may be prescribed for children as young as 2 years for selected conditions, precautions do apply. Overdosage: If you think you have taken too much of this medicine contact a poison control center or emergency room at once. NOTE: This medicine is only for you. Do not share this medicine with others. What if I miss a dose? It is important not to miss your dose. Call your doctor or health care professional if you are unable to keep an appointment. What may interact with this medicine? Do not take this medicine with any of the following medications:  deferoxamine  dimercaprol  other iron products This medicine may also interact with the following medications:  chloramphenicol  deferasirox This list may not describe all possible interactions. Give your health care provider a list of all the medicines, herbs, non-prescription drugs, or dietary supplements you use. Also tell them if you smoke, drink alcohol, or use illegal drugs. Some items may interact with your medicine. What should I watch for while using this medicine? Visit your doctor or healthcare professional regularly. Tell your doctor or healthcare professional if your symptoms do not start to get better or if they get worse. You may need blood work done while you are taking this medicine. You may need to follow a special diet. Talk to your doctor. Foods that contain iron include: whole  grains/cereals, dried fruits, beans, or peas, leafy green vegetables, and organ meats (liver, kidney). What side effects may I notice from receiving this medicine? Side effects that you should report to your doctor or health care professional as soon as possible:  allergic reactions like  skin rash, itching or hives, swelling of the face, lips, or tongue  breathing problems  changes in blood pressure  cough  fast, irregular heartbeat  feeling faint or lightheaded, falls  fever or chills  flushing, sweating, or hot feelings  joint or muscle aches/pains  seizures  swelling of the ankles or feet  unusually weak or tired Side effects that usually do not require medical attention (report to your doctor or health care professional if they continue or are bothersome):  diarrhea  feeling achy  headache  irritation at site where injected  nausea, vomiting  stomach upset  tiredness This list may not describe all possible side effects. Call your doctor for medical advice about side effects. You may report side effects to FDA at 1-800-FDA-1088. Where should I keep my medicine? This drug is given in a hospital or clinic and will not be stored at home. NOTE: This sheet is a summary. It may not cover all possible information. If you have questions about this medicine, talk to your doctor, pharmacist, or health care provider.  2021 Elsevier/Gold Standard (2011-06-06 17:14:35)

## 2020-10-02 NOTE — Patient Instructions (Signed)

## 2020-10-02 NOTE — Progress Notes (Signed)
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 Patient is discouraged because of his weight loss. States he ate very little this weekend. Reports he is going to have to force himself to eat more. Weight documented as 183.4 pounds today down from 188.75 pounds January 18. He understands MD may recommend feeding tube placement if oral intake not improved.  Estimated nutrition needs: 2200-2400 cal, 110-130 g protein, 2.2 L fluid  Nutrition diagnosis: Inadequate oral intake continues.  Intervention: Educated patient on specific ways he can increase calories and protein. Recommended Ensure complete or equivalent of 4 bottles daily to provide 1400 cal and 120 g protein. Recommended patient strategize different tastes and textures to increase intake. Provided support and encouragement.  Monitoring, evaluation, goals: Patient will work to increase calories and protein to minimize further weight loss.  Next visit: Monday January 31 during infusion.  **Disclaimer: This note was dictated with voice recognition software. Similar sounding words can inadvertently be transcribed and this note may contain transcription errors which may not have been corrected upon publication of note.**

## 2020-10-02 NOTE — Progress Notes (Signed)
Patient declined 30 min post observation for venofer.  Discharged in stable condition

## 2020-10-03 ENCOUNTER — Ambulatory Visit
Admission: RE | Admit: 2020-10-03 | Discharge: 2020-10-03 | Disposition: A | Discharge status: Home / Self Care | Payer: No Typology Code available for payment source | Source: Ambulatory Visit | Attending: Radiation Oncology | Admitting: Radiation Oncology

## 2020-10-03 ENCOUNTER — Telehealth: Payer: Self-pay | Admitting: *Deleted

## 2020-10-03 DIAGNOSIS — Z5111 Encounter for antineoplastic chemotherapy: Secondary | ICD-10-CM | POA: Diagnosis not present

## 2020-10-03 NOTE — Telephone Encounter (Signed)
Wife called - takes dexamethasone as premed and takes on days 1 and 2: not as much trouble swallowing and eating. After he finishes the 3 days, swallowing becomes painful/difficult and things get caught in his throat. She wondered if steroids were something he could take more often since they help. If not steroid - is there anything else that help? She also wants to know if he can take benadryl 25 mg at hs to help him sleep?  Dr. Irene Limbo informed of her concerns Per Dr. Irene Limbo: he has sucralfate and magic mouthwash- we discussed using this. He may take benadryl or lorazepam as needed at bedtime for sleep.  Contacted Ms. Gavilanes: Gave her information from Dr. Irene Limbo - she verbalized understanding.

## 2020-10-04 ENCOUNTER — Ambulatory Visit
Admission: RE | Admit: 2020-10-04 | Discharge: 2020-10-04 | Disposition: A | Discharge status: Home / Self Care | Payer: No Typology Code available for payment source | Source: Ambulatory Visit | Attending: Radiation Oncology | Admitting: Radiation Oncology

## 2020-10-04 DIAGNOSIS — Z5111 Encounter for antineoplastic chemotherapy: Secondary | ICD-10-CM | POA: Diagnosis not present

## 2020-10-05 ENCOUNTER — Ambulatory Visit
Admission: RE | Admit: 2020-10-05 | Discharge: 2020-10-05 | Disposition: A | Discharge status: Home / Self Care | Payer: No Typology Code available for payment source | Source: Ambulatory Visit | Attending: Radiation Oncology | Admitting: Radiation Oncology

## 2020-10-05 ENCOUNTER — Other Ambulatory Visit: Payer: Self-pay

## 2020-10-05 DIAGNOSIS — Z5111 Encounter for antineoplastic chemotherapy: Secondary | ICD-10-CM | POA: Diagnosis not present

## 2020-10-06 ENCOUNTER — Ambulatory Visit
Admission: RE | Admit: 2020-10-06 | Discharge: 2020-10-06 | Disposition: A | Discharge status: Home / Self Care | Payer: No Typology Code available for payment source | Source: Ambulatory Visit | Attending: Radiation Oncology | Admitting: Radiation Oncology

## 2020-10-06 DIAGNOSIS — Z5111 Encounter for antineoplastic chemotherapy: Secondary | ICD-10-CM | POA: Diagnosis not present

## 2020-10-08 ENCOUNTER — Encounter (HOSPITAL_COMMUNITY): Payer: Self-pay | Admitting: Emergency Medicine

## 2020-10-08 ENCOUNTER — Other Ambulatory Visit: Payer: Self-pay

## 2020-10-08 ENCOUNTER — Other Ambulatory Visit: Payer: Self-pay | Admitting: Hematology and Oncology

## 2020-10-08 ENCOUNTER — Emergency Department (HOSPITAL_COMMUNITY)
Admission: EM | Admit: 2020-10-08 | Discharge: 2020-10-08 | Disposition: A | Discharge status: Home / Self Care | Payer: No Typology Code available for payment source | Attending: Emergency Medicine | Admitting: Emergency Medicine

## 2020-10-08 DIAGNOSIS — I10 Essential (primary) hypertension: Secondary | ICD-10-CM | POA: Diagnosis not present

## 2020-10-08 DIAGNOSIS — Z79899 Other long term (current) drug therapy: Secondary | ICD-10-CM | POA: Diagnosis not present

## 2020-10-08 DIAGNOSIS — Z8501 Personal history of malignant neoplasm of esophagus: Secondary | ICD-10-CM | POA: Insufficient documentation

## 2020-10-08 DIAGNOSIS — R1310 Dysphagia, unspecified: Secondary | ICD-10-CM | POA: Diagnosis present

## 2020-10-08 DIAGNOSIS — Z5111 Encounter for antineoplastic chemotherapy: Secondary | ICD-10-CM | POA: Diagnosis not present

## 2020-10-08 DIAGNOSIS — D61818 Other pancytopenia: Secondary | ICD-10-CM | POA: Diagnosis not present

## 2020-10-08 DIAGNOSIS — K1230 Oral mucositis (ulcerative), unspecified: Secondary | ICD-10-CM | POA: Diagnosis not present

## 2020-10-08 LAB — CBC WITH DIFFERENTIAL/PLATELET
Abs Immature Granulocytes: 0 10*3/uL (ref 0.00–0.07)
Basophils Absolute: 0 10*3/uL (ref 0.0–0.1)
Basophils Relative: 1 %
Eosinophils Absolute: 0 10*3/uL (ref 0.0–0.5)
Eosinophils Relative: 1 %
HCT: 33.4 % — ABNORMAL LOW (ref 39.0–52.0)
Hemoglobin: 11 g/dL — ABNORMAL LOW (ref 13.0–17.0)
Immature Granulocytes: 0 %
Lymphocytes Relative: 2 %
Lymphs Abs: 0 10*3/uL — ABNORMAL LOW (ref 0.7–4.0)
MCH: 26.9 pg (ref 26.0–34.0)
MCHC: 32.9 g/dL (ref 30.0–36.0)
MCV: 81.7 fL (ref 80.0–100.0)
Monocytes Absolute: 0.1 10*3/uL (ref 0.1–1.0)
Monocytes Relative: 10 %
Neutro Abs: 0.8 10*3/uL — ABNORMAL LOW (ref 1.7–7.7)
Neutrophils Relative %: 86 %
Platelets: 78 10*3/uL — ABNORMAL LOW (ref 150–400)
RBC: 4.09 MIL/uL — ABNORMAL LOW (ref 4.22–5.81)
RDW: 20.6 % — ABNORMAL HIGH (ref 11.5–15.5)
WBC: 0.9 10*3/uL — CL (ref 4.0–10.5)
nRBC: 0 % (ref 0.0–0.2)

## 2020-10-08 LAB — COMPREHENSIVE METABOLIC PANEL
ALT: 169 U/L — ABNORMAL HIGH (ref 0–44)
AST: 154 U/L — ABNORMAL HIGH (ref 15–41)
Albumin: 3.2 g/dL — ABNORMAL LOW (ref 3.5–5.0)
Alkaline Phosphatase: 361 U/L — ABNORMAL HIGH (ref 38–126)
Anion gap: 11 (ref 5–15)
BUN: 12 mg/dL (ref 8–23)
CO2: 24 mmol/L (ref 22–32)
Calcium: 8.3 mg/dL — ABNORMAL LOW (ref 8.9–10.3)
Chloride: 97 mmol/L — ABNORMAL LOW (ref 98–111)
Creatinine, Ser: 0.55 mg/dL — ABNORMAL LOW (ref 0.61–1.24)
GFR, Estimated: >60 mL/min (ref >60)
Glucose, Bld: 120 mg/dL — ABNORMAL HIGH (ref 70–99)
Potassium: 3.3 mmol/L — ABNORMAL LOW (ref 3.5–5.1)
Sodium: 132 mmol/L — ABNORMAL LOW (ref 135–145)
Total Bilirubin: 0.7 mg/dL (ref 0.3–1.2)
Total Protein: 6.2 g/dL — ABNORMAL LOW (ref 6.5–8.1)

## 2020-10-08 MED ORDER — HYDROMORPHONE HCL 2 MG PO TABS: 2.0000 mg | ORAL_TABLET | Freq: Four times a day (QID) | ORAL | 0 refills | Status: DC | PRN

## 2020-10-08 MED ORDER — LACTATED RINGERS IV BOLUS
2000.0000 mL | Freq: Once | INTRAVENOUS | Status: AC
  Administered 2020-10-08: 2000 mL via INTRAVENOUS

## 2020-10-08 MED ORDER — HYDROMORPHONE HCL 1 MG/ML IJ SOLN
1.0000 mg | Freq: Once | INTRAMUSCULAR | Status: AC
  Administered 2020-10-08: 1 mg via INTRAVENOUS
  Filled 2020-10-08: qty 1

## 2020-10-08 MED ORDER — HYDROMORPHONE HCL 1 MG/ML PO LIQD: 2.0000 mg | ORAL | 0 refills | Status: DC | PRN

## 2020-10-08 NOTE — Progress Notes (Signed)
I got a phone call from [facility] nurse that the liquid hydromorphone is not available at the pharmacy and if this can be switched to oral hydromorphone. Prescription switched to oral hydromorphone.

## 2020-10-08 NOTE — Discharge Instructions (Addendum)
Make sure to drink plenty of fluids.  Medications as needed for pain.  Follow-up with your oncology doctor tomorrow as planned

## 2020-10-08 NOTE — ED Provider Notes (Signed)
[facility] DEPT Provider Note   CSN: [id number] Arrival date & time: 10/08/20  1005     History No chief complaint on file.   Donald Wade is a 65 y.o. male.  HPI   Patient has a history of esophageal cancer.  Patient has been receiving chemotherapy and radiation treatments.  Patient's last chemo infusion was on January 24.  Patient was treated with carboplatinum and Taxol.  His last radiation treatment was on January 28.  Patient states over the last several days he has had worsening symptoms.  He has not been able to eat or drink.  He feels a lump in his throat and it is painful when he tries to swallow.  He does feel increased mucus production but is not having any difficulty handling his secretions.  Patient denies any fevers.  He is feeling weaker and is getting lightheaded.  Patient states he called his oncologist and was instructed to come to the ED as he could be dehydrated.  Past Medical History:  Diagnosis Date  . Anemia 08/22/2020  . Heart murmur   . Hyperlipidemia   . Hypertension     Patient Active Problem List   Diagnosis Date Noted  . Iron deficiency anemia due to chronic blood loss 08/29/2020  . Malignant neoplasm of overlapping sites of esophagus ([place]) 08/28/2020  . GI bleed 08/22/2020  . Symptomatic anemia 08/21/2020  . Acute blood loss anemia 08/21/2020  . Hypertriglyceridemia 06/08/2020  . Mixed hyperlipidemia 03/06/2020  . Elevated coronary artery calcium score 11/12/2019  . Family history of early CAD 10/13/2019  . Exertional dyspnea 10/13/2019  . Essential hypertension 10/13/2019  . PAD (peripheral artery disease) ([place]) 10/13/2019    Past Surgical History:  Procedure Laterality Date  . APPENDECTOMY  1980  . BIOPSY  08/22/2020   Procedure: BIOPSY;  Surgeon: Clarene Essex, MD;  Location: [facility];  Service: Endoscopy;;  . ESOPHAGOGASTRODUODENOSCOPY  08/22/2020  . ESOPHAGOGASTRODUODENOSCOPY N/A 08/22/2020    Procedure: ESOPHAGOGASTRODUODENOSCOPY (EGD);  Surgeon: Clarene Essex, MD;  Location: [facility];  Service: Endoscopy;  Laterality: N/A;  . IR IMAGING GUIDED PORT INSERTION  09/07/2020  . LEFT HEART CATH AND CORONARY ANGIOGRAPHY N/A 11/23/2019   Procedure: LEFT HEART CATH AND CORONARY ANGIOGRAPHY;  Surgeon: Nigel Mormon, MD;  Location: [facility];  Service: Cardiovascular;  Laterality: N/A;       Family History  Problem Relation Age of Onset  . Heart disease Mother   . Atrial fibrillation Mother     Social History   Tobacco Use  . Smoking status: Never Smoker  . Smokeless tobacco: Never Used  Vaping Use  . Vaping Use: Never used  Substance Use Topics  . Alcohol use: Yes    Comment: 2 beers daily  . Drug use: Never    Home Medications Prior to Admission medications   Medication Sig Start Date End Date Taking? Authorizing Provider  HYDROmorphone HCl (DILAUDID) 1 MG/ML LIQD Take 2 mLs (2 mg total) by mouth every 4 (four) hours as needed for up to 5 days for severe pain. 10/08/20 10/13/20 Yes Dorie Rank, MD  acetaminophen (TYLENOL) 325 MG tablet Take 650 mg by mouth every 6 (six) hours as needed for mild pain.    [provider]  atorvastatin (LIPITOR) 40 MG tablet Take 1 tablet (40 mg total) by mouth daily. 12/03/19   Patwardhan, Reynold Bowen, MD  buPROPion (WELLBUTRIN XL) 150 MG 24 hr tablet Take 150 mg by mouth daily.  12/17/16   [provider]  dexamethasone (DECADRON) 4 MG tablet Take 2 tablets (8 mg total) by mouth daily. Start the day after chemotherapy for 2 days. 08/28/20   Brunetta Genera, MD  dronabinol (MARINOL) 2.5 MG capsule Take 1 capsule (2.5 mg total) by mouth 2 (two) times daily before a meal. 10/02/20   Brunetta Genera, MD  EPINEPHrine 0.3 mg/0.3 mL IJ SOAJ injection Inject 0.3 mg into the skin as needed for anaphylaxis (call 911). 05/01/16   [provider]  feeding supplement (ENSURE ENLIVE / ENSURE PLUS) LIQD Take 237 mLs  by mouth 2 (two) times daily between meals. 08/23/20   Regalado, Belkys A, MD  fexofenadine (ALLEGRA) 180 MG tablet Take 180 mg by mouth daily as needed for allergies. 11/30/09   [provider]  lidocaine-prilocaine (EMLA) cream Apply to affected area once 08/28/20   Brunetta Genera, MD  LORazepam (ATIVAN) 0.5 MG tablet Take 1 tablet (0.5 mg total) by mouth every 6 (six) hours as needed (Nausea or vomiting). 08/28/20   Brunetta Genera, MD  magic mouthwash w/lidocaine SOLN 1 Part viscous lidocaine 2% 1 Part Maalox 1 Part diphenhydramine 12.5 mg per 5 ml elixir  Use 2ml swish and swallow four times a day as needed for throat or esophageal pain from radiation. 09/25/20   Brunetta Genera, MD  metoprolol succinate (TOPROL XL) 50 MG 24 hr tablet Take 1 tablet (50 mg total) by mouth daily. Patient taking differently: Take 25 mg by mouth daily. 03/06/20   Patwardhan, Reynold Bowen, MD  Multiple Vitamin (MULTI-VITAMIN) tablet Take 1 tablet by mouth daily.    [provider]  Omega-3 1000 MG CAPS Take 1,000 mg by mouth once a week.    [provider]  ondansetron (ZOFRAN) 8 MG tablet Take 1 tablet (8 mg total) by mouth 2 (two) times daily as needed for refractory nausea / vomiting. Start on day 3 after chemo. 08/28/20   Brunetta Genera, MD  pantoprazole (PROTONIX) 40 MG tablet Take 1 tablet (40 mg total) by mouth 2 (two) times daily. 08/23/20   Regalado, Belkys A, MD  polyethylene glycol (MIRALAX / GLYCOLAX) 17 g packet Take 17 g by mouth daily as needed for mild constipation. 08/23/20   Regalado, Belkys A, MD  predniSONE (DELTASONE) 5 MG tablet 6 tab x 1 day, 5 tab x 1 day, 4 tab x 1 day, 3 tab x 1 day, 2 tab x 1 day, 1 tab x 1 day, stop 09/26/20   Harle Stanford., PA-C  prochlorperazine (COMPAZINE) 10 MG tablet Take 1 tablet (10 mg total) by mouth every 6 (six) hours as needed (Nausea or vomiting). 08/28/20   Brunetta Genera, MD  sucralfate (CARAFATE) 1 GM/10ML  suspension Take 10 mLs (1 g total) by mouth 2 (two) times daily. 09/25/20   Brunetta Genera, MD  triamcinolone lotion (KENALOG) 0.1 % Apply 1 application topically 3 (three) times daily. 09/26/20   Tanner, Lyndon Code., PA-C  vitamin B-12 (CYANOCOBALAMIN) 1000 MCG tablet Take 1,000 mcg by mouth daily.    [provider]    Allergies    Bee venom and Penicillin g  Review of Systems   Review of Systems  All other systems reviewed and are negative.   Physical Exam Updated Vital Signs BP (!) 147/82   Pulse 68   Temp 98.2 F (36.8 C) (Oral)   Resp 16   SpO2 100%   Physical Exam Vitals and  nursing note reviewed.  Constitutional:      Appearance: He is well-developed and well-nourished. He is not toxic-appearing or diaphoretic.  HENT:     Head: Normocephalic and atraumatic.     Right Ear: External ear normal.     Left Ear: External ear normal.     Mouth/Throat:     Comments: Mucous membranes moist, no exudates or lesions noted Eyes:     General: No scleral icterus.       Right eye: No discharge.        Left eye: No discharge.     Conjunctiva/sclera: Conjunctivae normal.  Neck:     Trachea: No tracheal deviation.  Cardiovascular:     Rate and Rhythm: Normal rate and regular rhythm.     Pulses: Intact distal pulses.  Pulmonary:     Effort: Pulmonary effort is normal. No respiratory distress.     Breath sounds: Normal breath sounds. No stridor. No wheezing or rales.  Abdominal:     General: Bowel sounds are normal. There is no distension.     Palpations: Abdomen is soft.     Tenderness: There is no abdominal tenderness. There is no guarding or rebound.  Musculoskeletal:        General: No tenderness or edema.     Cervical back: Neck supple.  Skin:    General: Skin is warm and dry.     Findings: No rash.  Neurological:     Mental Status: He is alert.     Cranial Nerves: No cranial nerve deficit (no facial droop, extraocular movements intact, no slurred speech).      Sensory: No sensory deficit.     Motor: No abnormal muscle tone or seizure activity.     Coordination: Coordination normal.     Deep Tendon Reflexes: Strength normal.  Psychiatric:        Mood and Affect: Mood and affect normal.     ED Results / Procedures / Treatments   Labs (all labs ordered are listed, but only abnormal results are displayed) Labs Reviewed  CBC WITH DIFFERENTIAL/PLATELET - Abnormal; Notable for the following components:      Result Value   WBC 0.9 (*)    RBC 4.09 (*)    Hemoglobin 11.0 (*)    HCT 33.4 (*)    RDW 20.6 (*)    Platelets 78 (*)    Neutro Abs 0.8 (*)    Lymphs Abs 0.0 (*)    All other components within normal limits  COMPREHENSIVE METABOLIC PANEL - Abnormal; Notable for the following components:   Sodium 132 (*)    Potassium 3.3 (*)    Chloride 97 (*)    Glucose, Bld 120 (*)    Creatinine, Ser 0.55 (*)    Calcium 8.3 (*)    Total Protein 6.2 (*)    Albumin 3.2 (*)    AST 154 (*)    ALT 169 (*)    Alkaline Phosphatase 361 (*)    All other components within normal limits  URINALYSIS, ROUTINE W REFLEX MICROSCOPIC    EKG None  Radiology No results found.  Procedures Procedures   Medications Ordered in ED Medications  lactated ringers bolus 2,000 mL (2,000 mLs Intravenous Bolus 10/08/20 1121)  HYDROmorphone (DILAUDID) injection 1 mg (1 mg Intravenous Given 10/08/20 1122)    ED Course  I have reviewed the triage vital signs and the nursing notes.  Pertinent labs & imaging results that were available during my care of the  patient were reviewed by me and considered in my medical decision making (see chart for details).  Clinical Course as of 10/08/20 1350  Sun Oct 08, 2020  1204 Patient's white blood cell count is decreased at 0.9.  86% neutrophils [JK]  1205 ANC 774 [JK]  1210 Blood pressure documented at 81/57.  I doubt this is a true value.  Repeat blood pressure is 147/74.  All his previous blood pressures were normal [JK]   1211 Electrolyte panel shows increasing LFTs [JK]  1349 Patient is feeling much better now.  He has been able to drink.  His pain is controlled.  He is feeling well and would like to go home [JK]  1349 Discussed case with Dr Chryl Heck [JK]    Clinical Course User Index [JK] Dorie Rank, MD   MDM Rules/Calculators/A&P                          Patient presented with complaints of inability to eat and drink after recent chemo and radiation treatments for his esophageal cancer.  Patient was given IV fluids in the ED.  He was also given IV Dilaudid.  Patient has had significant improvement in his symptoms.  He is able to drink and is feeling much better.  He would like to go home.  Patient was noted to be pancytopenic.  I did discuss his case with oncology Dr. Chryl Heck.  Patient's symptoms are likely related to his treatments.  Stable for continued outpatient management. Final Clinical Impression(s) / ED Diagnoses Final diagnoses:  Mucositis  Pancytopenia ([place])    Rx / DC Orders ED Discharge Orders         Ordered    HYDROmorphone HCl (DILAUDID) 1 MG/ML LIQD  Every 4 hours PRN        10/08/20 1348           Dorie Rank, MD 10/08/20 1354

## 2020-10-08 NOTE — Progress Notes (Signed)
HEMATOLOGY/ONCOLOGY CLINIC NOTE  Date of Service: 10/08/2020  Patient Care Team: Berkley Harvey, NP as PCP - General (Nurse Practitioner) Brunetta Genera, MD as Consulting Physician (Hematology) Jonnie Finner, RN as Oncology Nurse Navigator  CHIEF COMPLAINTS/PURPOSE OF CONSULTATION:  GEJ Adenocarcinoma   HISTORY OF PRESENTING ILLNESS:  Donald Wade is a wonderful 65 y.o. 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 Pt is accompanied today by his wife, Donald Wade. The pt reports that he is doing well overall.  The pt reports that he feels better today than he has in a month. Pt was seen by Vascular Surgery for his Peripheral Artery Disease and was placed on Plavix and Cilostazol in September. Pt began having black, tarry stools about 1.5 months ago that he thought was medication-related. He became very lightheaded and dizzy prior to his his hospital admission on 08/21/2020. Pt was given 2 units of PRBC and IV Iron in the hospital. Pt was placed on Protonix twice per day and was taken off of Plavix and Cilostazol prior to discharge.   Pt denies any difficulty swallowing and is taking measures to eat small bites. He has had noticeable acid reflux in the last three months that has worsened recently.   He has Hypertension and Hyperlipidemia. He denies any history of COPD, Emphysema, Allergies, Diabetes, or ongoing heart issues. Pt is up to date with his Colonoscopies and has never had any Prostate issues.    Pt has had Upper Endoscopy completed on 08/22/2020 with results revealing "- Likely malignant esophageal tumor was found in the lower third of the esophagus and at the gastroesophageal junction. Biopsied. 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 - Normal gastric body, antrum and prepyloric region of the stomach. - Normal duodenal bulb, first portion of the duodenum, second portion of the duodenum and third  portion of the duodenum. - The examination was otherwise normal."  Of note prior to the patient's visit today, pt has had Esophageal/Stomach Mass Surgical Pathology [phone]) completed on 08/22/2020 with results revealing "A. ESOPHAGUS, DISTAL MASS, BIOPSY:  - Poorly differentiated adenocarcinoma with extracellular mucin and  signet ring cells. B. STOMACH, MASS, BIOPSY: - Poorly differentiated adenocarcinoma with extracellular mucin and  signet ring cells."   Pt has had CT C/A/P ([id number]) ([id number]) completed on 08/22/2020 with results revealing "1. Thickened appearance of the wall of the stomach adjacent to the gastroesophageal junction as well as proximal gastric wall concerning for infiltrative neoplasm. 2. Rounded gastrohepatic lymph nodes as well as mildly enlarged retroperitoneal lymph nodes concerning for early nodal metastatic disease. 3. No evidence of metastatic disease in the chest. 4. Colonic diverticulosis. 5. Aortic Atherosclerosis."  Most recent lab results (08/23/2020) of CBC is as follows: all values are WNL except for WBC at 3.9K, RBC at 2.93, Hgb at 7.6, HCT at 25.1, MCH at 25.9, RDW at 16.4, Glucose at 101, Calcium at 8.4, Total Protein at 5.8, Albumin at 3.2.  On review of systems, pt denies dysphagia, SOB, abdominal pain, dysuria, back pain, leg swelling and any other symptoms.   On PMHx the pt reports PAD, HTN, HLD, Appendectomy, Left Heart Cath. On Social Hx the pt reports that he is a non-smoker. He drinks 3-4 beers per day on average. Pt denies any other recreational drug use.  On Family Hx the pt reports that his mother had Colon Cancer in her mid-60's and his father had Throat Cancer after an extensive  history of cigar smoking.   INTERVAL HISTORY  Mr Mascioli was seen in follow-up for toxicity check and clinical evaluation prior to his second cycle of concurrent carboplatin/Taxol with radiation. The patient's last visit with [place] was on 09/18/2020.   The pt  reports that he recently had a visit to the ED yesterday due to mouth sores. He was given a medicine for pain, but the pt reports he did not take this last night due to fear of allergic reaction and a head rush.   The pt notes that his urine was extremely dark yesterday and he has been experiencing intermittent diarrhea over the last three days.  Lab results today 10/09/2020 of CBC w/diff and CMP is as follows: all values are WNL except for WBC of 0.9K, RBC of 3.89, Hgb of 10.4, HCT of 31.0, MCV of 79.7, RDW of 20.5, Plt of 73K, Neutro Abs of 0.8K, Lymph Abs of 0.0K, Sodium of 133, Potassium of 3.3, Creatinine of 0.60, Caclium of 8.5, Total Protein of 6.0, Albumin of 2.8, AST of 102, ALT of 144, Alkaline Phosphatase of 386. 10/09/2020 Ferritin in progress.  On review of systems, pt reports mouth sores, mouth pain, change in urination, intermittent diarrhea and denies fevers, changes in bowel movements, abdominal pain, SOB, leg swelling and any other symptoms.  MEDICAL HISTORY:  Past Medical History:  Diagnosis Date  . Anemia 08/22/2020  . Heart murmur   . Hyperlipidemia   . Hypertension     SURGICAL HISTORY: Past Surgical History:  Procedure Laterality Date  . APPENDECTOMY  1980  . BIOPSY  08/22/2020   Procedure: BIOPSY;  Surgeon: Clarene Essex, MD;  Location: [facility] ENDOSCOPY;  Service: Endoscopy;;  . ESOPHAGOGASTRODUODENOSCOPY  08/22/2020  . ESOPHAGOGASTRODUODENOSCOPY N/A 08/22/2020   Procedure: ESOPHAGOGASTRODUODENOSCOPY (EGD);  Surgeon: Clarene Essex, MD;  Location: [facility];  Service: Endoscopy;  Laterality: N/A;  . IR IMAGING GUIDED PORT INSERTION  09/07/2020  . LEFT HEART CATH AND CORONARY ANGIOGRAPHY N/A 11/23/2019   Procedure: LEFT HEART CATH AND CORONARY ANGIOGRAPHY;  Surgeon: Nigel Mormon, MD;  Location: [facility];  Service: Cardiovascular;  Laterality: N/A;    SOCIAL HISTORY: Social History   Socioeconomic History  . Marital status: Married    Spouse name:  Not on file  . Number of children: 2  . Years of education: Not on file  . Highest education level: Not on file  Occupational History  . Not on file  Tobacco Use  . Smoking status: Never Smoker  . Smokeless tobacco: Never Used  Vaping Use  . Vaping Use: Never used  Substance and Sexual Activity  . Alcohol use: Yes    Comment: 2 beers daily  . Drug use: Never  . Sexual activity: Not on file  Other Topics Concern  . Not on file  Social History Narrative  . Not on file   Social Determinants of Health   Financial Resource Strain: Not on file  Food Insecurity: Not on file  Transportation Needs: Not on file  Physical Activity: Not on file  Stress: Not on file  Social Connections: Not on file  Intimate Partner Violence: Not on file    FAMILY HISTORY: Family History  Problem Relation Age of Onset  . Heart disease Mother   . Atrial fibrillation Mother     ALLERGIES:  is allergic to bee venom and penicillin g.  MEDICATIONS:  Current Outpatient Medications  Medication Sig Dispense Refill  . acetaminophen (TYLENOL) 325 MG tablet Take 650  mg by mouth every 6 (six) hours as needed for mild pain.    Marland Kitchen atorvastatin (LIPITOR) 40 MG tablet Take 1 tablet (40 mg total) by mouth daily. 90 tablet 3  . buPROPion (WELLBUTRIN XL) 150 MG 24 hr tablet Take 150 mg by mouth daily.     Marland Kitchen dexamethasone (DECADRON) 4 MG tablet Take 2 tablets (8 mg total) by mouth daily. Start the day after chemotherapy for 2 days. 30 tablet 1  . dronabinol (MARINOL) 2.5 MG capsule Take 1 capsule (2.5 mg total) by mouth 2 (two) times daily before a meal. 60 capsule 0  . EPINEPHrine 0.3 mg/0.3 mL IJ SOAJ injection Inject 0.3 mg into the skin as needed for anaphylaxis (call 911).    . feeding supplement (ENSURE ENLIVE / ENSURE PLUS) LIQD Take 237 mLs by mouth 2 (two) times daily between meals. 237 mL 12  . fexofenadine (ALLEGRA) 180 MG tablet Take 180 mg by mouth daily as needed for allergies.    Marland Kitchen  lidocaine-prilocaine (EMLA) cream Apply to affected area once 30 g 3  . LORazepam (ATIVAN) 0.5 MG tablet Take 1 tablet (0.5 mg total) by mouth every 6 (six) hours as needed (Nausea or vomiting). 30 tablet 0  . magic mouthwash w/lidocaine SOLN 1 Part viscous lidocaine 2% 1 Part Maalox 1 Part diphenhydramine 12.5 mg per 5 ml elixir  Use 73ml swish and swallow four times a day as needed for throat or esophageal pain from radiation. 200 mL 1  . metoprolol succinate (TOPROL XL) 50 MG 24 hr tablet Take 1 tablet (50 mg total) by mouth daily. (Patient taking differently: Take 25 mg by mouth daily.) 90 tablet 2  . Multiple Vitamin (MULTI-VITAMIN) tablet Take 1 tablet by mouth daily.    . Omega-3 1000 MG CAPS Take 1,000 mg by mouth once a week.    . ondansetron (ZOFRAN) 8 MG tablet Take 1 tablet (8 mg total) by mouth 2 (two) times daily as needed for refractory nausea / vomiting. Start on day 3 after chemo. 30 tablet 1  . pantoprazole (PROTONIX) 40 MG tablet Take 1 tablet (40 mg total) by mouth 2 (two) times daily. 60 tablet 2  . polyethylene glycol (MIRALAX / GLYCOLAX) 17 g packet Take 17 g by mouth daily as needed for mild constipation. 14 each 0  . predniSONE (DELTASONE) 5 MG tablet 6 tab x 1 day, 5 tab x 1 day, 4 tab x 1 day, 3 tab x 1 day, 2 tab x 1 day, 1 tab x 1 day, stop 21 tablet 0  . prochlorperazine (COMPAZINE) 10 MG tablet Take 1 tablet (10 mg total) by mouth every 6 (six) hours as needed (Nausea or vomiting). 30 tablet 1  . sucralfate (CARAFATE) 1 GM/10ML suspension Take 10 mLs (1 g total) by mouth 2 (two) times daily. 420 mL 1  . triamcinolone lotion (KENALOG) 0.1 % Apply 1 application topically 3 (three) times daily. 120 mL 3  . vitamin B-12 (CYANOCOBALAMIN) 1000 MCG tablet Take 1,000 mcg by mouth daily.     No current facility-administered medications for this visit.    REVIEW OF SYSTEMS:   10 Point review of Systems was done is negative except as noted above.   PHYSICAL  EXAMINATION: ECOG PERFORMANCE STATUS: 1 - Symptomatic but completely ambulatory  Exam was given in a chair. VS reviewed GENERAL:alert, in no acute distress and comfortable SKIN: no acute rashes, no significant lesions EYES: conjunctiva are pink and non-injected, sclera anicteric OROPHARYNX: MMM,  no exudates, no oropharyngeal erythema or ulceration NECK: supple, no JVD LYMPH:  no palpable lymphadenopathy in the cervical, axillary or inguinal regions LUNGS: clear to auscultation b/l with normal respiratory effort HEART: regular rate & rhythm ABDOMEN:  normoactive bowel sounds , non tender, not distended. Extremity: no pedal edema PSYCH: alert & oriented x 3 with fluent speech NEURO: no focal motor/sensory deficits   LABORATORY DATA:  I have reviewed the data as listed  CBC Latest Ref Rng & Units 10/09/2020 10/08/2020 10/02/2020  WBC 4.0 - 10.5 K/uL 0.9(LL) 0.9(LL) 2.3(L)  Hemoglobin 13.0 - 17.0 g/dL 10.4(L) 11.0(L) 11.3(L)  Hematocrit 39.0 - 52.0 % 31.0(L) 33.4(L) 33.9(L)  Platelets 150 - 400 K/uL 73(L) 78(L) 106(L)    . CMP Latest Ref Rng & Units 10/09/2020 10/08/2020 10/02/2020  Glucose 70 - 99 mg/dL 99 120(H) 115(H)  BUN 8 - 23 mg/dL 10 12 10   Creatinine 0.61 - 1.24 mg/dL 0.60(L) 0.55(L) 0.68  Sodium 135 - 145 mmol/L 133(L) 132(L) 131(L)  Potassium 3.5 - 5.1 mmol/L 3.3(L) 3.3(L) 3.7  Chloride 98 - 111 mmol/L 99 97(L) 99  CO2 22 - 32 mmol/L 25 24 24   Calcium 8.9 - 10.3 mg/dL 8.5(L) 8.3(L) 8.8(L)  Total Protein 6.5 - 8.1 g/dL 6.0(L) 6.2(L) 6.6  Total Bilirubin 0.3 - 1.2 mg/dL 0.7 0.7 0.6  Alkaline Phos 38 - 126 U/L 386(H) 361(H) 130(H)  AST 15 - 41 U/L 102(H) 154(H) 20  ALT 0 - 44 U/L 144(H) 169(H) 23    08/22/2020 Esophageal/Stomach Mass Surgical Pathology [phone]):   RADIOGRAPHIC STUDIES: I have personally reviewed the radiological images as listed and agreed with the findings in the report. No results found.  ASSESSMENT & PLAN:   65 yo male with no  significant smoking or tobacco use h/o but with chronic reflux and etoh use with   1) Newly diagnosed GE junction poorly differentiated with extracellular mucin and signet ring cells. [id number] No overt obstructive symptoms at this time Esophageal/Stomach Mass Surgical Pathology [phone]) which revealed "A. ESOPHAGUS, DISTAL MASS, BIOPSY:  - Poorly differentiated adenocarcinoma with extracellular mucin and  signet ring cells. B. STOMACH, MASS, BIOPSY: - Poorly differentiated adenocarcinoma with extracellular mucin and signet ring cells."  PET/CT 08/31/2020- Wall thickening/mass centered in the gastric cardia, compatible with the patient's known primary neoplasm. This predominantly involves the proximal stomach rather than the distal esophagus. Small gastrohepatic and para-aortic nodes, suspicious for nodal metastases  2) s/p GI bleeding- melena with symptomatic anemia s/p PRBC transfusion.  No further evidence of GI bleeding at this time. 3) Iron deficiency anemia from GI bleeding - additional IV iron ordered   PLAN: -Discussed patient's most recent labs from 10/09/2020; neutropenic, elevation in liver functions and Alkaline Phosphatase. -Will have to hold C5 chemotherapy today. Will not pursue a C6 unless counts very much improved. -Continue 9 days of radiation, finishing 10/18/2020.  -Advised pt to start the Sucralfate twice daily and continue the daily Magic mouthwash. Continue the baking soda/salt mouthwash. -Will evaluate in 1 week regarding feeding tube and potential Fentanyl patch for pain. -Will get [place] abd to evaluate liver function. Advised pt he could be developing metastasis spreading to the liver. -Advised pt he can use Magic mouthwash 4x daily and Sucralfate 3x daily prn. -Will get IV fluids today. 2L over 2 hours. -Rx Nystatin for potential thrush. Rx low dosage Fentanyl patch. Advised pt he can use his pain medications in addition to this. -Continue IV iron as per  plan  FOLLOW UP:  Please discontinue cycle 6 of chemotherapy scheduled in 1 week Labs and IV fluids [normal saline 1 L] twice weekly on Monday and Thursday starting from this Thursday. MD visit in 10 days.   All of the patients questions were answered with apparent satisfaction. The patient knows to call the clinic with any problems, questions or concerns.   The total time spent in the appointment was 30 minutes and more than 50% was on counseling and direct patient cares.   Sullivan Lone MD [place] AAHIVMS [facility] Hematology/Oncology Physician [facility]  (Office):       [phone] (Work cell):  [phone] (Fax):           [phone]  I, Reinaldo Raddle, am acting as scribe for Dr. Sullivan Lone, MD.   .I have reviewed the above documentation for accuracy and completeness, and I agree with the above. Brunetta Genera MD

## 2020-10-08 NOTE — ED Triage Notes (Signed)
Patient complains of pain with eating and drinking, making him unable ot eat or drink x3 days. Hx of esophageal cancer and radiation trts. Endorses dizziness with standing as of today. Ambulatory w/o assistance. Called cancer center and they advised him to come to ED w/ concerns for dehydration.

## 2020-10-09 ENCOUNTER — Inpatient Hospital Stay: Payer: No Typology Code available for payment source | Admitting: Hematology

## 2020-10-09 ENCOUNTER — Inpatient Hospital Stay: Payer: No Typology Code available for payment source

## 2020-10-09 ENCOUNTER — Ambulatory Visit
Admission: RE | Admit: 2020-10-09 | Discharge: 2020-10-09 | Disposition: A | Discharge status: Home / Self Care | Payer: No Typology Code available for payment source | Source: Ambulatory Visit | Attending: Radiation Oncology | Admitting: Radiation Oncology

## 2020-10-09 ENCOUNTER — Other Ambulatory Visit: Payer: Self-pay

## 2020-10-09 ENCOUNTER — Inpatient Hospital Stay: Payer: No Typology Code available for payment source | Admitting: Nutrition

## 2020-10-09 VITALS — BP 126/76 | HR 72 | Temp 97.9°F | Resp 17 | Wt 182.2 lb

## 2020-10-09 DIAGNOSIS — K1233 Oral mucositis (ulcerative) due to radiation: Secondary | ICD-10-CM | POA: Diagnosis not present

## 2020-10-09 DIAGNOSIS — C158 Malignant neoplasm of overlapping sites of esophagus: Secondary | ICD-10-CM

## 2020-10-09 DIAGNOSIS — R7989 Other specified abnormal findings of blood chemistry: Secondary | ICD-10-CM

## 2020-10-09 DIAGNOSIS — Z5111 Encounter for antineoplastic chemotherapy: Secondary | ICD-10-CM | POA: Diagnosis not present

## 2020-10-09 DIAGNOSIS — T451X5A Adverse effect of antineoplastic and immunosuppressive drugs, initial encounter: Secondary | ICD-10-CM

## 2020-10-09 DIAGNOSIS — D701 Agranulocytosis secondary to cancer chemotherapy: Secondary | ICD-10-CM

## 2020-10-09 DIAGNOSIS — R945 Abnormal results of liver function studies: Secondary | ICD-10-CM

## 2020-10-09 DIAGNOSIS — D500 Iron deficiency anemia secondary to blood loss (chronic): Secondary | ICD-10-CM

## 2020-10-09 DIAGNOSIS — C160 Malignant neoplasm of cardia: Secondary | ICD-10-CM | POA: Diagnosis not present

## 2020-10-09 LAB — CMP (CANCER CENTER ONLY)
ALT: 144 U/L — ABNORMAL HIGH (ref 0–44)
AST: 102 U/L — ABNORMAL HIGH (ref 15–41)
Albumin: 2.8 g/dL — ABNORMAL LOW (ref 3.5–5.0)
Alkaline Phosphatase: 386 U/L — ABNORMAL HIGH (ref 38–126)
Anion gap: 9 (ref 5–15)
BUN: 10 mg/dL (ref 8–23)
CO2: 25 mmol/L (ref 22–32)
Calcium: 8.5 mg/dL — ABNORMAL LOW (ref 8.9–10.3)
Chloride: 99 mmol/L (ref 98–111)
Creatinine: 0.6 mg/dL — ABNORMAL LOW (ref 0.61–1.24)
GFR, Estimated: >60 mL/min (ref >60)
Glucose, Bld: 99 mg/dL (ref 70–99)
Potassium: 3.3 mmol/L — ABNORMAL LOW (ref 3.5–5.1)
Sodium: 133 mmol/L — ABNORMAL LOW (ref 135–145)
Total Bilirubin: 0.7 mg/dL (ref 0.3–1.2)
Total Protein: 6 g/dL — ABNORMAL LOW (ref 6.5–8.1)

## 2020-10-09 LAB — CBC WITH DIFFERENTIAL (CANCER CENTER ONLY)
Abs Immature Granulocytes: 0 10*3/uL (ref 0.00–0.07)
Basophils Absolute: 0 10*3/uL (ref 0.0–0.1)
Basophils Relative: 1 %
Eosinophils Absolute: 0 10*3/uL (ref 0.0–0.5)
Eosinophils Relative: 1 %
HCT: 31 % — ABNORMAL LOW (ref 39.0–52.0)
Hemoglobin: 10.4 g/dL — ABNORMAL LOW (ref 13.0–17.0)
Immature Granulocytes: 0 %
Lymphocytes Relative: 3 %
Lymphs Abs: 0 10*3/uL — ABNORMAL LOW (ref 0.7–4.0)
MCH: 26.7 pg (ref 26.0–34.0)
MCHC: 33.5 g/dL (ref 30.0–36.0)
MCV: 79.7 fL — ABNORMAL LOW (ref 80.0–100.0)
Monocytes Absolute: 0.1 10*3/uL (ref 0.1–1.0)
Monocytes Relative: 10 %
Neutro Abs: 0.8 10*3/uL — ABNORMAL LOW (ref 1.7–7.7)
Neutrophils Relative %: 85 %
Platelet Count: 73 10*3/uL — ABNORMAL LOW (ref 150–400)
RBC: 3.89 MIL/uL — ABNORMAL LOW (ref 4.22–5.81)
RDW: 20.5 % — ABNORMAL HIGH (ref 11.5–15.5)
WBC Count: 0.9 10*3/uL — CL (ref 4.0–10.5)
nRBC: 0 % (ref 0.0–0.2)

## 2020-10-09 LAB — FERRITIN: Ferritin: 7463 ng/mL — ABNORMAL HIGH (ref 24–336)

## 2020-10-09 MED ORDER — SODIUM CHLORIDE 0.9 % IV SOLN
INTRAVENOUS | Status: DC
  Filled 2020-10-09 (×2): qty 250

## 2020-10-09 MED ORDER — SODIUM CHLORIDE 0.9 % IV SOLN
INTRAVENOUS | Status: AC
  Filled 2020-10-09: qty 250

## 2020-10-09 MED ORDER — SODIUM CHLORIDE 0.9 % IV SOLN
Freq: Once | INTRAVENOUS | Status: AC
  Filled 2020-10-09: qty 250

## 2020-10-09 MED ORDER — SODIUM CHLORIDE 0.9% FLUSH
10.0000 mL | Freq: Once | INTRAVENOUS | Status: AC | PRN
  Administered 2020-10-09: 10 mL
  Filled 2020-10-09: qty 10

## 2020-10-09 MED ORDER — HEPARIN SOD (PORK) LOCK FLUSH 100 UNIT/ML IV SOLN
500.0000 [IU] | Freq: Once | INTRAVENOUS | Status: AC | PRN
  Administered 2020-10-09: 500 [IU]
  Filled 2020-10-09: qty 5

## 2020-10-09 MED ORDER — FENTANYL 12 MCG/HR TD PT72: 1.0000 | MEDICATED_PATCH | TRANSDERMAL | 0 refills | Status: DC

## 2020-10-09 MED ORDER — NYSTATIN 100000 UNIT/ML MT SUSP: 5.0000 mL | Freq: Four times a day (QID) | OROMUCOSAL | 0 refills | Status: AC

## 2020-10-09 NOTE — Progress Notes (Signed)
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 Reports painful swallowing continues.  He discussed with MD who is prescribing additional pain medication. He has not been able to eat and drink much. Noted labs: Sodium 133, potassium 3.3, creatinine 0.6, albumin 2.8. Noted weight was documented as 182.25 pounds January 31 down from 183.4 pounds January 24. Patient discouraged.  Estimated nutrition needs: 2200-2400 cal, 110-130 g protein, 2.2 L fluid.  Nutrition diagnosis: Inadequate oral intake continues.  Intervention: I provided support and encouragement for patient to continue strategies for increasing calories and protein. Educated patient on adjusting temperatures and textures of foods to ease swallowing. Encouraged him to take pain medications as prescribed and encouraged by MD. When tolerated, consume Ensure Plus or equivalent 4 times a day to provide 1400 cal, 120 g protein.  Monitoring, evaluation, goals: Patient will work to increase calories and protein to minimize further weight loss.  Next visit: Monday, February 7 during infusion.  **Disclaimer: This note was dictated with voice recognition software. Similar sounding words can inadvertently be transcribed and this note may contain transcription errors which may not have been corrected upon publication of note.**

## 2020-10-09 NOTE — Progress Notes (Signed)
CRITICAL VALUE STICKER  CRITICAL VALUE: WBC 0.9  RECEIVER (on-site recipient of call): Jobe Igo., RN  DATE & TIME NOTIFIED: 10/09/2020 @ 0933  MESSENGER (representative from lab): Pam  MD NOTIFIED: Fabienne Bruns  TIME OF NOTIFICATION: 0935  RESPONSE: MD to address with patient in infusion room.

## 2020-10-09 NOTE — Progress Notes (Signed)
Patient was assessed in the infusion room by Dr. Irene Limbo and patient will not receive treatment today or venofer since [place] 0/8 and unable to tolerate any intake. Patient will instead receive 2L NS over 2 hours

## 2020-10-09 NOTE — Patient Instructions (Signed)
Dehydration, Adult Dehydration is condition in which there is not enough water or other fluids in the body. This happens when a person loses more fluids than he or she takes in. Important body parts cannot work right without the right amount of fluids. Any loss of fluids from the body can cause dehydration. Dehydration can be mild, worse, or very bad. It should be treated right away to keep it from getting very bad. What are the causes? This condition may be caused by:  Conditions that cause loss of water or other fluids, such as: ? Watery poop (diarrhea). ? Vomiting. ? Sweating a lot. ? Peeing (urinating) a lot.  Not drinking enough fluids, especially when you: ? Are ill. ? Are doing things that take a lot of energy to do.  Other illnesses and conditions, such as fever or infection.  Certain medicines, such as medicines that take extra fluid out of the body (diuretics).  Lack of safe drinking water.  Not being able to get enough water and food. What increases the risk? The following factors may make you more likely to develop this condition:  Having a long-term (chronic) illness that has not been treated the right way, such as: ? Diabetes. ? Heart disease. ? Kidney disease.  Being 63 years of age or older.  Having a disability.  Living in a place that is high above the ground or sea (high in altitude). The thinner, dried air causes more fluid loss.  Doing exercises that put stress on your body for a long time. What are the signs or symptoms? Symptoms of dehydration depend on how bad it is. Mild or worse dehydration  Thirst.  Dry lips or dry mouth.  Feeling dizzy or light-headed, especially when you stand up from sitting.  Muscle cramps.  Your body making: ? Dark pee (urine). Pee may be the color of tea. ? Less pee than normal. ? Less tears than normal.  Headache. Very bad dehydration  Changes in skin. Skin may: ? Be cold to the touch (clammy). ? Be blotchy  or pale. ? Not go back to normal right after you lightly pinch it and let it go.  Little or no tears, pee, or sweat.  Changes in vital signs, such as: ? Fast breathing. ? Low blood pressure. ? Weak pulse. ? Pulse that is more than 100 beats a minute when you are sitting still.  Other changes, such as: ? Feeling very thirsty. ? Eyes that look hollow (sunken). ? Cold hands and feet. ? Being mixed up (confused). ? Being very tired (lethargic) or having trouble waking from sleep. ? Short-term weight loss. ? Loss of consciousness. How is this treated? Treatment for this condition depends on how bad it is. Treatment should start right away. Do not wait until your condition gets very bad. Very bad dehydration is an emergency. You will need to go to a hospital.  Mild or worse dehydration can be treated at home. You may be asked to: ? Drink more fluids. ? Drink an oral rehydration solution (ORS). This drink helps get the right amounts of fluids and salts and minerals in the blood (electrolytes).  Very bad dehydration can be treated: ? With fluids through an IV tube. ? By getting normal levels of salts and minerals in your blood. This is often done by giving salts and minerals through a tube. The tube is passed through your nose and into your stomach. ? By treating the root cause. Follow these instructions at   home: Oral rehydration solution If told by your doctor, drink an ORS:  Make an ORS. Use instructions on the package.  Start by drinking small amounts, about  cup (120 mL) every 5-10 minutes.  Slowly drink more until you have had the amount that your doctor said to have. Eating and drinking  Drink enough clear fluid to keep your pee pale yellow. If you were told to drink an ORS, finish the ORS first. Then, start slowly drinking other clear fluids. Drink fluids such as: ? Water. Do not drink only water. Doing that can make the salt (sodium) level in your body get too low. ? Water  from ice chips you suck on. ? Fruit juice that you have added water to (diluted). ? Low-calorie sports drinks.  Eat foods that have the right amounts of salts and minerals, such as: ? Bananas. ? Oranges. ? Potatoes. ? Tomatoes. ? Spinach.  Do not drink alcohol.  Avoid: ? Drinks that have a lot of sugar. These include:  High-calorie sports drinks.  Fruit juice that you did not add water to.  Soda.  Caffeine. ? Foods that are greasy or have a lot of fat or sugar.         General instructions  Take over-the-counter and prescription medicines only as told by your doctor.  Do not take salt tablets. Doing that can make the salt level in your body get too high.  Return to your normal activities as told by your doctor. Ask your doctor what activities are safe for you.  Keep all follow-up visits as told by your doctor. This is important. Contact a doctor if:  You have pain in your belly (abdomen) and the pain: ? Gets worse. ? Stays in one place.  You have a rash.  You have a stiff neck.  You get angry or annoyed (irritable) more easily than normal.  You are more tired or have a harder time waking than normal.  You feel: ? Weak or dizzy. ? Very thirsty. Get help right away if you have:  Any symptoms of very bad dehydration.  Symptoms of vomiting, such as: ? You cannot eat or drink without vomiting. ? Your vomiting gets worse or does not go away. ? Your vomit has blood or green stuff in it.  Symptoms that get worse with treatment.  A fever.  A very bad headache.  Problems with peeing or pooping (having a bowel movement), such as: ? Watery poop that gets worse or does not go away. ? Blood in your poop (stool). This may cause poop to look black and tarry. ? Not peeing in 6-8 hours. ? Peeing only a small amount of very dark pee in 6-8 hours.  Trouble breathing. These symptoms may be an emergency. Do not wait to see if the symptoms will go away. Get  medical help right away. Call your local emergency services (911 in the [place] Do not drive yourself to the hospital. Summary  Dehydration is a condition in which there is not enough water or other fluids in the body. This happens when a person loses more fluids than he or she takes in.  Treatment for this condition depends on how bad it is. Treatment should be started right away. Do not wait until your condition gets very bad.  Drink enough clear fluid to keep your pee pale yellow. If you were told to drink an oral rehydration solution (ORS), finish the ORS first. Then, start slowly drinking other clear fluids.    Take over-the-counter and prescription medicines only as told by your doctor.  Get help right away if you have any symptoms of very bad dehydration. This information is not intended to replace advice given to you by your health care provider. Make sure you discuss any questions you have with your health care provider. Document Revised: 04/08/2019 Document Reviewed: 04/08/2019 Elsevier Patient Education  2021 [employer]  

## 2020-10-10 ENCOUNTER — Ambulatory Visit
Admission: RE | Admit: 2020-10-10 | Discharge: 2020-10-10 | Disposition: A | Discharge status: Home / Self Care | Payer: No Typology Code available for payment source | Source: Ambulatory Visit | Attending: Radiation Oncology | Admitting: Radiation Oncology

## 2020-10-10 DIAGNOSIS — E860 Dehydration: Secondary | ICD-10-CM | POA: Diagnosis not present

## 2020-10-10 DIAGNOSIS — E785 Hyperlipidemia, unspecified: Secondary | ICD-10-CM | POA: Diagnosis not present

## 2020-10-10 DIAGNOSIS — Z7289 Other problems related to lifestyle: Secondary | ICD-10-CM | POA: Diagnosis not present

## 2020-10-10 DIAGNOSIS — C155 Malignant neoplasm of lower third of esophagus: Secondary | ICD-10-CM | POA: Insufficient documentation

## 2020-10-10 DIAGNOSIS — R1310 Dysphagia, unspecified: Secondary | ICD-10-CM | POA: Diagnosis not present

## 2020-10-10 DIAGNOSIS — R011 Cardiac murmur, unspecified: Secondary | ICD-10-CM | POA: Diagnosis not present

## 2020-10-10 DIAGNOSIS — K219 Gastro-esophageal reflux disease without esophagitis: Secondary | ICD-10-CM | POA: Diagnosis not present

## 2020-10-10 DIAGNOSIS — Z79891 Long term (current) use of opiate analgesic: Secondary | ICD-10-CM | POA: Diagnosis not present

## 2020-10-10 DIAGNOSIS — I739 Peripheral vascular disease, unspecified: Secondary | ICD-10-CM | POA: Diagnosis not present

## 2020-10-10 DIAGNOSIS — C160 Malignant neoplasm of cardia: Secondary | ICD-10-CM | POA: Diagnosis present

## 2020-10-10 DIAGNOSIS — I10 Essential (primary) hypertension: Secondary | ICD-10-CM | POA: Diagnosis not present

## 2020-10-10 DIAGNOSIS — Z510 Encounter for antineoplastic radiation therapy: Secondary | ICD-10-CM | POA: Insufficient documentation

## 2020-10-10 DIAGNOSIS — G893 Neoplasm related pain (acute) (chronic): Secondary | ICD-10-CM | POA: Diagnosis not present

## 2020-10-10 DIAGNOSIS — Z79899 Other long term (current) drug therapy: Secondary | ICD-10-CM | POA: Diagnosis not present

## 2020-10-11 ENCOUNTER — Other Ambulatory Visit: Payer: Self-pay

## 2020-10-11 ENCOUNTER — Ambulatory Visit (HOSPITAL_COMMUNITY)
Admission: RE | Admit: 2020-10-11 | Discharge: 2020-10-11 | Disposition: A | Discharge status: Home / Self Care | Payer: No Typology Code available for payment source | Source: Ambulatory Visit | Attending: Hematology | Admitting: Hematology

## 2020-10-11 ENCOUNTER — Ambulatory Visit
Admission: RE | Admit: 2020-10-11 | Discharge: 2020-10-11 | Disposition: A | Discharge status: Home / Self Care | Payer: No Typology Code available for payment source | Source: Ambulatory Visit | Attending: Radiation Oncology | Admitting: Radiation Oncology

## 2020-10-11 ENCOUNTER — Other Ambulatory Visit: Payer: Self-pay | Admitting: Hematology

## 2020-10-11 ENCOUNTER — Telehealth: Payer: Self-pay | Admitting: Hematology

## 2020-10-11 DIAGNOSIS — Z510 Encounter for antineoplastic radiation therapy: Secondary | ICD-10-CM | POA: Diagnosis not present

## 2020-10-11 DIAGNOSIS — C158 Malignant neoplasm of overlapping sites of esophagus: Secondary | ICD-10-CM | POA: Diagnosis not present

## 2020-10-11 DIAGNOSIS — Z5111 Encounter for antineoplastic chemotherapy: Secondary | ICD-10-CM | POA: Diagnosis present

## 2020-10-11 NOTE — Telephone Encounter (Signed)
Scheduled per 01/31 los, patient has been regarding upcoming appointments. Patient is notified.

## 2020-10-12 ENCOUNTER — Other Ambulatory Visit: Payer: Self-pay

## 2020-10-12 ENCOUNTER — Inpatient Hospital Stay: Payer: No Typology Code available for payment source | Attending: Hematology

## 2020-10-12 ENCOUNTER — Ambulatory Visit
Admission: RE | Admit: 2020-10-12 | Discharge: 2020-10-12 | Disposition: A | Discharge status: Home / Self Care | Payer: No Typology Code available for payment source | Source: Ambulatory Visit | Attending: Radiation Oncology | Admitting: Radiation Oncology

## 2020-10-12 VITALS — BP 130/76 | HR 81 | Temp 98.2°F | Resp 18 | Wt 180.1 lb

## 2020-10-12 DIAGNOSIS — Z79899 Other long term (current) drug therapy: Secondary | ICD-10-CM | POA: Insufficient documentation

## 2020-10-12 DIAGNOSIS — D500 Iron deficiency anemia secondary to blood loss (chronic): Secondary | ICD-10-CM

## 2020-10-12 DIAGNOSIS — C155 Malignant neoplasm of lower third of esophagus: Secondary | ICD-10-CM | POA: Insufficient documentation

## 2020-10-12 DIAGNOSIS — Z7289 Other problems related to lifestyle: Secondary | ICD-10-CM | POA: Insufficient documentation

## 2020-10-12 DIAGNOSIS — Z79891 Long term (current) use of opiate analgesic: Secondary | ICD-10-CM | POA: Insufficient documentation

## 2020-10-12 DIAGNOSIS — E785 Hyperlipidemia, unspecified: Secondary | ICD-10-CM | POA: Insufficient documentation

## 2020-10-12 DIAGNOSIS — E860 Dehydration: Secondary | ICD-10-CM | POA: Insufficient documentation

## 2020-10-12 DIAGNOSIS — G893 Neoplasm related pain (acute) (chronic): Secondary | ICD-10-CM | POA: Insufficient documentation

## 2020-10-12 DIAGNOSIS — Z510 Encounter for antineoplastic radiation therapy: Secondary | ICD-10-CM | POA: Insufficient documentation

## 2020-10-12 DIAGNOSIS — C160 Malignant neoplasm of cardia: Secondary | ICD-10-CM | POA: Insufficient documentation

## 2020-10-12 DIAGNOSIS — R1310 Dysphagia, unspecified: Secondary | ICD-10-CM | POA: Insufficient documentation

## 2020-10-12 DIAGNOSIS — I739 Peripheral vascular disease, unspecified: Secondary | ICD-10-CM | POA: Insufficient documentation

## 2020-10-12 DIAGNOSIS — R011 Cardiac murmur, unspecified: Secondary | ICD-10-CM | POA: Insufficient documentation

## 2020-10-12 DIAGNOSIS — C158 Malignant neoplasm of overlapping sites of esophagus: Secondary | ICD-10-CM

## 2020-10-12 DIAGNOSIS — I10 Essential (primary) hypertension: Secondary | ICD-10-CM | POA: Insufficient documentation

## 2020-10-12 DIAGNOSIS — K219 Gastro-esophageal reflux disease without esophagitis: Secondary | ICD-10-CM | POA: Insufficient documentation

## 2020-10-12 MED ORDER — HEPARIN SOD (PORK) LOCK FLUSH 100 UNIT/ML IV SOLN
500.0000 [IU] | Freq: Once | INTRAVENOUS | Status: AC
  Administered 2020-10-12: 500 [IU] via INTRAVENOUS
  Filled 2020-10-12: qty 5

## 2020-10-12 MED ORDER — SODIUM CHLORIDE 0.9% FLUSH
10.0000 mL | Freq: Once | INTRAVENOUS | Status: AC
  Administered 2020-10-12: 10 mL via INTRAVENOUS
  Filled 2020-10-12: qty 10

## 2020-10-12 MED ORDER — SODIUM CHLORIDE 0.9 % IV SOLN
Freq: Once | INTRAVENOUS | Status: AC
  Filled 2020-10-12: qty 250

## 2020-10-12 NOTE — Patient Instructions (Signed)
Dehydration, Adult Dehydration is condition in which there is not enough water or other fluids in the body. This happens when a person loses more fluids than he or she takes in. Important body parts cannot work right without the right amount of fluids. Any loss of fluids from the body can cause dehydration. Dehydration can be mild, worse, or very bad. It should be treated right away to keep it from getting very bad. What are the causes? This condition may be caused by:  Conditions that cause loss of water or other fluids, such as: ? Watery poop (diarrhea). ? Vomiting. ? Sweating a lot. ? Peeing (urinating) a lot.  Not drinking enough fluids, especially when you: ? Are ill. ? Are doing things that take a lot of energy to do.  Other illnesses and conditions, such as fever or infection.  Certain medicines, such as medicines that take extra fluid out of the body (diuretics).  Lack of safe drinking water.  Not being able to get enough water and food. What increases the risk? The following factors may make you more likely to develop this condition:  Having a long-term (chronic) illness that has not been treated the right way, such as: ? Diabetes. ? Heart disease. ? Kidney disease.  Being 65 years of age or older.  Having a disability.  Living in a place that is high above the ground or sea (high in altitude). The thinner, dried air causes more fluid loss.  Doing exercises that put stress on your body for a long time. What are the signs or symptoms? Symptoms of dehydration depend on how bad it is. Mild or worse dehydration  Thirst.  Dry lips or dry mouth.  Feeling dizzy or light-headed, especially when you stand up from sitting.  Muscle cramps.  Your body making: ? Dark pee (urine). Pee may be the color of tea. ? Less pee than normal. ? Less tears than normal.  Headache. Very bad dehydration  Changes in skin. Skin may: ? Be cold to the touch (clammy). ? Be blotchy  or pale. ? Not go back to normal right after you lightly pinch it and let it go.  Little or no tears, pee, or sweat.  Changes in vital signs, such as: ? Fast breathing. ? Low blood pressure. ? Weak pulse. ? Pulse that is more than 100 beats a minute when you are sitting still.  Other changes, such as: ? Feeling very thirsty. ? Eyes that look hollow (sunken). ? Cold hands and feet. ? Being mixed up (confused). ? Being very tired (lethargic) or having trouble waking from sleep. ? Short-term weight loss. ? Loss of consciousness. How is this treated? Treatment for this condition depends on how bad it is. Treatment should start right away. Do not wait until your condition gets very bad. Very bad dehydration is an emergency. You will need to go to a hospital.  Mild or worse dehydration can be treated at home. You may be asked to: ? Drink more fluids. ? Drink an oral rehydration solution (ORS). This drink helps get the right amounts of fluids and salts and minerals in the blood (electrolytes).  Very bad dehydration can be treated: ? With fluids through an IV tube. ? By getting normal levels of salts and minerals in your blood. This is often done by giving salts and minerals through a tube. The tube is passed through your nose and into your stomach. ? By treating the root cause. Follow these instructions at   home: Oral rehydration solution If told by your doctor, drink an ORS:  Make an ORS. Use instructions on the package.  Start by drinking small amounts, about  cup (120 mL) every 5-10 minutes.  Slowly drink more until you have had the amount that your doctor said to have. Eating and drinking  Drink enough clear fluid to keep your pee pale yellow. If you were told to drink an ORS, finish the ORS first. Then, start slowly drinking other clear fluids. Drink fluids such as: ? Water. Do not drink only water. Doing that can make the salt (sodium) level in your body get too low. ? Water  from ice chips you suck on. ? Fruit juice that you have added water to (diluted). ? Low-calorie sports drinks.  Eat foods that have the right amounts of salts and minerals, such as: ? Bananas. ? Oranges. ? Potatoes. ? Tomatoes. ? Spinach.  Do not drink alcohol.  Avoid: ? Drinks that have a lot of sugar. These include:  High-calorie sports drinks.  Fruit juice that you did not add water to.  Soda.  Caffeine. ? Foods that are greasy or have a lot of fat or sugar.         General instructions  Take over-the-counter and prescription medicines only as told by your doctor.  Do not take salt tablets. Doing that can make the salt level in your body get too high.  Return to your normal activities as told by your doctor. Ask your doctor what activities are safe for you.  Keep all follow-up visits as told by your doctor. This is important. Contact a doctor if:  You have pain in your belly (abdomen) and the pain: ? Gets worse. ? Stays in one place.  You have a rash.  You have a stiff neck.  You get angry or annoyed (irritable) more easily than normal.  You are more tired or have a harder time waking than normal.  You feel: ? Weak or dizzy. ? Very thirsty. Get help right away if you have:  Any symptoms of very bad dehydration.  Symptoms of vomiting, such as: ? You cannot eat or drink without vomiting. ? Your vomiting gets worse or does not go away. ? Your vomit has blood or green stuff in it.  Symptoms that get worse with treatment.  A fever.  A very bad headache.  Problems with peeing or pooping (having a bowel movement), such as: ? Watery poop that gets worse or does not go away. ? Blood in your poop (stool). This may cause poop to look black and tarry. ? Not peeing in 6-8 hours. ? Peeing only a small amount of very dark pee in 6-8 hours.  Trouble breathing. These symptoms may be an emergency. Do not wait to see if the symptoms will go away. Get  medical help right away. Call your local emergency services (911 in the [place] Do not drive yourself to the hospital. Summary  Dehydration is a condition in which there is not enough water or other fluids in the body. This happens when a person loses more fluids than he or she takes in.  Treatment for this condition depends on how bad it is. Treatment should be started right away. Do not wait until your condition gets very bad.  Drink enough clear fluid to keep your pee pale yellow. If you were told to drink an oral rehydration solution (ORS), finish the ORS first. Then, start slowly drinking other clear fluids.    Take over-the-counter and prescription medicines only as told by your doctor.  Get help right away if you have any symptoms of very bad dehydration. This information is not intended to replace advice given to you by your health care provider. Make sure you discuss any questions you have with your health care provider. Document Revised: 04/08/2019 Document Reviewed: 04/08/2019 Elsevier Patient Education  2021 Elsevier Inc.  

## 2020-10-13 ENCOUNTER — Ambulatory Visit
Admission: RE | Admit: 2020-10-13 | Discharge: 2020-10-13 | Disposition: A | Discharge status: Home / Self Care | Payer: No Typology Code available for payment source | Source: Ambulatory Visit | Attending: Radiation Oncology | Admitting: Radiation Oncology

## 2020-10-13 DIAGNOSIS — Z510 Encounter for antineoplastic radiation therapy: Secondary | ICD-10-CM | POA: Diagnosis not present

## 2020-10-16 ENCOUNTER — Ambulatory Visit: Payer: No Typology Code available for payment source

## 2020-10-16 ENCOUNTER — Ambulatory Visit: Payer: No Typology Code available for payment source | Admitting: Radiation Oncology

## 2020-10-16 ENCOUNTER — Inpatient Hospital Stay: Payer: No Typology Code available for payment source

## 2020-10-16 ENCOUNTER — Other Ambulatory Visit: Payer: Self-pay

## 2020-10-16 ENCOUNTER — Ambulatory Visit
Admission: RE | Admit: 2020-10-16 | Discharge: 2020-10-16 | Disposition: A | Discharge status: Home / Self Care | Payer: No Typology Code available for payment source | Source: Ambulatory Visit | Attending: Radiation Oncology | Admitting: Radiation Oncology

## 2020-10-16 ENCOUNTER — Inpatient Hospital Stay: Payer: No Typology Code available for payment source | Admitting: Hematology

## 2020-10-16 ENCOUNTER — Inpatient Hospital Stay: Payer: No Typology Code available for payment source | Admitting: Nutrition

## 2020-10-16 DIAGNOSIS — Z510 Encounter for antineoplastic radiation therapy: Secondary | ICD-10-CM | POA: Diagnosis not present

## 2020-10-17 ENCOUNTER — Ambulatory Visit
Admission: RE | Admit: 2020-10-17 | Discharge: 2020-10-17 | Disposition: A | Discharge status: Home / Self Care | Payer: No Typology Code available for payment source | Source: Ambulatory Visit | Attending: Radiation Oncology | Admitting: Radiation Oncology

## 2020-10-17 ENCOUNTER — Ambulatory Visit: Payer: No Typology Code available for payment source

## 2020-10-17 ENCOUNTER — Other Ambulatory Visit: Payer: Self-pay | Admitting: *Deleted

## 2020-10-17 ENCOUNTER — Other Ambulatory Visit: Payer: Self-pay

## 2020-10-17 DIAGNOSIS — C158 Malignant neoplasm of overlapping sites of esophagus: Secondary | ICD-10-CM

## 2020-10-17 DIAGNOSIS — Z510 Encounter for antineoplastic radiation therapy: Secondary | ICD-10-CM | POA: Diagnosis not present

## 2020-10-17 MED ORDER — MAGIC MOUTHWASH W/LIDOCAINE: ORAL | 1 refills | Status: DC

## 2020-10-17 NOTE — Telephone Encounter (Signed)
Wife requested refill of magic mouthwash for patient - refill called to [facility] (pharmacy on record) info given to [employer]

## 2020-10-17 NOTE — Telephone Encounter (Signed)
Patient's wife called and requested refill of oral pain med Hydromorphone 2 mg. Request routed to Dr. Irene Limbo

## 2020-10-17 NOTE — Progress Notes (Signed)
HEMATOLOGY/ONCOLOGY CLINIC NOTE  Date of Service: 10/17/2020  Patient Care Team: Donald Harvey, NP as PCP - General (Nurse Practitioner) Donald Genera, MD as Consulting Physician (Hematology) Donald Finner, RN as Oncology Nurse Navigator  CHIEF COMPLAINTS/PURPOSE OF CONSULTATION:  GEJ Adenocarcinoma   HISTORY OF PRESENTING ILLNESS:  Donald Wade is a wonderful 65 y.o. 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 Pt is accompanied today by his wife, Donald Wade. The pt reports that he is doing well overall.  The pt reports that he feels better today than he has in a month. Pt was seen by Vascular Surgery for his Peripheral Artery Disease and was placed on Plavix and Cilostazol in September. Pt began having black, tarry stools about 1.5 months ago that he thought was medication-related. He became very lightheaded and dizzy prior to his his hospital admission on 08/21/2020. Pt was given 2 units of PRBC and IV Iron in the hospital. Pt was placed on Protonix twice per day and was taken off of Plavix and Cilostazol prior to discharge.   Pt denies any difficulty swallowing and is taking measures to eat small bites. He has had noticeable acid reflux in the last three months that has worsened recently.   He has Hypertension and Hyperlipidemia. He denies any history of COPD, Emphysema, Allergies, Diabetes, or ongoing heart issues. Pt is up to date with his Colonoscopies and has never had any Prostate issues.    Pt has had Upper Endoscopy completed on 08/22/2020 with results revealing "- Likely malignant esophageal tumor was found in the lower third of the esophagus and at the gastroesophageal junction. Biopsied. 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 - Normal gastric body, antrum and prepyloric region of the stomach. - Normal duodenal bulb, first portion of the duodenum, second portion of the duodenum and third  portion of the duodenum. - The examination was otherwise normal."  Of note prior to the patient's visit today, pt has had Esophageal/Stomach Mass Surgical Pathology [phone]) completed on 08/22/2020 with results revealing "A. ESOPHAGUS, DISTAL MASS, BIOPSY:  - Poorly differentiated adenocarcinoma with extracellular mucin and  signet ring cells. B. STOMACH, MASS, BIOPSY: - Poorly differentiated adenocarcinoma with extracellular mucin and  signet ring cells."   Pt has had CT C/A/P ([id number]) ([id number]) completed on 08/22/2020 with results revealing "1. Thickened appearance of the wall of the stomach adjacent to the gastroesophageal junction as well as proximal gastric wall concerning for infiltrative neoplasm. 2. Rounded gastrohepatic lymph nodes as well as mildly enlarged retroperitoneal lymph nodes concerning for early nodal metastatic disease. 3. No evidence of metastatic disease in the chest. 4. Colonic diverticulosis. 5. Aortic Atherosclerosis."  Most recent lab results (08/23/2020) of CBC is as follows: all values are WNL except for WBC at 3.9K, RBC at 2.93, Hgb at 7.6, HCT at 25.1, MCH at 25.9, RDW at 16.4, Glucose at 101, Calcium at 8.4, Total Protein at 5.8, Albumin at 3.2.  On review of systems, pt denies dysphagia, SOB, abdominal pain, dysuria, back pain, leg swelling and any other symptoms.   On PMHx the pt reports PAD, HTN, HLD, Appendectomy, Left Heart Cath. On Social Hx the pt reports that he is a non-smoker. He drinks 3-4 beers per day on average. Pt denies any other recreational drug use.  On Family Hx the pt reports that his mother had Colon Cancer in her mid-60's and his father had Throat Cancer after an extensive  history of cigar smoking.   INTERVAL HISTORY  Donald Wade was seen in follow-up for toxicity check and clinical evaluation prior to completion of chemotherapy and his last radiation treatment on 10/18/2020. The patient's last visit with [place] was on 10/09/2020. He  reports he is doing well overall. We are joined today by his wife.  The pt reports that his throat is still extremely bothersome. He takes the pain medications, Fentanyl patch, Magic mouthwash. The pt noted that he was able to eat as normal on last Sunday, but following the last radiation treatment yesterday he is in very much pain. He has significant pain between the doses of the patch and medication. The pt notes he is able to eat and swallow well.   Of note since the patient's last visit, pt has had [place] Abd Complete ([id number]) on 10/11/2020, which revealed "1. Limited examination due to overlying bowel gas. 2. Normal gallbladder and normal liver.  No biliary dilatation. 3. Poor visualization of the IVC and pancreas. 4. Normal spleen and kidneys."  On review of systems, pt reports mouth pain, mouth sores, mucus, phlegm and denies fevers, chills, nausea, diarrhea, vomiting, black/bloody stools, back pain, thrush, abdominal pain, leg swelling and any other symptoms.  MEDICAL HISTORY:  Past Medical History:  Diagnosis Date  . Anemia 08/22/2020  . Heart murmur   . Hyperlipidemia   . Hypertension     SURGICAL HISTORY: Past Surgical History:  Procedure Laterality Date  . APPENDECTOMY  1980  . BIOPSY  08/22/2020   Procedure: BIOPSY;  Surgeon: Donald Essex, MD;  Location: [facility] ENDOSCOPY;  Service: Endoscopy;;  . ESOPHAGOGASTRODUODENOSCOPY  08/22/2020  . ESOPHAGOGASTRODUODENOSCOPY N/A 08/22/2020   Procedure: ESOPHAGOGASTRODUODENOSCOPY (EGD);  Surgeon: Donald Essex, MD;  Location: [place];  Service: Endoscopy;  Laterality: N/A;  . IR IMAGING GUIDED PORT INSERTION  09/07/2020  . LEFT HEART CATH AND CORONARY ANGIOGRAPHY N/A 11/23/2019   Procedure: LEFT HEART CATH AND CORONARY ANGIOGRAPHY;  Surgeon: Donald Mormon, MD;  Location: [place] [facility] LAB;  Service: Cardiovascular;  Laterality: N/A;    SOCIAL HISTORY: Social History   Socioeconomic History  . Marital status: Married     Spouse name: Not on file  . Number of children: 2  . Years of education: Not on file  . Highest education level: Not on file  Occupational History  . Not on file  Tobacco Use  . Smoking status: Never Smoker  . Smokeless tobacco: Never Used  Vaping Use  . Vaping Use: Never used  Substance and Sexual Activity  . Alcohol use: Yes    Comment: 2 beers daily  . Drug use: Never  . Sexual activity: Not on file  Other Topics Concern  . Not on file  Social History Narrative  . Not on file   Social Determinants of Health   Financial Resource Strain: Not on file  Food Insecurity: Not on file  Transportation Needs: Not on file  Physical Activity: Not on file  Stress: Not on file  Social Connections: Not on file  Intimate Partner Violence: Not on file    FAMILY HISTORY: Family History  Problem Relation Age of Onset  . Heart disease Mother   . Atrial fibrillation Mother     ALLERGIES:  is allergic to bee venom and penicillin g.  MEDICATIONS:  Current Outpatient Medications  Medication Sig Dispense Refill  . acetaminophen (TYLENOL) 325 MG tablet Take 650 mg by mouth every 6 (six) hours as needed for mild pain.    Marland Kitchen  atorvastatin (LIPITOR) 40 MG tablet Take 1 tablet (40 mg total) by mouth daily. 90 tablet 3  . buPROPion (WELLBUTRIN XL) 150 MG 24 hr tablet Take 150 mg by mouth daily.     Marland Kitchen dexamethasone (DECADRON) 4 MG tablet Take 2 tablets (8 mg total) by mouth daily. Start the day after chemotherapy for 2 days. 30 tablet 1  . dronabinol (MARINOL) 2.5 MG capsule Take 1 capsule (2.5 mg total) by mouth 2 (two) times daily before a meal. 60 capsule 0  . EPINEPHrine 0.3 mg/0.3 mL IJ SOAJ injection Inject 0.3 mg into the skin as needed for anaphylaxis (call 911).    . feeding supplement (ENSURE ENLIVE / ENSURE PLUS) LIQD Take 237 mLs by mouth 2 (two) times daily between meals. 237 mL 12  . fentaNYL (DURAGESIC) 12 MCG/HR Place 1 patch onto the skin every 3 (three) days. 5 patch 0  .  fexofenadine (ALLEGRA) 180 MG tablet Take 180 mg by mouth daily as needed for allergies.    Marland Kitchen HYDROmorphone (DILAUDID) 2 MG tablet Take 1 tablet (2 mg total) by mouth every 6 (six) hours as needed for severe pain. 16 tablet 0  . lidocaine-prilocaine (EMLA) cream Apply to affected area once 30 g 3  . LORazepam (ATIVAN) 0.5 MG tablet Take 1 tablet (0.5 mg total) by mouth every 6 (six) hours as needed (Nausea or vomiting). 30 tablet 0  . magic mouthwash w/lidocaine SOLN 1 Part viscous lidocaine 2% 1 Part Maalox 1 Part diphenhydramine 12.5 mg per 5 ml elixir  Use 20ml swish and swallow four times a day as needed for throat or esophageal pain from radiation. 200 mL 1  . metoprolol succinate (TOPROL XL) 50 MG 24 hr tablet Take 1 tablet (50 mg total) by mouth daily. (Patient taking differently: Take 25 mg by mouth daily.) 90 tablet 2  . Multiple Vitamin (MULTI-VITAMIN) tablet Take 1 tablet by mouth daily.    Marland Kitchen nystatin (MYCOSTATIN) 100000 UNIT/ML suspension Take 5 mLs (500,000 Units total) by mouth 4 (four) times daily for 10 days. 200 mL 0  . Omega-3 1000 MG CAPS Take 1,000 mg by mouth once a week.    . ondansetron (ZOFRAN) 8 MG tablet Take 1 tablet (8 mg total) by mouth 2 (two) times daily as needed for refractory nausea / vomiting. Start on day 3 after chemo. 30 tablet 1  . pantoprazole (PROTONIX) 40 MG tablet Take 1 tablet (40 mg total) by mouth 2 (two) times daily. 60 tablet 2  . polyethylene glycol (MIRALAX / GLYCOLAX) 17 g packet Take 17 g by mouth daily as needed for mild constipation. 14 each 0  . predniSONE (DELTASONE) 5 MG tablet 6 tab x 1 day, 5 tab x 1 day, 4 tab x 1 day, 3 tab x 1 day, 2 tab x 1 day, 1 tab x 1 day, stop 21 tablet 0  . prochlorperazine (COMPAZINE) 10 MG tablet Take 1 tablet (10 mg total) by mouth every 6 (six) hours as needed (Nausea or vomiting). 30 tablet 1  . sucralfate (CARAFATE) 1 GM/10ML suspension Take 10 mLs (1 g total) by mouth 2 (two) times daily. 420 mL 1  .  triamcinolone lotion (KENALOG) 0.1 % Apply 1 application topically 3 (three) times daily. 120 mL 3  . vitamin B-12 (CYANOCOBALAMIN) 1000 MCG tablet Take 1,000 mcg by mouth daily.     No current facility-administered medications for this visit.    REVIEW OF SYSTEMS:   10 Point review of  Systems was done is negative except as noted above.   PHYSICAL EXAMINATION: ECOG PERFORMANCE STATUS: 1 - Symptomatic but completely ambulatory .BP 119/79 (BP Location: Left Arm, Patient Position: Sitting)   Pulse (!) 114   Temp 97.7 F (36.5 C) (Tympanic)   Resp 19   Ht 6' (1.829 m)   Wt 175 lb 4.8 oz (79.5 kg)   SpO2 97%   BMI 23.77 kg/m   GENERAL:alert, in no acute distress and comfortable SKIN: no acute rashes, no significant lesions EYES: conjunctiva are pink and non-injected, sclera anicteric OROPHARYNX: MMM, no exudates, no oropharyngeal erythema or ulceration NECK: supple, no JVD LYMPH:  no palpable lymphadenopathy in the cervical, axillary or inguinal regions LUNGS: clear to auscultation b/l with normal respiratory effort HEART: regular rate & rhythm ABDOMEN:  normoactive bowel sounds , non tender, not distended. Extremity: no pedal edema PSYCH: alert & oriented x 3 with fluent speech NEURO: no focal motor/sensory deficits   LABORATORY DATA:  I have reviewed the data as listed  CBC Latest Ref Rng & Units 10/19/2020 10/09/2020 10/08/2020  WBC 4.0 - 10.5 K/uL 2.0(L) 0.9(LL) 0.9(LL)  Hemoglobin 13.0 - 17.0 g/dL 10.8(L) 10.4(L) 11.0(L)  Hematocrit 39.0 - 52.0 % 33.7(L) 31.0(L) 33.4(L)  Platelets 150 - 400 K/uL 196 73(L) 78(L)  ANC 1.5k  . CMP Latest Ref Rng & Units 10/19/2020 10/09/2020 10/08/2020  Glucose 70 - 99 mg/dL 115(H) 99 120(H)  BUN 8 - 23 mg/dL 7(L) 10 12  Creatinine 0.61 - 1.24 mg/dL 0.67 0.60(L) 0.55(L)  Sodium 135 - 145 mmol/L 134(L) 133(L) 132(L)  Potassium 3.5 - 5.1 mmol/L 3.5 3.3(L) 3.3(L)  Chloride 98 - 111 mmol/L 96(L) 99 97(L)  CO2 22 - 32 mmol/L 29 25 24    Calcium 8.9 - 10.3 mg/dL 8.6(L) 8.5(L) 8.3(L)  Total Protein 6.5 - 8.1 g/dL 6.2(L) 6.0(L) 6.2(L)  Total Bilirubin 0.3 - 1.2 mg/dL 0.5 0.7 0.7  Alkaline Phos 38 - 126 U/L 215(H) 386(H) 361(H)  AST 15 - 41 U/L 21 102(H) 154(H)  ALT 0 - 44 U/L 19 144(H) 169(H)    08/22/2020 Esophageal/Stomach Mass Surgical Pathology [phone]):   RADIOGRAPHIC STUDIES: I have personally reviewed the radiological images as listed and agreed with the findings in the report. US Abdomen Complete  Result Date: 10/11/2020 CLINICAL DATA:  Abnormal liver function studies. EXAM: ABDOMEN ULTRASOUND COMPLETE COMPARISON:  PET-CT 08/31/2020 FINDINGS: Gallbladder: No gallstones or wall thickening visualized. No sonographic Murphy sign noted by sonographer. Common bile duct: Diameter: 6.6 mm Liver: Normal echogenicity without focal lesion or biliary dilatation. Portal vein is patent on color Doppler imaging with normal direction of blood flow towards the liver. IVC: Poorly visualized. Pancreas: Poorly visualized Spleen: Normal size.  No focal lesions. Right Kidney: Length: 12.7 cm. Normal renal cortical thickness and echogenicity without focal lesions or hydronephrosis. Left Kidney: Length: 11.7 cm. Normal renal cortical thickness and echogenicity without focal lesions or hydronephrosis. Abdominal aorta: Normal caliber. Other findings: None. IMPRESSION: 1. Limited examination due to overlying bowel gas. 2. Normal gallbladder and normal liver.  No biliary dilatation. 3. Poor visualization of the IVC and pancreas. 4. Normal spleen and kidneys. Electronically Signed   By: Marijo Sanes M.D.   On: 10/11/2020 14:24    ASSESSMENT & PLAN:   65 yo male with no significant smoking or tobacco use h/o but with chronic reflux and etoh use with   1) Newly diagnosed GE junction poorly differentiated with extracellular mucin and signet ring cells. [id number] No overt obstructive  symptoms at this time Esophageal/Stomach Mass Surgical  Pathology [phone]) which revealed "A. ESOPHAGUS, DISTAL MASS, BIOPSY:  - Poorly differentiated adenocarcinoma with extracellular mucin and  signet ring cells. B. STOMACH, MASS, BIOPSY: - Poorly differentiated adenocarcinoma with extracellular mucin and signet ring cells."  PET/CT 08/31/2020- Wall thickening/mass centered in the gastric cardia, compatible with the patient's known primary neoplasm. This predominantly involves the proximal stomach rather than the distal esophagus. Small gastrohepatic and para-aortic nodes, suspicious for nodal metastases  2) s/p GI bleeding- melena with symptomatic anemia s/p PRBC transfusion.  No further evidence of GI bleeding at this time. 3) Iron deficiency anemia from GI bleeding - additional IV iron ordered 4) Abnormal LFTs -- resolved 5) Radiation mucositis PLAN: -Discussed patient's [place] Abd Complete ([id number]) on 10/11/2020; nothing new found. No concerns from this imaging. -Advised pt he is now in a recovery phase and done with radiation and chemotherapy. -Will get IV fluids tomorrow at 8:00 AM. Will continue with fluids scheduled as well at this time. -Recommend pt continue to attempt to drink Boost or Ensure to increase protein intake. -Will increase Fentanyl patch and refill. Continue to monitor within the next weeks. -Advised pt we will get repeat PET 10 weeks following radiation completion. -Recommend pt continue to eat well, drink 48-64 oz water daily, and walk 20-30 min daily. Recommend pt get proper sleep. -Recommend 5-10 minute walk following main meals to aid digestion. -Continue Marinol as same as current at this time. Will not increase the dosage. -Continue Metoprolol. Hold Lipitor. -Continue salt and baking soda mouth washes prn. -Will get labs today following MD visit. -Continue IV F as per plan. -Will see back in 2 weeks with labs.   FOLLOW UP: Labs today IVF 2L NS @ 8 AM tomorrow per infusion room F/u for other scheduled  infusion appointments  Portflush, labs and MD visit in 2 weeks   All of the patients questions were answered with apparent satisfaction. The patient knows to call the clinic with any problems, questions or concerns.  The total time spent in the appointment was 30 minutes and more than 50% was on counseling and direct patient cares, co-ordination of cares.   Sullivan Lone MD [place] AAHIVMS [facility] Hematology/Oncology Physician [facility]  (Office):       [phone] (Work cell):  [phone] (Fax):           [phone]  I, Reinaldo Raddle, am acting as scribe for Dr. Sullivan Lone, MD.   .I have reviewed the above documentation for accuracy and completeness, and I agree with the above. Donald Genera MD

## 2020-10-18 ENCOUNTER — Ambulatory Visit: Payer: No Typology Code available for payment source | Admitting: Radiation Oncology

## 2020-10-18 ENCOUNTER — Ambulatory Visit: Payer: No Typology Code available for payment source

## 2020-10-18 ENCOUNTER — Encounter: Payer: Self-pay | Admitting: Radiation Oncology

## 2020-10-18 ENCOUNTER — Ambulatory Visit
Admission: RE | Admit: 2020-10-18 | Discharge: 2020-10-18 | Disposition: A | Discharge status: Home / Self Care | Payer: No Typology Code available for payment source | Source: Ambulatory Visit | Attending: Radiation Oncology | Admitting: Radiation Oncology

## 2020-10-18 DIAGNOSIS — Z510 Encounter for antineoplastic radiation therapy: Secondary | ICD-10-CM | POA: Diagnosis not present

## 2020-10-18 MED ORDER — HYDROMORPHONE HCL 2 MG PO TABS: 2.0000 mg | ORAL_TABLET | Freq: Four times a day (QID) | ORAL | 0 refills | Status: DC | PRN

## 2020-10-19 ENCOUNTER — Ambulatory Visit: Payer: No Typology Code available for payment source

## 2020-10-19 ENCOUNTER — Inpatient Hospital Stay: Payer: No Typology Code available for payment source | Admitting: Hematology

## 2020-10-19 ENCOUNTER — Other Ambulatory Visit: Payer: Self-pay

## 2020-10-19 ENCOUNTER — Telehealth: Payer: Self-pay | Admitting: *Deleted

## 2020-10-19 ENCOUNTER — Inpatient Hospital Stay: Payer: No Typology Code available for payment source

## 2020-10-19 VITALS — BP 119/79 | HR 114 | Temp 97.7°F | Resp 19 | Ht 72.0 in | Wt 175.3 lb

## 2020-10-19 DIAGNOSIS — K922 Gastrointestinal hemorrhage, unspecified: Secondary | ICD-10-CM

## 2020-10-19 DIAGNOSIS — C158 Malignant neoplasm of overlapping sites of esophagus: Secondary | ICD-10-CM

## 2020-10-19 DIAGNOSIS — Z510 Encounter for antineoplastic radiation therapy: Secondary | ICD-10-CM | POA: Diagnosis not present

## 2020-10-19 LAB — SAMPLE TO BLOOD BANK

## 2020-10-19 LAB — CBC WITH DIFFERENTIAL/PLATELET
Abs Immature Granulocytes: 0.03 10*3/uL (ref 0.00–0.07)
Basophils Absolute: 0 10*3/uL (ref 0.0–0.1)
Basophils Relative: 1 %
Eosinophils Absolute: 0 10*3/uL (ref 0.0–0.5)
Eosinophils Relative: 1 %
HCT: 33.7 % — ABNORMAL LOW (ref 39.0–52.0)
Hemoglobin: 10.8 g/dL — ABNORMAL LOW (ref 13.0–17.0)
Immature Granulocytes: 2 %
Lymphocytes Relative: 5 %
Lymphs Abs: 0.1 10*3/uL — ABNORMAL LOW (ref 0.7–4.0)
MCH: 26.1 pg (ref 26.0–34.0)
MCHC: 32 g/dL (ref 30.0–36.0)
MCV: 81.4 fL (ref 80.0–100.0)
Monocytes Absolute: 0.3 10*3/uL (ref 0.1–1.0)
Monocytes Relative: 15 %
Neutro Abs: 1.5 10*3/uL — ABNORMAL LOW (ref 1.7–7.7)
Neutrophils Relative %: 76 %
Platelets: 196 10*3/uL (ref 150–400)
RBC: 4.14 MIL/uL — ABNORMAL LOW (ref 4.22–5.81)
RDW: 21.2 % — ABNORMAL HIGH (ref 11.5–15.5)
WBC: 2 10*3/uL — ABNORMAL LOW (ref 4.0–10.5)
nRBC: 0 % (ref 0.0–0.2)

## 2020-10-19 LAB — CMP (CANCER CENTER ONLY)
ALT: 19 U/L (ref 0–44)
AST: 21 U/L (ref 15–41)
Albumin: 2.9 g/dL — ABNORMAL LOW (ref 3.5–5.0)
Alkaline Phosphatase: 215 U/L — ABNORMAL HIGH (ref 38–126)
Anion gap: 9 (ref 5–15)
BUN: 7 mg/dL — ABNORMAL LOW (ref 8–23)
CO2: 29 mmol/L (ref 22–32)
Calcium: 8.6 mg/dL — ABNORMAL LOW (ref 8.9–10.3)
Chloride: 96 mmol/L — ABNORMAL LOW (ref 98–111)
Creatinine: 0.67 mg/dL (ref 0.61–1.24)
GFR, Estimated: >60 mL/min (ref >60)
Glucose, Bld: 115 mg/dL — ABNORMAL HIGH (ref 70–99)
Potassium: 3.5 mmol/L (ref 3.5–5.1)
Sodium: 134 mmol/L — ABNORMAL LOW (ref 135–145)
Total Bilirubin: 0.5 mg/dL (ref 0.3–1.2)
Total Protein: 6.2 g/dL — ABNORMAL LOW (ref 6.5–8.1)

## 2020-10-19 LAB — MAGNESIUM: Magnesium: 1.6 mg/dL — ABNORMAL LOW (ref 1.7–2.4)

## 2020-10-19 MED ORDER — FENTANYL 12 MCG/HR TD PT72: 2.0000 | MEDICATED_PATCH | TRANSDERMAL | 0 refills | Status: DC

## 2020-10-19 MED ORDER — DRONABINOL 2.5 MG PO CAPS: 2.5000 mg | ORAL_CAPSULE | Freq: Two times a day (BID) | ORAL | 0 refills | Status: DC

## 2020-10-19 MED ORDER — ONDANSETRON HCL 8 MG PO TABS: 8.0000 mg | ORAL_TABLET | Freq: Two times a day (BID) | ORAL | 1 refills | Status: DC | PRN

## 2020-10-19 NOTE — Progress Notes (Signed)
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 He did have esophagitis due to treatment. He has lost about 24 pounds since starting treatment, and has declined PEG placement. It is unclear if he will be a candidate for further resection.  Plan: The patient will receive a call in about one month from the radiation oncology department. He will continue follow up with Dr. Irene Limbo as well to continue.   ________________________________________________    Carola Rhine, [facility]

## 2020-10-19 NOTE — Telephone Encounter (Signed)
Opened in error

## 2020-10-20 ENCOUNTER — Inpatient Hospital Stay: Payer: No Typology Code available for payment source

## 2020-10-20 ENCOUNTER — Ambulatory Visit: Payer: No Typology Code available for payment source

## 2020-10-20 ENCOUNTER — Other Ambulatory Visit: Payer: Self-pay

## 2020-10-20 VITALS — BP 158/89 | HR 76 | Temp 98.0°F | Resp 16

## 2020-10-20 DIAGNOSIS — Z510 Encounter for antineoplastic radiation therapy: Secondary | ICD-10-CM | POA: Diagnosis not present

## 2020-10-20 DIAGNOSIS — D500 Iron deficiency anemia secondary to blood loss (chronic): Secondary | ICD-10-CM

## 2020-10-20 DIAGNOSIS — C158 Malignant neoplasm of overlapping sites of esophagus: Secondary | ICD-10-CM

## 2020-10-20 MED ORDER — SODIUM CHLORIDE 0.9 % IV SOLN
Freq: Once | INTRAVENOUS | Status: AC
  Filled 2020-10-20: qty 250

## 2020-10-20 MED ORDER — HEPARIN SOD (PORK) LOCK FLUSH 100 UNIT/ML IV SOLN
500.0000 [IU] | Freq: Once | INTRAVENOUS | Status: AC | PRN
  Administered 2020-10-20: 500 [IU]
  Filled 2020-10-20: qty 5

## 2020-10-20 MED ORDER — SODIUM CHLORIDE 0.9% FLUSH
10.0000 mL | Freq: Once | INTRAVENOUS | Status: AC | PRN
  Administered 2020-10-20: 10 mL
  Filled 2020-10-20: qty 10

## 2020-10-20 NOTE — Patient Instructions (Signed)
Dehydration, Adult Dehydration is condition in which there is not enough water or other fluids in the body. This happens when a person loses more fluids than he or she takes in. Important body parts cannot work right without the right amount of fluids. Any loss of fluids from the body can cause dehydration. Dehydration can be mild, worse, or very bad. It should be treated right away to keep it from getting very bad. What are the causes? This condition may be caused by:  Conditions that cause loss of water or other fluids, such as: ? Watery poop (diarrhea). ? Vomiting. ? Sweating a lot. ? Peeing (urinating) a lot.  Not drinking enough fluids, especially when you: ? Are ill. ? Are doing things that take a lot of energy to do.  Other illnesses and conditions, such as fever or infection.  Certain medicines, such as medicines that take extra fluid out of the body (diuretics).  Lack of safe drinking water.  Not being able to get enough water and food. What increases the risk? The following factors may make you more likely to develop this condition:  Having a long-term (chronic) illness that has not been treated the right way, such as: ? Diabetes. ? Heart disease. ? Kidney disease.  Being 65 years of age or older.  Having a disability.  Living in a place that is high above the ground or sea (high in altitude). The thinner, dried air causes more fluid loss.  Doing exercises that put stress on your body for a long time. What are the signs or symptoms? Symptoms of dehydration depend on how bad it is. Mild or worse dehydration  Thirst.  Dry lips or dry mouth.  Feeling dizzy or light-headed, especially when you stand up from sitting.  Muscle cramps.  Your body making: ? Dark pee (urine). Pee may be the color of tea. ? Less pee than normal. ? Less tears than normal.  Headache. Very bad dehydration  Changes in skin. Skin may: ? Be cold to the touch (clammy). ? Be blotchy  or pale. ? Not go back to normal right after you lightly pinch it and let it go.  Little or no tears, pee, or sweat.  Changes in vital signs, such as: ? Fast breathing. ? Low blood pressure. ? Weak pulse. ? Pulse that is more than 100 beats a minute when you are sitting still.  Other changes, such as: ? Feeling very thirsty. ? Eyes that look hollow (sunken). ? Cold hands and feet. ? Being mixed up (confused). ? Being very tired (lethargic) or having trouble waking from sleep. ? Short-term weight loss. ? Loss of consciousness. How is this treated? Treatment for this condition depends on how bad it is. Treatment should start right away. Do not wait until your condition gets very bad. Very bad dehydration is an emergency. You will need to go to a hospital.  Mild or worse dehydration can be treated at home. You may be asked to: ? Drink more fluids. ? Drink an oral rehydration solution (ORS). This drink helps get the right amounts of fluids and salts and minerals in the blood (electrolytes).  Very bad dehydration can be treated: ? With fluids through an IV tube. ? By getting normal levels of salts and minerals in your blood. This is often done by giving salts and minerals through a tube. The tube is passed through your nose and into your stomach. ? By treating the root cause. Follow these instructions at   home: Oral rehydration solution If told by your doctor, drink an ORS:  Make an ORS. Use instructions on the package.  Start by drinking small amounts, about  cup (120 mL) every 5-10 minutes.  Slowly drink more until you have had the amount that your doctor said to have. Eating and drinking  Drink enough clear fluid to keep your pee pale yellow. If you were told to drink an ORS, finish the ORS first. Then, start slowly drinking other clear fluids. Drink fluids such as: ? Water. Do not drink only water. Doing that can make the salt (sodium) level in your body get too low. ? Water  from ice chips you suck on. ? Fruit juice that you have added water to (diluted). ? Low-calorie sports drinks.  Eat foods that have the right amounts of salts and minerals, such as: ? Bananas. ? Oranges. ? Potatoes. ? Tomatoes. ? Spinach.  Do not drink alcohol.  Avoid: ? Drinks that have a lot of sugar. These include:  High-calorie sports drinks.  Fruit juice that you did not add water to.  Soda.  Caffeine. ? Foods that are greasy or have a lot of fat or sugar.         General instructions  Take over-the-counter and prescription medicines only as told by your doctor.  Do not take salt tablets. Doing that can make the salt level in your body get too high.  Return to your normal activities as told by your doctor. Ask your doctor what activities are safe for you.  Keep all follow-up visits as told by your doctor. This is important. Contact a doctor if:  You have pain in your belly (abdomen) and the pain: ? Gets worse. ? Stays in one place.  You have a rash.  You have a stiff neck.  You get angry or annoyed (irritable) more easily than normal.  You are more tired or have a harder time waking than normal.  You feel: ? Weak or dizzy. ? Very thirsty. Get help right away if you have:  Any symptoms of very bad dehydration.  Symptoms of vomiting, such as: ? You cannot eat or drink without vomiting. ? Your vomiting gets worse or does not go away. ? Your vomit has blood or green stuff in it.  Symptoms that get worse with treatment.  A fever.  A very bad headache.  Problems with peeing or pooping (having a bowel movement), such as: ? Watery poop that gets worse or does not go away. ? Blood in your poop (stool). This may cause poop to look black and tarry. ? Not peeing in 6-8 hours. ? Peeing only a small amount of very dark pee in 6-8 hours.  Trouble breathing. These symptoms may be an emergency. Do not wait to see if the symptoms will go away. Get  medical help right away. Call your local emergency services (911 in the [place] Do not drive yourself to the hospital. Summary  Dehydration is a condition in which there is not enough water or other fluids in the body. This happens when a person loses more fluids than he or she takes in.  Treatment for this condition depends on how bad it is. Treatment should be started right away. Do not wait until your condition gets very bad.  Drink enough clear fluid to keep your pee pale yellow. If you were told to drink an oral rehydration solution (ORS), finish the ORS first. Then, start slowly drinking other clear fluids.    Take over-the-counter and prescription medicines only as told by your doctor.  Get help right away if you have any symptoms of very bad dehydration. This information is not intended to replace advice given to you by your health care provider. Make sure you discuss any questions you have with your health care provider. Document Revised: 04/08/2019 Document Reviewed: 04/08/2019 Elsevier Patient Education  2021 Elsevier Inc.  

## 2020-10-23 ENCOUNTER — Ambulatory Visit: Payer: No Typology Code available for payment source

## 2020-10-23 ENCOUNTER — Inpatient Hospital Stay: Payer: No Typology Code available for payment source | Admitting: Nutrition

## 2020-10-23 ENCOUNTER — Other Ambulatory Visit: Payer: Self-pay

## 2020-10-23 ENCOUNTER — Inpatient Hospital Stay: Payer: No Typology Code available for payment source

## 2020-10-23 ENCOUNTER — Ambulatory Visit: Payer: No Typology Code available for payment source | Admitting: Radiation Oncology

## 2020-10-23 VITALS — BP 154/86 | HR 94 | Temp 98.2°F | Resp 18 | Ht 72.0 in | Wt 168.5 lb

## 2020-10-23 DIAGNOSIS — Z510 Encounter for antineoplastic radiation therapy: Secondary | ICD-10-CM | POA: Diagnosis not present

## 2020-10-23 DIAGNOSIS — C158 Malignant neoplasm of overlapping sites of esophagus: Secondary | ICD-10-CM

## 2020-10-23 DIAGNOSIS — D500 Iron deficiency anemia secondary to blood loss (chronic): Secondary | ICD-10-CM

## 2020-10-23 MED ORDER — SODIUM CHLORIDE 0.9% FLUSH
10.0000 mL | Freq: Once | INTRAVENOUS | Status: AC | PRN
  Administered 2020-10-23: 10 mL
  Filled 2020-10-23: qty 10

## 2020-10-23 MED ORDER — HEPARIN SOD (PORK) LOCK FLUSH 100 UNIT/ML IV SOLN
500.0000 [IU] | Freq: Once | INTRAVENOUS | Status: AC | PRN
  Administered 2020-10-23: 500 [IU]
  Filled 2020-10-23: qty 5

## 2020-10-23 MED ORDER — SODIUM CHLORIDE 0.9 % IV SOLN
Freq: Once | INTRAVENOUS | Status: AC
  Filled 2020-10-23: qty 250

## 2020-10-23 NOTE — Progress Notes (Signed)
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 Painful swallowing continues resulting in inadequate oral intake. He has additional pain medication but has not been taking it. He is frustrated with weight loss but endorses he has not been able to eat and drink well. Weight today documented as 168 pounds down from 175.3 pounds February 10. He is eating small bites. Complains of increased phlegm and mucus production. States he doesn't want to vomit. He keeps hoping his throat will feel better. Refuses to talk about feeding tube.  Estimated nutrition needs: 2200-2400 cal, 110-130 g protein, 2.2 L fluid.  Nutrition diagnosis: Inadequate oral intake continues.  Intervention: Educated patient he requires 6 cartons of Ensure Plus daily to provide 2100 cal and 89.4 g protein.  Encouraged him to drink 1 carton every 3 hours. Continue yogurt and other soft foods as tolerated. Take pain medication as prescribed including lidocaine. Recommend baking soda and salt water rinses. Recommend feeding tube placement to support healing.  Monitoring, evaluation, goals: Increase calories and protein to minimize weight loss.  Next visit: Encourage patient to contact me for questions.  Schedule nutrition follow-up as needed.  **Disclaimer: This note was dictated with voice recognition software. Similar sounding words can inadvertently be transcribed and this note may contain transcription errors which may not have been corrected upon publication of note.**

## 2020-10-23 NOTE — Patient Instructions (Signed)
Rehydration, Adult Rehydration is the replacement of body fluids, salts, and minerals (electrolytes) that are lost during dehydration. Dehydration is when there is not enough water or other fluids in the body. This happens when you lose more fluids than you take in. Common causes of dehydration include:  Not drinking enough fluids. This can occur when you are ill or doing activities that require a lot of energy, especially in hot weather.  Conditions that cause loss of water or other fluids, such as diarrhea, vomiting, sweating, or urinating a lot.  Other illnesses, such as fever or infection.  Certain medicines, such as those that remove excess fluid from the body (diuretics). Symptoms of mild or moderate dehydration may include thirst, dry lips and mouth, and dizziness. Symptoms of severe dehydration may include increased heart rate, confusion, fainting, and not urinating. For severe dehydration, you may need to get fluids through an IV at the hospital. For mild or moderate dehydration, you can usually rehydrate at home by drinking certain fluids as told by your health care provider. What are the risks? Generally, rehydration is safe. However, taking in too much fluid (overhydration) can be a problem. This is rare. Overhydration can cause an electrolyte imbalance, kidney failure, or a decrease in salt (sodium) levels in the body. Supplies needed You will need an oral rehydration solution (ORS) if your health care provider tells you to use one. This is a drink to treat dehydration. It can be found in pharmacies and retail stores. How to rehydrate Fluids Follow instructions from your health care provider for rehydration. The kind of fluid and the amount you should drink depend on your condition. In general, you should choose drinks that you prefer.  If told by your health care provider, drink an ORS. ? Make an ORS by following instructions on the package. ? Start by drinking small amounts,  about  cup (120 mL) every 5-10 minutes. ? Slowly increase how much you drink until you have taken the amount recommended by your health care provider.  Drink enough clear fluids to keep your urine pale yellow. If you were told to drink an ORS, finish it first, then start slowly drinking other clear fluids. Drink fluids such as: ? Water. This includes sparkling water and flavored water. Drinking only water can lead to having too little sodium in your body (hyponatremia). Follow the advice of your health care provider. ? Water from ice chips you suck on. ? Fruit juice with water you add to it (diluted). ? Sports drinks. ? Hot or cold herbal teas. ? Broth-based soups. ? Milk or milk products. Food Follow instructions from your health care provider about what to eat while you rehydrate. Your health care provider may recommend that you slowly begin eating regular foods in small amounts.  Eat foods that contain a healthy balance of electrolytes, such as bananas, oranges, potatoes, tomatoes, and spinach.  Avoid foods that are greasy or contain a lot of sugar. In some cases, you may get nutrition through a feeding tube that is passed through your nose and into your stomach (nasogastric tube, or NG tube). This may be done if you have uncontrolled vomiting or diarrhea.   Beverages to avoid Certain beverages may make dehydration worse. While you rehydrate, avoid drinking alcohol.   How to tell if you are recovering from dehydration You may be recovering from dehydration if:  You are urinating more often than before you started rehydrating.  Your urine is pale yellow.  Your energy level   improves.  You vomit less frequently.  You have diarrhea less frequently.  Your appetite improves or returns to normal.  You feel less dizzy or less light-headed.  Your skin tone and color start to look more normal. Follow these instructions at home:  Take over-the-counter and prescription medicines only  as told by your health care provider.  Do not take sodium tablets. Doing this can lead to having too much sodium in your body (hypernatremia). Contact a health care provider if:  You continue to have symptoms of mild or moderate dehydration, such as: ? Thirst. ? Dry lips. ? Slightly dry mouth. ? Dizziness. ? Dark urine or less urine than normal. ? Muscle cramps.  You continue to vomit or have diarrhea. Get help right away if you:  Have symptoms of dehydration that get worse.  Have a fever.  Have a severe headache.  Have been vomiting and the following happens: ? Your vomiting gets worse or does not go away. ? Your vomit includes blood or green matter (bile). ? You cannot eat or drink without vomiting.  Have problems with urination or bowel movements, such as: ? Diarrhea that gets worse or does not go away. ? Blood in your stool (feces). This may cause stool to look black and tarry. ? Not urinating, or urinating only a small amount of very dark urine, within 6-8 hours.  Have trouble breathing.  Have symptoms that get worse with treatment. These symptoms may represent a serious problem that is an emergency. Do not wait to see if the symptoms will go away. Get medical help right away. Call your local emergency services (911 in the [place] Do not drive yourself to the hospital. Summary  Rehydration is the replacement of body fluids and minerals (electrolytes) that are lost during dehydration.  Follow instructions from your health care provider for rehydration. The kind of fluid and amount you should drink depend on your condition.  Slowly increase how much you drink until you have taken the amount recommended by your health care provider.  Contact your health care provider if you continue to show signs of mild or moderate dehydration. This information is not intended to replace advice given to you by your health care provider. Make sure you discuss any questions you have with  your health care provider. Document Revised: 10/27/2019 Document Reviewed: 09/06/2019 Elsevier Patient Education  2021 Elsevier Inc.  

## 2020-10-24 ENCOUNTER — Ambulatory Visit: Payer: No Typology Code available for payment source

## 2020-10-24 ENCOUNTER — Telehealth: Payer: Self-pay | Admitting: Hematology

## 2020-10-24 NOTE — Telephone Encounter (Signed)
Contacted patient about upcoming apt's on 02/17,02/21,02/24. Patient is aware.

## 2020-10-25 ENCOUNTER — Ambulatory Visit: Payer: No Typology Code available for payment source

## 2020-10-26 ENCOUNTER — Other Ambulatory Visit: Payer: Self-pay

## 2020-10-26 ENCOUNTER — Inpatient Hospital Stay: Payer: No Typology Code available for payment source

## 2020-10-26 VITALS — BP 108/81 | HR 117 | Temp 98.4°F | Resp 18

## 2020-10-26 DIAGNOSIS — D500 Iron deficiency anemia secondary to blood loss (chronic): Secondary | ICD-10-CM

## 2020-10-26 DIAGNOSIS — C158 Malignant neoplasm of overlapping sites of esophagus: Secondary | ICD-10-CM

## 2020-10-26 DIAGNOSIS — Z510 Encounter for antineoplastic radiation therapy: Secondary | ICD-10-CM | POA: Diagnosis not present

## 2020-10-26 MED ORDER — SODIUM CHLORIDE 0.9% FLUSH
10.0000 mL | Freq: Once | INTRAVENOUS | Status: AC | PRN
  Administered 2020-10-26: 10 mL
  Filled 2020-10-26: qty 10

## 2020-10-26 MED ORDER — HEPARIN SOD (PORK) LOCK FLUSH 100 UNIT/ML IV SOLN
500.0000 [IU] | Freq: Once | INTRAVENOUS | Status: AC | PRN
  Administered 2020-10-26: 500 [IU]
  Filled 2020-10-26: qty 5

## 2020-10-26 MED ORDER — SODIUM CHLORIDE 0.9 % IV SOLN
Freq: Once | INTRAVENOUS | Status: AC
  Filled 2020-10-26: qty 250

## 2020-10-27 ENCOUNTER — Other Ambulatory Visit: Payer: Self-pay | Admitting: *Deleted

## 2020-10-27 DIAGNOSIS — K922 Gastrointestinal hemorrhage, unspecified: Secondary | ICD-10-CM

## 2020-10-27 DIAGNOSIS — C158 Malignant neoplasm of overlapping sites of esophagus: Secondary | ICD-10-CM

## 2020-10-27 DIAGNOSIS — D500 Iron deficiency anemia secondary to blood loss (chronic): Secondary | ICD-10-CM

## 2020-10-29 NOTE — Progress Notes (Signed)
HEMATOLOGY/ONCOLOGY CLINIC NOTE  Date of Service: 10/29/2020  Patient Care Team: Berkley Harvey, NP as PCP - General (Nurse Practitioner) Brunetta Genera, MD as Consulting Physician (Hematology) Jonnie Finner, RN as Oncology Nurse Navigator  CHIEF COMPLAINTS/PURPOSE OF CONSULTATION:  GEJ Adenocarcinoma   HISTORY OF PRESENTING ILLNESS:  Donald Wade is a wonderful 65 y.o. 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 Pt is accompanied today by his wife, Donald Wade. The pt reports that he is doing well overall.  The pt reports that he feels better today than he has in a month. Pt was seen by Vascular Surgery for his Peripheral Artery Disease and was placed on Plavix and Cilostazol in September. Pt began having black, tarry stools about 1.5 months ago that he thought was medication-related. He became very lightheaded and dizzy prior to his his hospital admission on 08/21/2020. Pt was given 2 units of PRBC and IV Iron in the hospital. Pt was placed on Protonix twice per day and was taken off of Plavix and Cilostazol prior to discharge.   Pt denies any difficulty swallowing and is taking measures to eat small bites. He has had noticeable acid reflux in the last three months that has worsened recently.   He has Hypertension and Hyperlipidemia. He denies any history of COPD, Emphysema, Allergies, Diabetes, or ongoing heart issues. Pt is up to date with his Colonoscopies and has never had any Prostate issues.    Pt has had Upper Endoscopy completed on 08/22/2020 with results revealing "- Likely malignant esophageal tumor was found in the lower third of the esophagus and at the gastroesophageal junction. Biopsied. 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 - Normal gastric body, antrum and prepyloric region of the stomach. - Normal duodenal bulb, first portion of the duodenum, second portion of the duodenum and third  portion of the duodenum. - The examination was otherwise normal."  Of note prior to the patient's visit today, pt has had Esophageal/Stomach Mass Surgical Pathology [phone]) completed on 08/22/2020 with results revealing "A. ESOPHAGUS, DISTAL MASS, BIOPSY:  - Poorly differentiated adenocarcinoma with extracellular mucin and  signet ring cells. B. STOMACH, MASS, BIOPSY: - Poorly differentiated adenocarcinoma with extracellular mucin and  signet ring cells."   Pt has had CT C/A/P ([id number]) ([id number]) completed on 08/22/2020 with results revealing "1. Thickened appearance of the wall of the stomach adjacent to the gastroesophageal junction as well as proximal gastric wall concerning for infiltrative neoplasm. 2. Rounded gastrohepatic lymph nodes as well as mildly enlarged retroperitoneal lymph nodes concerning for early nodal metastatic disease. 3. No evidence of metastatic disease in the chest. 4. Colonic diverticulosis. 5. Aortic Atherosclerosis."  Most recent lab results (08/23/2020) of CBC is as follows: all values are WNL except for WBC at 3.9K, RBC at 2.93, Hgb at 7.6, HCT at 25.1, MCH at 25.9, RDW at 16.4, Glucose at 101, Calcium at 8.4, Total Protein at 5.8, Albumin at 3.2.  On review of systems, pt denies dysphagia, SOB, abdominal pain, dysuria, back pain, leg swelling and any other symptoms.   On PMHx the pt reports PAD, HTN, HLD, Appendectomy, Left Heart Cath. On Social Hx the pt reports that he is a non-smoker. He drinks 3-4 beers per day on average. Pt denies any other recreational drug use.  On Family Hx the pt reports that his mother had Colon Cancer in her mid-60's and his father had Throat Cancer after an extensive  history of cigar smoking.   INTERVAL HISTORY  Donald Wade was seen in follow-up for toxicity check and clinical evaluation prior to completion of chemotherapy and his last radiation treatment on 10/18/2020. The patient's last visit with [place] was on 10/19/2020. He  reports he is doing well overall. We are joined today by his wife.  The pt reports his symptoms are well controlled. He notes some improvement in his po intake and po fluid intake. No fevers/chills. Some decrease in throat and esophagealpain.  Lab results today 10/30/2020 of CBC w/diff and CMP reviewed with patient.  On review of systems, pt reports no other symptoms.  MEDICAL HISTORY:  Past Medical History:  Diagnosis Date  . Anemia 08/22/2020  . Heart murmur   . Hyperlipidemia   . Hypertension     SURGICAL HISTORY: Past Surgical History:  Procedure Laterality Date  . APPENDECTOMY  1980  . BIOPSY  08/22/2020   Procedure: BIOPSY;  Surgeon: Clarene Essex, MD;  Location: [facility] ENDOSCOPY;  Service: Endoscopy;;  . ESOPHAGOGASTRODUODENOSCOPY  08/22/2020  . ESOPHAGOGASTRODUODENOSCOPY N/A 08/22/2020   Procedure: ESOPHAGOGASTRODUODENOSCOPY (EGD);  Surgeon: Clarene Essex, MD;  Location: [facility];  Service: Endoscopy;  Laterality: N/A;  . IR IMAGING GUIDED PORT INSERTION  09/07/2020  . LEFT HEART CATH AND CORONARY ANGIOGRAPHY N/A 11/23/2019   Procedure: LEFT HEART CATH AND CORONARY ANGIOGRAPHY;  Surgeon: Nigel Mormon, MD;  Location: [facility];  Service: Cardiovascular;  Laterality: N/A;    SOCIAL HISTORY: Social History   Socioeconomic History  . Marital status: Married    Spouse name: Not on file  . Number of children: 2  . Years of education: Not on file  . Highest education level: Not on file  Occupational History  . Not on file  Tobacco Use  . Smoking status: Never Smoker  . Smokeless tobacco: Never Used  Vaping Use  . Vaping Use: Never used  Substance and Sexual Activity  . Alcohol use: Yes    Comment: 2 beers daily  . Drug use: Never  . Sexual activity: Not on file  Other Topics Concern  . Not on file  Social History Narrative  . Not on file   Social Determinants of Health   Financial Resource Strain: Not on file  Food Insecurity: Not on file   Transportation Needs: Not on file  Physical Activity: Not on file  Stress: Not on file  Social Connections: Not on file  Intimate Partner Violence: Not on file    FAMILY HISTORY: Family History  Problem Relation Age of Onset  . Heart disease Mother   . Atrial fibrillation Mother     ALLERGIES:  is allergic to bee venom and penicillin g.  MEDICATIONS:  Current Outpatient Medications  Medication Sig Dispense Refill  . acetaminophen (TYLENOL) 325 MG tablet Take 650 mg by mouth every 6 (six) hours as needed for mild pain.    Marland Kitchen buPROPion (WELLBUTRIN XL) 150 MG 24 hr tablet Take 150 mg by mouth daily.     Marland Kitchen dexamethasone (DECADRON) 4 MG tablet Take 2 tablets (8 mg total) by mouth daily. Start the day after chemotherapy for 2 days. 30 tablet 1  . dronabinol (MARINOL) 2.5 MG capsule Take 1 capsule (2.5 mg total) by mouth 2 (two) times daily before a meal. 60 capsule 0  . EPINEPHrine 0.3 mg/0.3 mL IJ SOAJ injection Inject 0.3 mg into the skin as needed for anaphylaxis (call 911).    . feeding supplement (ENSURE ENLIVE / ENSURE  PLUS) LIQD Take 237 mLs by mouth 2 (two) times daily between meals. 237 mL 12  . fentaNYL (DURAGESIC) 12 MCG/HR Place 2 patches onto the skin every 3 (three) days. 20 patch 0  . fexofenadine (ALLEGRA) 180 MG tablet Take 180 mg by mouth daily as needed for allergies.    Marland Kitchen HYDROmorphone (DILAUDID) 2 MG tablet Take 1 tablet (2 mg total) by mouth every 6 (six) hours as needed for severe pain. 60 tablet 0  . lidocaine-prilocaine (EMLA) cream Apply to affected area once 30 g 3  . LORazepam (ATIVAN) 0.5 MG tablet Take 1 tablet (0.5 mg total) by mouth every 6 (six) hours as needed (Nausea or vomiting). 30 tablet 0  . magic mouthwash w/lidocaine SOLN 1 Part viscous lidocaine 2% 1 Part Maalox 1 Part diphenhydramine 12.5 mg per 5 ml elixir  Use 27ml swish and swallow four times a day as needed for throat or esophageal pain from radiation. 200 mL 1  . metoprolol succinate  (TOPROL XL) 50 MG 24 hr tablet Take 1 tablet (50 mg total) by mouth daily. (Patient taking differently: Take 25 mg by mouth daily.) 90 tablet 2  . Multiple Vitamin (MULTI-VITAMIN) tablet Take 1 tablet by mouth daily.    . Omega-3 1000 MG CAPS Take 1,000 mg by mouth once a week.    . ondansetron (ZOFRAN) 8 MG tablet Take 1 tablet (8 mg total) by mouth 2 (two) times daily as needed for refractory nausea / vomiting. Start on day 3 after chemo. 30 tablet 1  . pantoprazole (PROTONIX) 40 MG tablet Take 1 tablet (40 mg total) by mouth 2 (two) times daily. 60 tablet 2  . polyethylene glycol (MIRALAX / GLYCOLAX) 17 g packet Take 17 g by mouth daily as needed for mild constipation. 14 each 0  . predniSONE (DELTASONE) 5 MG tablet 6 tab x 1 day, 5 tab x 1 day, 4 tab x 1 day, 3 tab x 1 day, 2 tab x 1 day, 1 tab x 1 day, stop 21 tablet 0  . prochlorperazine (COMPAZINE) 10 MG tablet Take 1 tablet (10 mg total) by mouth every 6 (six) hours as needed (Nausea or vomiting). 30 tablet 1  . sucralfate (CARAFATE) 1 GM/10ML suspension Take 10 mLs (1 g total) by mouth 2 (two) times daily. 420 mL 1  . triamcinolone lotion (KENALOG) 0.1 % Apply 1 application topically 3 (three) times daily. 120 mL 3  . vitamin B-12 (CYANOCOBALAMIN) 1000 MCG tablet Take 1,000 mcg by mouth daily.     No current facility-administered medications for this visit.    REVIEW OF SYSTEMS:   10 Point review of Systems was done is negative except as noted above.   PHYSICAL EXAMINATION: ECOG PERFORMANCE STATUS: 1 - Symptomatic but completely ambulatory .NAD GENERAL:alert, in no acute distress and comfortable SKIN: no acute rashes, no significant lesions EYES: conjunctiva are pink and non-injected, sclera anicteric OROPHARYNX: MMM, no exudates, no oropharyngeal erythema or ulceration NECK: supple, no JVD LYMPH:  no palpable lymphadenopathy in the cervical, axillary or inguinal regions LUNGS: clear to auscultation b/l with normal respiratory  effort HEART: regular rate & rhythm ABDOMEN:  normoactive bowel sounds , non tender, not distended. Extremity: no pedal edema PSYCH: alert & oriented x 3 with fluent speech NEURO: no focal motor/sensory deficits   LABORATORY DATA:  I have reviewed the data as listed  CBC Latest Ref Rng & Units 10/30/2020 10/19/2020 10/09/2020  WBC 4.0 - 10.5 K/uL 3.9(L) 2.0(L) 0.9(LL)  Hemoglobin 13.0 - 17.0 g/dL 10.2(L) 10.8(L) 10.4(L)  Hematocrit 39.0 - 52.0 % 30.6(L) 33.7(L) 31.0(L)  Platelets 150 - 400 K/uL 235 196 73(L)  ANC 1.5k  . CMP Latest Ref Rng & Units 10/30/2020 10/19/2020 10/09/2020  Glucose 70 - 99 mg/dL 116(H) 115(H) 99  BUN 8 - 23 mg/dL 8 7(L) 10  Creatinine 0.61 - 1.24 mg/dL 0.63 0.67 0.60(L)  Sodium 135 - 145 mmol/L 134(L) 134(L) 133(L)  Potassium 3.5 - 5.1 mmol/L 4.1 3.5 3.3(L)  Chloride 98 - 111 mmol/L 98 96(L) 99  CO2 22 - 32 mmol/L 26 29 25   Calcium 8.9 - 10.3 mg/dL 8.7(L) 8.6(L) 8.5(L)  Total Protein 6.5 - 8.1 g/dL 6.1(L) 6.2(L) 6.0(L)  Total Bilirubin 0.3 - 1.2 mg/dL 0.4 0.5 0.7  Alkaline Phos 38 - 126 U/L 132(H) 215(H) 386(H)  AST 15 - 41 U/L 29 21 102(H)  ALT 0 - 44 U/L 19 19 144(H)    08/22/2020 Esophageal/Stomach Mass Surgical Pathology [phone]):   RADIOGRAPHIC STUDIES: I have personally reviewed the radiological images as listed and agreed with the findings in the report. US Abdomen Complete  Result Date: 10/11/2020 CLINICAL DATA:  Abnormal liver function studies. EXAM: ABDOMEN ULTRASOUND COMPLETE COMPARISON:  PET-CT 08/31/2020 FINDINGS: Gallbladder: No gallstones or wall thickening visualized. No sonographic Murphy sign noted by sonographer. Common bile duct: Diameter: 6.6 mm Liver: Normal echogenicity without focal lesion or biliary dilatation. Portal vein is patent on color Doppler imaging with normal direction of blood flow towards the liver. IVC: Poorly visualized. Pancreas: Poorly visualized Spleen: Normal size.  No focal lesions. Right Kidney: Length:  12.7 cm. Normal renal cortical thickness and echogenicity without focal lesions or hydronephrosis. Left Kidney: Length: 11.7 cm. Normal renal cortical thickness and echogenicity without focal lesions or hydronephrosis. Abdominal aorta: Normal caliber. Other findings: None. IMPRESSION: 1. Limited examination due to overlying bowel gas. 2. Normal gallbladder and normal liver.  No biliary dilatation. 3. Poor visualization of the IVC and pancreas. 4. Normal spleen and kidneys. Electronically Signed   By: Marijo Sanes M.D.   On: 10/11/2020 14:24    ASSESSMENT & PLAN:   65 yo male with no significant smoking or tobacco use h/o but with chronic reflux and etoh use with   1) Newly diagnosed GE junction poorly differentiated with extracellular mucin and signet ring cells. [id number] No overt obstructive symptoms at this time Esophageal/Stomach Mass Surgical Pathology [phone]) which revealed "A. ESOPHAGUS, DISTAL MASS, BIOPSY:  - Poorly differentiated adenocarcinoma with extracellular mucin and  signet ring cells. B. STOMACH, MASS, BIOPSY: - Poorly differentiated adenocarcinoma with extracellular mucin and signet ring cells."  PET/CT 08/31/2020- Wall thickening/mass centered in the gastric cardia, compatible with the patient's known primary neoplasm. This predominantly involves the proximal stomach rather than the distal esophagus. Small gastrohepatic and para-aortic nodes, suspicious for nodal metastases  2) s/p GI bleeding- melena with symptomatic anemia s/p PRBC transfusion.  No further evidence of GI bleeding at this time. 3) Iron deficiency anemia from GI bleeding - additional IV iron ordered 4) Abnormal LFTs -- resolved 5) Radiation mucositis   PLAN: --Discussed pt labwork today, 10/30/2020; discussed. Stable. - no further GI bleeding reported by patient. -Will increase Fentanyl patch and refill. Continue to monitor within the next weeks. -Advised pt we will get repeat PET 10 weeks following  radiation completion. -Recommend pt continue to eat well, drink 48-64 oz water daily, and walk 20-30 min daily. Recommend pt get proper sleep. -Continue Marinol as same as current at  this time. Will not increase the dosage. -Continue Metoprolol. Hold Lipitor. -Continue salt and baking soda mouth washes prn. -Continue IVF as per plan.  FOLLOW UP: RTC with Dr Irene Limbo with portflush and labs in 3 weeks   All of the patients questions were answered with apparent satisfaction. The patient knows to call the clinic with any problems, questions or concerns.   The total time spent in the appointment was 20 minutes and more than 50% was on counseling and direct patient cares.  Sullivan Lone MD [place] AAHIVMS [facility] Hematology/Oncology Physician [facility]  (Office):       [phone] (Work cell):  [phone] (Fax):           [phone]  I, Reinaldo Raddle, am acting as scribe for Dr. Sullivan Lone, MD.   .I have reviewed the above documentation for accuracy and completeness, and I agree with the above. Brunetta Genera MD

## 2020-10-30 ENCOUNTER — Inpatient Hospital Stay: Payer: No Typology Code available for payment source

## 2020-10-30 ENCOUNTER — Inpatient Hospital Stay: Payer: No Typology Code available for payment source | Admitting: Hematology

## 2020-10-30 ENCOUNTER — Other Ambulatory Visit: Payer: Self-pay

## 2020-10-30 VITALS — BP 127/76 | HR 89 | Temp 97.8°F | Resp 17

## 2020-10-30 DIAGNOSIS — D500 Iron deficiency anemia secondary to blood loss (chronic): Secondary | ICD-10-CM

## 2020-10-30 DIAGNOSIS — K1233 Oral mucositis (ulcerative) due to radiation: Secondary | ICD-10-CM | POA: Diagnosis not present

## 2020-10-30 DIAGNOSIS — C158 Malignant neoplasm of overlapping sites of esophagus: Secondary | ICD-10-CM | POA: Diagnosis not present

## 2020-10-30 DIAGNOSIS — Z510 Encounter for antineoplastic radiation therapy: Secondary | ICD-10-CM | POA: Diagnosis not present

## 2020-10-30 DIAGNOSIS — K922 Gastrointestinal hemorrhage, unspecified: Secondary | ICD-10-CM

## 2020-10-30 DIAGNOSIS — R630 Anorexia: Secondary | ICD-10-CM

## 2020-10-30 LAB — CMP (CANCER CENTER ONLY)
ALT: 19 U/L (ref 0–44)
AST: 29 U/L (ref 15–41)
Albumin: 2.9 g/dL — ABNORMAL LOW (ref 3.5–5.0)
Alkaline Phosphatase: 132 U/L — ABNORMAL HIGH (ref 38–126)
Anion gap: 10 (ref 5–15)
BUN: 8 mg/dL (ref 8–23)
CO2: 26 mmol/L (ref 22–32)
Calcium: 8.7 mg/dL — ABNORMAL LOW (ref 8.9–10.3)
Chloride: 98 mmol/L (ref 98–111)
Creatinine: 0.63 mg/dL (ref 0.61–1.24)
GFR, Estimated: >60 mL/min (ref >60)
Glucose, Bld: 116 mg/dL — ABNORMAL HIGH (ref 70–99)
Potassium: 4.1 mmol/L (ref 3.5–5.1)
Sodium: 134 mmol/L — ABNORMAL LOW (ref 135–145)
Total Bilirubin: 0.4 mg/dL (ref 0.3–1.2)
Total Protein: 6.1 g/dL — ABNORMAL LOW (ref 6.5–8.1)

## 2020-10-30 LAB — CBC WITH DIFFERENTIAL (CANCER CENTER ONLY)
Abs Immature Granulocytes: 0.03 10*3/uL (ref 0.00–0.07)
Basophils Absolute: 0.1 10*3/uL (ref 0.0–0.1)
Basophils Relative: 2 %
Eosinophils Absolute: 0 10*3/uL (ref 0.0–0.5)
Eosinophils Relative: 1 %
HCT: 30.6 % — ABNORMAL LOW (ref 39.0–52.0)
Hemoglobin: 10.2 g/dL — ABNORMAL LOW (ref 13.0–17.0)
Immature Granulocytes: 1 %
Lymphocytes Relative: 21 %
Lymphs Abs: 0.8 10*3/uL (ref 0.7–4.0)
MCH: 26.8 pg (ref 26.0–34.0)
MCHC: 33.3 g/dL (ref 30.0–36.0)
MCV: 80.3 fL (ref 80.0–100.0)
Monocytes Absolute: 0.7 10*3/uL (ref 0.1–1.0)
Monocytes Relative: 18 %
Neutro Abs: 2.3 10*3/uL (ref 1.7–7.7)
Neutrophils Relative %: 57 %
Platelet Count: 235 10*3/uL (ref 150–400)
RBC: 3.81 MIL/uL — ABNORMAL LOW (ref 4.22–5.81)
RDW: 22.2 % — ABNORMAL HIGH (ref 11.5–15.5)
WBC Count: 3.9 10*3/uL — ABNORMAL LOW (ref 4.0–10.5)
nRBC: 0 % (ref 0.0–0.2)

## 2020-10-30 LAB — SAMPLE TO BLOOD BANK

## 2020-10-30 LAB — FERRITIN: Ferritin: 3601 ng/mL — ABNORMAL HIGH (ref 24–336)

## 2020-10-30 MED ORDER — HEPARIN SOD (PORK) LOCK FLUSH 100 UNIT/ML IV SOLN
500.0000 [IU] | Freq: Once | INTRAVENOUS | Status: AC | PRN
  Administered 2020-10-30: 500 [IU]
  Filled 2020-10-30: qty 5

## 2020-10-30 MED ORDER — ALTEPLASE 2 MG IJ SOLR
2.0000 mg | Freq: Once | INTRAMUSCULAR | Status: DC | PRN
  Filled 2020-10-30: qty 2

## 2020-10-30 MED ORDER — SODIUM CHLORIDE 0.9 % IV SOLN
200.0000 mg | Freq: Once | INTRAVENOUS | Status: DC
  Filled 2020-10-30: qty 10

## 2020-10-30 MED ORDER — FAMOTIDINE IN NACL 20-0.9 MG/50ML-% IV SOLN: 20.0000 mg | Freq: Once | INTRAVENOUS | Status: DC | PRN

## 2020-10-30 MED ORDER — SODIUM CHLORIDE 0.9 % IV SOLN
Freq: Once | INTRAVENOUS | Status: DC | PRN
  Filled 2020-10-30: qty 250

## 2020-10-30 MED ORDER — METHYLPREDNISOLONE SODIUM SUCC 125 MG IJ SOLR: 125.0000 mg | Freq: Once | INTRAMUSCULAR | Status: DC | PRN

## 2020-10-30 MED ORDER — SODIUM CHLORIDE 0.9 % IV SOLN
Freq: Once | INTRAVENOUS | Status: AC
  Filled 2020-10-30: qty 250

## 2020-10-30 MED ORDER — HEPARIN SOD (PORK) LOCK FLUSH 100 UNIT/ML IV SOLN
250.0000 [IU] | Freq: Once | INTRAVENOUS | Status: DC | PRN
  Filled 2020-10-30: qty 5

## 2020-10-30 MED ORDER — SODIUM CHLORIDE 0.9% FLUSH
10.0000 mL | Freq: Once | INTRAVENOUS | Status: AC | PRN
  Administered 2020-10-30: 10 mL
  Filled 2020-10-30: qty 10

## 2020-10-30 MED ORDER — ALBUTEROL SULFATE (2.5 MG/3ML) 0.083% IN NEBU
2.5000 mg | INHALATION_SOLUTION | Freq: Once | RESPIRATORY_TRACT | Status: DC | PRN
  Filled 2020-10-30: qty 3

## 2020-10-30 MED ORDER — DIPHENHYDRAMINE HCL 50 MG/ML IJ SOLN: 50.0000 mg | Freq: Once | INTRAMUSCULAR | Status: DC | PRN

## 2020-10-30 MED ORDER — SODIUM CHLORIDE 0.9% FLUSH
3.0000 mL | Freq: Once | INTRAVENOUS | Status: DC | PRN
Start: 2020-10-30 — End: 2020-10-30
  Filled 2020-10-30: qty 10

## 2020-10-30 MED ORDER — EPINEPHRINE 0.3 MG/0.3ML IJ SOAJ: 0.3000 mg | Freq: Once | INTRAMUSCULAR | Status: DC | PRN

## 2020-10-30 NOTE — Progress Notes (Signed)
Per Dr. Irene Limbo patient is not to receive venofer today.

## 2020-10-31 ENCOUNTER — Other Ambulatory Visit: Payer: Self-pay | Admitting: *Deleted

## 2020-10-31 ENCOUNTER — Telehealth: Payer: Self-pay | Admitting: Hematology

## 2020-10-31 ENCOUNTER — Other Ambulatory Visit: Payer: Self-pay | Admitting: Hematology

## 2020-10-31 DIAGNOSIS — C158 Malignant neoplasm of overlapping sites of esophagus: Secondary | ICD-10-CM

## 2020-10-31 MED ORDER — HYDROMORPHONE HCL 2 MG PO TABS: 2.0000 mg | ORAL_TABLET | Freq: Four times a day (QID) | ORAL | 0 refills | Status: DC | PRN

## 2020-10-31 NOTE — Telephone Encounter (Signed)
Ms. Reasoner called and requested refill of pain med for husband- hydromorphone hcl 2mg  Refill request routed to Dr. Irene Limbo

## 2020-10-31 NOTE — Telephone Encounter (Signed)
Scheduled per 02/21 los, patient has been called and voicemail was left. °

## 2020-11-02 ENCOUNTER — Other Ambulatory Visit: Payer: Self-pay

## 2020-11-02 ENCOUNTER — Inpatient Hospital Stay: Payer: No Typology Code available for payment source

## 2020-11-02 DIAGNOSIS — Z510 Encounter for antineoplastic radiation therapy: Secondary | ICD-10-CM | POA: Diagnosis not present

## 2020-11-02 DIAGNOSIS — C158 Malignant neoplasm of overlapping sites of esophagus: Secondary | ICD-10-CM

## 2020-11-02 MED ORDER — SODIUM CHLORIDE 0.9% FLUSH
10.0000 mL | INTRAVENOUS | Status: AC | PRN
  Administered 2020-11-02: 10 mL
  Filled 2020-11-02: qty 10

## 2020-11-02 MED ORDER — HEPARIN SOD (PORK) LOCK FLUSH 100 UNIT/ML IV SOLN
500.0000 [IU] | INTRAVENOUS | Status: AC | PRN
  Administered 2020-11-02: 500 [IU]
  Filled 2020-11-02: qty 5

## 2020-11-02 MED ORDER — SODIUM CHLORIDE 0.9 % IV SOLN
Freq: Once | INTRAVENOUS | Status: AC
  Filled 2020-11-02: qty 250

## 2020-11-02 NOTE — Patient Instructions (Signed)
Rehydration, Adult Rehydration is the replacement of body fluids, salts, and minerals (electrolytes) that are lost during dehydration. Dehydration is when there is not enough water or other fluids in the body. This happens when you lose more fluids than you take in. Common causes of dehydration include:  Not drinking enough fluids. This can occur when you are ill or doing activities that require a lot of energy, especially in hot weather.  Conditions that cause loss of water or other fluids, such as diarrhea, vomiting, sweating, or urinating a lot.  Other illnesses, such as fever or infection.  Certain medicines, such as those that remove excess fluid from the body (diuretics). Symptoms of mild or moderate dehydration may include thirst, dry lips and mouth, and dizziness. Symptoms of severe dehydration may include increased heart rate, confusion, fainting, and not urinating. For severe dehydration, you may need to get fluids through an IV at the hospital. For mild or moderate dehydration, you can usually rehydrate at home by drinking certain fluids as told by your health care provider. What are the risks? Generally, rehydration is safe. However, taking in too much fluid (overhydration) can be a problem. This is rare. Overhydration can cause an electrolyte imbalance, kidney failure, or a decrease in salt (sodium) levels in the body. Supplies needed You will need an oral rehydration solution (ORS) if your health care provider tells you to use one. This is a drink to treat dehydration. It can be found in pharmacies and retail stores. How to rehydrate Fluids Follow instructions from your health care provider for rehydration. The kind of fluid and the amount you should drink depend on your condition. In general, you should choose drinks that you prefer.  If told by your health care provider, drink an ORS. ? Make an ORS by following instructions on the package. ? Start by drinking small amounts,  about  cup (120 mL) every 5-10 minutes. ? Slowly increase how much you drink until you have taken the amount recommended by your health care provider.  Drink enough clear fluids to keep your urine pale yellow. If you were told to drink an ORS, finish it first, then start slowly drinking other clear fluids. Drink fluids such as: ? Water. This includes sparkling water and flavored water. Drinking only water can lead to having too little sodium in your body (hyponatremia). Follow the advice of your health care provider. ? Water from ice chips you suck on. ? Fruit juice with water you add to it (diluted). ? Sports drinks. ? Hot or cold herbal teas. ? Broth-based soups. ? Milk or milk products. Food Follow instructions from your health care provider about what to eat while you rehydrate. Your health care provider may recommend that you slowly begin eating regular foods in small amounts.  Eat foods that contain a healthy balance of electrolytes, such as bananas, oranges, potatoes, tomatoes, and spinach.  Avoid foods that are greasy or contain a lot of sugar. In some cases, you may get nutrition through a feeding tube that is passed through your nose and into your stomach (nasogastric tube, or NG tube). This may be done if you have uncontrolled vomiting or diarrhea.   Beverages to avoid Certain beverages may make dehydration worse. While you rehydrate, avoid drinking alcohol.   How to tell if you are recovering from dehydration You may be recovering from dehydration if:  You are urinating more often than before you started rehydrating.  Your urine is pale yellow.  Your energy level   improves.  You vomit less frequently.  You have diarrhea less frequently.  Your appetite improves or returns to normal.  You feel less dizzy or less light-headed.  Your skin tone and color start to look more normal. Follow these instructions at home:  Take over-the-counter and prescription medicines only  as told by your health care provider.  Do not take sodium tablets. Doing this can lead to having too much sodium in your body (hypernatremia). Contact a health care provider if:  You continue to have symptoms of mild or moderate dehydration, such as: ? Thirst. ? Dry lips. ? Slightly dry mouth. ? Dizziness. ? Dark urine or less urine than normal. ? Muscle cramps.  You continue to vomit or have diarrhea. Get help right away if you:  Have symptoms of dehydration that get worse.  Have a fever.  Have a severe headache.  Have been vomiting and the following happens: ? Your vomiting gets worse or does not go away. ? Your vomit includes blood or green matter (bile). ? You cannot eat or drink without vomiting.  Have problems with urination or bowel movements, such as: ? Diarrhea that gets worse or does not go away. ? Blood in your stool (feces). This may cause stool to look black and tarry. ? Not urinating, or urinating only a small amount of very dark urine, within 6-8 hours.  Have trouble breathing.  Have symptoms that get worse with treatment. These symptoms may represent a serious problem that is an emergency. Do not wait to see if the symptoms will go away. Get medical help right away. Call your local emergency services (911 in the [place] Do not drive yourself to the hospital. Summary  Rehydration is the replacement of body fluids and minerals (electrolytes) that are lost during dehydration.  Follow instructions from your health care provider for rehydration. The kind of fluid and amount you should drink depend on your condition.  Slowly increase how much you drink until you have taken the amount recommended by your health care provider.  Contact your health care provider if you continue to show signs of mild or moderate dehydration. This information is not intended to replace advice given to you by your health care provider. Make sure you discuss any questions you have with  your health care provider. Document Revised: 10/27/2019 Document Reviewed: 09/06/2019 Elsevier Patient Education  2021 Elsevier Inc.  

## 2020-11-20 NOTE — Progress Notes (Signed)
HEMATOLOGY/ONCOLOGY CLINIC NOTE  Date of Service: 11/21/2020  Patient Care Team: Berkley Harvey, NP as PCP - General (Nurse Practitioner) Brunetta Genera, MD as Consulting Physician (Hematology) Jonnie Finner, RN as Oncology Nurse Navigator  CHIEF COMPLAINTS/PURPOSE OF CONSULTATION:  GEJ Adenocarcinoma   HISTORY OF PRESENTING ILLNESS:  Donald Wade is a wonderful 65 y.o. 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 Pt is accompanied today by his wife, Donald Wade. The pt reports that he is doing well overall.  The pt reports that he feels better today than he has in a month. Pt was seen by Vascular Surgery for his Peripheral Artery Disease and was placed on Plavix and Cilostazol in September. Pt began having black, tarry stools about 1.5 months ago that he thought was medication-related. He became very lightheaded and dizzy prior to his his hospital admission on 08/21/2020. Pt was given 2 units of PRBC and IV Iron in the hospital. Pt was placed on Protonix twice per day and was taken off of Plavix and Cilostazol prior to discharge.   Pt denies any difficulty swallowing and is taking measures to eat small bites. He has had noticeable acid reflux in the last three months that has worsened recently.   He has Hypertension and Hyperlipidemia. He denies any history of COPD, Emphysema, Allergies, Diabetes, or ongoing heart issues. Pt is up to date with his Colonoscopies and has never had any Prostate issues.    Pt has had Upper Endoscopy completed on 08/22/2020 with results revealing "- Likely malignant esophageal tumor was found in the lower third of the esophagus and at the gastroesophageal junction. Biopsied. 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 - Normal gastric body, antrum and prepyloric region of the stomach. - Normal duodenal bulb, first portion of the duodenum, second portion of the duodenum and third  portion of the duodenum. - The examination was otherwise normal."  Of note prior to the patient's visit today, pt has had Esophageal/Stomach Mass Surgical Pathology [phone]) completed on 08/22/2020 with results revealing "A. ESOPHAGUS, DISTAL MASS, BIOPSY:  - Poorly differentiated adenocarcinoma with extracellular mucin and  signet ring cells. B. STOMACH, MASS, BIOPSY: - Poorly differentiated adenocarcinoma with extracellular mucin and  signet ring cells."   Pt has had CT C/A/P ([id number]) ([id number]) completed on 08/22/2020 with results revealing "1. Thickened appearance of the wall of the stomach adjacent to the gastroesophageal junction as well as proximal gastric wall concerning for infiltrative neoplasm. 2. Rounded gastrohepatic lymph nodes as well as mildly enlarged retroperitoneal lymph nodes concerning for early nodal metastatic disease. 3. No evidence of metastatic disease in the chest. 4. Colonic diverticulosis. 5. Aortic Atherosclerosis."  Most recent lab results (08/23/2020) of CBC is as follows: all values are WNL except for WBC at 3.9K, RBC at 2.93, Hgb at 7.6, HCT at 25.1, MCH at 25.9, RDW at 16.4, Glucose at 101, Calcium at 8.4, Total Protein at 5.8, Albumin at 3.2.  On review of systems, pt denies dysphagia, SOB, abdominal pain, dysuria, back pain, leg swelling and any other symptoms.   On PMHx the pt reports PAD, HTN, HLD, Appendectomy, Left Heart Cath. On Social Hx the pt reports that he is a non-smoker. He drinks 3-4 beers per day on average. Pt denies any other recreational drug use.  On Family Hx the pt reports that his mother had Colon Cancer in her mid-60's and his father had Throat Cancer after an extensive  history of cigar smoking.   INTERVAL HISTORY  Donald Wade is a wonderful 65 y.o. male. The patient's last visit with [place] was on 10/30/2020. He reports he is doing well overall.  The pt reports that he has been gradually improving since finishing radiation in 5  weeks. He notes that he was able to get first solid food down less than a week ago. He attempts to do two ensures daily. He is still using his feeding tube mostly. The pt has stopped all pain medications and finished his patch yesterday. He notes that it does not feel that the food is getting stuck, but that it sits there and he feels full. This is not with liquids, but just with solid foods he is trying to eat. The pt notes recent improvement.  Lab results today 11/21/2020 of CBC w/diff and CMP is as follows: all values are WNL except for RBC of 3.74, Hgb of 10.6, HCT of 31.7, RDW of 23.3, Sodium of 132, Glucose of 121, BUN of 7, Calcium of 8.8, Albumin of 3.1, Alkaline Phosphatase of 127. 11/21/2020 Magnesium of 1.6.  On review of systems, pt reports intermittent feeling of food getting stuck and denies bleeding issues, bloody/black stools, back pain, abdominal pain, back pain, leg swelling, and any other symptoms.  MEDICAL HISTORY:  Past Medical History:  Diagnosis Date  . Anemia 08/22/2020  . Heart murmur   . Hyperlipidemia   . Hypertension     SURGICAL HISTORY: Past Surgical History:  Procedure Laterality Date  . APPENDECTOMY  1980  . BIOPSY  08/22/2020   Procedure: BIOPSY;  Surgeon: Clarene Essex, MD;  Location: [facility] ENDOSCOPY;  Service: Endoscopy;;  . ESOPHAGOGASTRODUODENOSCOPY  08/22/2020  . ESOPHAGOGASTRODUODENOSCOPY N/A 08/22/2020   Procedure: ESOPHAGOGASTRODUODENOSCOPY (EGD);  Surgeon: Clarene Essex, MD;  Location: [place];  Service: Endoscopy;  Laterality: N/A;  . IR IMAGING GUIDED PORT INSERTION  09/07/2020  . LEFT HEART CATH AND CORONARY ANGIOGRAPHY N/A 11/23/2019   Procedure: LEFT HEART CATH AND CORONARY ANGIOGRAPHY;  Surgeon: Nigel Mormon, MD;  Location: [place] [facility];  Service: Cardiovascular;  Laterality: N/A;    SOCIAL HISTORY: Social History   Socioeconomic History  . Marital status: Married    Spouse name: Not on file  . Number of children: 2  .  Years of education: Not on file  . Highest education level: Not on file  Occupational History  . Not on file  Tobacco Use  . Smoking status: Never Smoker  . Smokeless tobacco: Never Used  Vaping Use  . Vaping Use: Never used  Substance and Sexual Activity  . Alcohol use: Yes    Comment: 2 beers daily  . Drug use: Never  . Sexual activity: Not on file  Other Topics Concern  . Not on file  Social History Narrative  . Not on file   Social Determinants of Health   Financial Resource Strain: Not on file  Food Insecurity: Not on file  Transportation Needs: Not on file  Physical Activity: Not on file  Stress: Not on file  Social Connections: Not on file  Intimate Partner Violence: Not on file    FAMILY HISTORY: Family History  Problem Relation Age of Onset  . Heart disease Mother   . Atrial fibrillation Mother     ALLERGIES:  is allergic to bee venom and penicillin g.  MEDICATIONS:  Current Outpatient Medications  Medication Sig Dispense Refill  . acetaminophen (TYLENOL) 325 MG tablet Take 650 mg by mouth every  6 (six) hours as needed for mild pain.    Marland Kitchen buPROPion (WELLBUTRIN XL) 150 MG 24 hr tablet Take 150 mg by mouth daily.     Marland Kitchen dexamethasone (DECADRON) 4 MG tablet Take 2 tablets (8 mg total) by mouth daily. Start the day after chemotherapy for 2 days. 30 tablet 1  . dronabinol (MARINOL) 2.5 MG capsule Take 1 capsule (2.5 mg total) by mouth 2 (two) times daily before a meal. 60 capsule 0  . EPINEPHrine 0.3 mg/0.3 mL IJ SOAJ injection Inject 0.3 mg into the skin as needed for anaphylaxis (call 911).    . feeding supplement (ENSURE ENLIVE / ENSURE PLUS) LIQD Take 237 mLs by mouth 2 (two) times daily between meals. 237 mL 12  . fentaNYL (DURAGESIC) 12 MCG/HR Place 2 patches onto the skin every 3 (three) days. 20 patch 0  . fexofenadine (ALLEGRA) 180 MG tablet Take 180 mg by mouth daily as needed for allergies.    Marland Kitchen HYDROmorphone (DILAUDID) 2 MG tablet Take 1 tablet (2 mg  total) by mouth every 6 (six) hours as needed for severe pain. 60 tablet 0  . lidocaine-prilocaine (EMLA) cream Apply to affected area once 30 g 3  . LORazepam (ATIVAN) 0.5 MG tablet Take 1 tablet (0.5 mg total) by mouth every 6 (six) hours as needed (Nausea or vomiting). 30 tablet 0  . magic mouthwash w/lidocaine SOLN 1 Part viscous lidocaine 2% 1 Part Maalox 1 Part diphenhydramine 12.5 mg per 5 ml elixir  Use 72ml swish and swallow four times a day as needed for throat or esophageal pain from radiation. 200 mL 1  . metoprolol succinate (TOPROL XL) 50 MG 24 hr tablet Take 1 tablet (50 mg total) by mouth daily. (Patient taking differently: Take 25 mg by mouth daily.) 90 tablet 2  . Multiple Vitamin (MULTI-VITAMIN) tablet Take 1 tablet by mouth daily.    . Omega-3 1000 MG CAPS Take 1,000 mg by mouth once a week.    . ondansetron (ZOFRAN) 8 MG tablet Take 1 tablet (8 mg total) by mouth 2 (two) times daily as needed for refractory nausea / vomiting. Start on day 3 after chemo. 30 tablet 1  . pantoprazole (PROTONIX) 40 MG tablet Take 1 tablet (40 mg total) by mouth 2 (two) times daily. 60 tablet 2  . polyethylene glycol (MIRALAX / GLYCOLAX) 17 g packet Take 17 g by mouth daily as needed for mild constipation. 14 each 0  . predniSONE (DELTASONE) 5 MG tablet 6 tab x 1 day, 5 tab x 1 day, 4 tab x 1 day, 3 tab x 1 day, 2 tab x 1 day, 1 tab x 1 day, stop 21 tablet 0  . prochlorperazine (COMPAZINE) 10 MG tablet Take 1 tablet (10 mg total) by mouth every 6 (six) hours as needed (Nausea or vomiting). 30 tablet 1  . sucralfate (CARAFATE) 1 GM/10ML suspension Take 10 mLs (1 g total) by mouth 2 (two) times daily. 420 mL 1  . triamcinolone lotion (KENALOG) 0.1 % Apply 1 application topically 3 (three) times daily. 120 mL 3  . vitamin B-12 (CYANOCOBALAMIN) 1000 MCG tablet Take 1,000 mcg by mouth daily.     No current facility-administered medications for this visit.    REVIEW OF SYSTEMS:   10 Point review of  Systems was done is negative except as noted above.  PHYSICAL EXAMINATION: ECOG PERFORMANCE STATUS: 1 - Symptomatic but completely ambulatory .BP 122/72   Pulse 73   Temp (!) 97  F (36.1 C) (Tympanic)   Resp 18   Ht 6' (1.829 m)   Wt 166 lb 14.4 oz (75.7 kg)   SpO2 99%   BMI 22.64 kg/m   GENERAL:alert, in no acute distress and comfortable SKIN: no acute rashes, no significant lesions EYES: conjunctiva are pink and non-injected, sclera anicteric OROPHARYNX: MMM, no exudates, no oropharyngeal erythema or ulceration NECK: supple, no JVD LYMPH:  no palpable lymphadenopathy in the cervical, axillary or inguinal regions LUNGS: clear to auscultation b/l with normal respiratory effort HEART: regular rate & rhythm ABDOMEN:  normoactive bowel sounds , non tender, not distended. Extremity: no pedal edema PSYCH: alert & oriented x 3 with fluent speech NEURO: no focal motor/sensory deficits   LABORATORY DATA:  I have reviewed the data as listed  CBC Latest Ref Rng & Units 11/21/2020 10/30/2020 10/19/2020  WBC 4.0 - 10.5 K/uL 5.4 3.9(L) 2.0(L)  Hemoglobin 13.0 - 17.0 g/dL 10.6(L) 10.2(L) 10.8(L)  Hematocrit 39.0 - 52.0 % 31.7(L) 30.6(L) 33.7(L)  Platelets 150 - 400 K/uL 237 235 196  ANC 1.5k  . CMP Latest Ref Rng & Units 11/21/2020 10/30/2020 10/19/2020  Glucose 70 - 99 mg/dL 121(H) 116(H) 115(H)  BUN 8 - 23 mg/dL 7(L) 8 7(L)  Creatinine 0.61 - 1.24 mg/dL 0.68 0.63 0.67  Sodium 135 - 145 mmol/L 132(L) 134(L) 134(L)  Potassium 3.5 - 5.1 mmol/L 4.0 4.1 3.5  Chloride 98 - 111 mmol/L 100 98 96(L)  CO2 22 - 32 mmol/L 26 26 29   Calcium 8.9 - 10.3 mg/dL 8.8(L) 8.7(L) 8.6(L)  Total Protein 6.5 - 8.1 g/dL 6.6 6.1(L) 6.2(L)  Total Bilirubin 0.3 - 1.2 mg/dL 0.6 0.4 0.5  Alkaline Phos 38 - 126 U/L 127(H) 132(H) 215(H)  AST 15 - 41 U/L 23 29 21   ALT 0 - 44 U/L 22 19 19     08/22/2020 Esophageal/Stomach Mass Surgical Pathology [id number]):   RADIOGRAPHIC STUDIES: I have personally  reviewed the radiological images as listed and agreed with the findings in the report. No results found.  ASSESSMENT & PLAN:   65 yo male with no significant smoking or tobacco use h/o but with chronic reflux and etoh use with   1) Newly diagnosed GE junction poorly differentiated with extracellular mucin and signet ring cells. [id number] No overt obstructive symptoms at this time Esophageal/Stomach Mass Surgical Pathology [phone]) which revealed "A. ESOPHAGUS, DISTAL MASS, BIOPSY:  - Poorly differentiated adenocarcinoma with extracellular mucin and  signet ring cells. B. STOMACH, MASS, BIOPSY: - Poorly differentiated adenocarcinoma with extracellular mucin and signet ring cells."  PET/CT 08/31/2020- Wall thickening/mass centered in the gastric cardia, compatible with the patient's known primary neoplasm. This predominantly involves the proximal stomach rather than the distal esophagus. Small gastrohepatic and para-aortic nodes, suspicious for nodal metastases  2) s/p GI bleeding- melena with symptomatic anemia s/p PRBC transfusion.  No further evidence of GI bleeding at this time. 3) Iron deficiency anemia from GI bleeding - additional IV iron ordered 4) Abnormal LFTs -- resolved 5) Radiation mucositis   PLAN: -Discussed pt labwork today, 11/21/2020; WBC normalized, Plt stable, liver function improved, sodium slightly lower. Magnesium levels lower. -Advised pt that the current importance is on recovering from radiation and being able to drink and eat more without the tube. -Recommended dry fruits for increasing magnesium. -Recommend pt continue to eat well, drink 48-64 oz water daily, and walk 20-30 min daily. Recommend pt get proper sleep. -Advised pt that dryer foods and harder meats harder to swallow. Advised pt  he can drink 3-4 Ensure or Boost daily if tolerable. Advised pt to modify it with ice cream or high calorie foods. -Emphasized creativity in eating and modifying textures of  food. Importance of eating and hydrating well these next 4 weeks. -Continue Metoprolol. -Continue salt and baking soda mouth washes prn. -Will get PET scan in 4 weeks. -Will see back in 5 weeks.    FOLLOW UP: PET/CT in 4 weeks  RTC with Dr Irene Limbo with portflush and labs in 5 weeks   All of the patients questions were answered with apparent satisfaction. The patient knows to call the clinic with any problems, questions or concerns.    The total time spent in the appointment was 20 minutes and more than 50% was on counseling and direct patient cares.   Sullivan Lone MD [place] AAHIVMS [facility] Hematology/Oncology Physician [facility]  (Office):       [phone] (Work cell):  [phone] (Fax):           [phone]  I, Reinaldo Raddle, am acting as scribe for Dr. Sullivan Lone, MD.

## 2020-11-21 ENCOUNTER — Inpatient Hospital Stay: Payer: No Typology Code available for payment source

## 2020-11-21 ENCOUNTER — Other Ambulatory Visit: Payer: Self-pay

## 2020-11-21 ENCOUNTER — Inpatient Hospital Stay: Payer: No Typology Code available for payment source | Attending: Hematology

## 2020-11-21 ENCOUNTER — Other Ambulatory Visit: Payer: Self-pay | Admitting: *Deleted

## 2020-11-21 ENCOUNTER — Inpatient Hospital Stay: Payer: No Typology Code available for payment source | Admitting: Hematology

## 2020-11-21 VITALS — BP 122/72 | HR 73 | Temp 97.0°F | Resp 18 | Ht 72.0 in | Wt 166.9 lb

## 2020-11-21 DIAGNOSIS — K1233 Oral mucositis (ulcerative) due to radiation: Secondary | ICD-10-CM

## 2020-11-21 DIAGNOSIS — D500 Iron deficiency anemia secondary to blood loss (chronic): Secondary | ICD-10-CM | POA: Diagnosis not present

## 2020-11-21 DIAGNOSIS — I739 Peripheral vascular disease, unspecified: Secondary | ICD-10-CM | POA: Diagnosis not present

## 2020-11-21 DIAGNOSIS — C158 Malignant neoplasm of overlapping sites of esophagus: Secondary | ICD-10-CM

## 2020-11-21 DIAGNOSIS — Z79899 Other long term (current) drug therapy: Secondary | ICD-10-CM | POA: Insufficient documentation

## 2020-11-21 DIAGNOSIS — Z923 Personal history of irradiation: Secondary | ICD-10-CM | POA: Insufficient documentation

## 2020-11-21 DIAGNOSIS — R011 Cardiac murmur, unspecified: Secondary | ICD-10-CM | POA: Diagnosis not present

## 2020-11-21 DIAGNOSIS — K219 Gastro-esophageal reflux disease without esophagitis: Secondary | ICD-10-CM | POA: Insufficient documentation

## 2020-11-21 DIAGNOSIS — E785 Hyperlipidemia, unspecified: Secondary | ICD-10-CM | POA: Insufficient documentation

## 2020-11-21 DIAGNOSIS — I10 Essential (primary) hypertension: Secondary | ICD-10-CM | POA: Diagnosis not present

## 2020-11-21 DIAGNOSIS — F17210 Nicotine dependence, cigarettes, uncomplicated: Secondary | ICD-10-CM | POA: Diagnosis not present

## 2020-11-21 DIAGNOSIS — C160 Malignant neoplasm of cardia: Secondary | ICD-10-CM | POA: Insufficient documentation

## 2020-11-21 DIAGNOSIS — Z95828 Presence of other vascular implants and grafts: Secondary | ICD-10-CM | POA: Insufficient documentation

## 2020-11-21 LAB — CMP (CANCER CENTER ONLY)
ALT: 22 U/L (ref 0–44)
AST: 23 U/L (ref 15–41)
Albumin: 3.1 g/dL — ABNORMAL LOW (ref 3.5–5.0)
Alkaline Phosphatase: 127 U/L — ABNORMAL HIGH (ref 38–126)
Anion gap: 6 (ref 5–15)
BUN: 7 mg/dL — ABNORMAL LOW (ref 8–23)
CO2: 26 mmol/L (ref 22–32)
Calcium: 8.8 mg/dL — ABNORMAL LOW (ref 8.9–10.3)
Chloride: 100 mmol/L (ref 98–111)
Creatinine: 0.68 mg/dL (ref 0.61–1.24)
GFR, Estimated: >60 mL/min (ref >60)
Glucose, Bld: 121 mg/dL — ABNORMAL HIGH (ref 70–99)
Potassium: 4 mmol/L (ref 3.5–5.1)
Sodium: 132 mmol/L — ABNORMAL LOW (ref 135–145)
Total Bilirubin: 0.6 mg/dL (ref 0.3–1.2)
Total Protein: 6.6 g/dL (ref 6.5–8.1)

## 2020-11-21 LAB — CBC WITH DIFFERENTIAL (CANCER CENTER ONLY)
Abs Immature Granulocytes: 0.02 10*3/uL (ref 0.00–0.07)
Basophils Absolute: 0.1 10*3/uL (ref 0.0–0.1)
Basophils Relative: 1 %
Eosinophils Absolute: 0 10*3/uL (ref 0.0–0.5)
Eosinophils Relative: 1 %
HCT: 31.7 % — ABNORMAL LOW (ref 39.0–52.0)
Hemoglobin: 10.6 g/dL — ABNORMAL LOW (ref 13.0–17.0)
Immature Granulocytes: 0 %
Lymphocytes Relative: 21 %
Lymphs Abs: 1.2 10*3/uL (ref 0.7–4.0)
MCH: 28.3 pg (ref 26.0–34.0)
MCHC: 33.4 g/dL (ref 30.0–36.0)
MCV: 84.8 fL (ref 80.0–100.0)
Monocytes Absolute: 0.6 10*3/uL (ref 0.1–1.0)
Monocytes Relative: 11 %
Neutro Abs: 3.6 10*3/uL (ref 1.7–7.7)
Neutrophils Relative %: 66 %
Platelet Count: 237 10*3/uL (ref 150–400)
RBC: 3.74 MIL/uL — ABNORMAL LOW (ref 4.22–5.81)
RDW: 23.3 % — ABNORMAL HIGH (ref 11.5–15.5)
WBC Count: 5.4 10*3/uL (ref 4.0–10.5)
nRBC: 0 % (ref 0.0–0.2)

## 2020-11-21 LAB — SAMPLE TO BLOOD BANK

## 2020-11-21 LAB — MAGNESIUM: Magnesium: 1.6 mg/dL — ABNORMAL LOW (ref 1.7–2.4)

## 2020-11-21 MED ORDER — SODIUM CHLORIDE 0.9% FLUSH
10.0000 mL | Freq: Once | INTRAVENOUS | Status: AC
  Administered 2020-11-21: 10 mL
  Filled 2020-11-21: qty 10

## 2020-11-21 MED ORDER — HEPARIN SOD (PORK) LOCK FLUSH 100 UNIT/ML IV SOLN
500.0000 [IU] | Freq: Once | INTRAVENOUS | Status: AC
  Administered 2020-11-21: 500 [IU]
  Filled 2020-11-21: qty 5

## 2020-11-22 ENCOUNTER — Telehealth: Payer: Self-pay | Admitting: Hematology

## 2020-11-22 NOTE — Telephone Encounter (Signed)
Scheduled follow-up appointment per 3/15 los. Patient is aware. 

## 2020-11-24 ENCOUNTER — Telehealth: Payer: Self-pay

## 2020-11-24 NOTE — Telephone Encounter (Signed)
Received call from pt wanting to know about his PET scan appt. Informed him that Dr. Irene Limbo is aware of this and working on putting in the orders ASAP. Pt will call back next week to follow up.

## 2020-12-05 ENCOUNTER — Telehealth: Payer: Self-pay

## 2020-12-05 NOTE — Telephone Encounter (Signed)
Nutrition Follow-up:  Patient has completed treatment for gastroesophageal cancer.    Spoke with patient via phone.  Patient reports that he is trying to eat more calories and has been recording intake and eating < or equal to 2000 calories per day.  Reports drinking 1 ensure/boost shake daily.  Typically eats 3 meals per day and snacks.  Gets full quickly. "I think my stomach has shrunk."      Medications: reviewed  Labs: reviewed  Anthropometrics:   Weight 166 lb 14.4 oz on 3/15  168 lb on 2/14 175 lb on 2/10   NUTRITION DIAGNOSIS: Inadequate oral intake stable   INTERVENTION:  Encouraged patient to add condiments, sauces, gravies, dips, dressings to food for additional calories.   Encouraged activity as tolerated     MONITORING, EVALUATION, GOAL: intake, weight trends   NEXT VISIT: phone call in ~4-6 weeks  Donald Wade B. Zenia Resides, [place], [place] Registered Dietitian [phone] (mobile)

## 2020-12-25 ENCOUNTER — Other Ambulatory Visit: Payer: Self-pay

## 2020-12-25 ENCOUNTER — Ambulatory Visit (HOSPITAL_COMMUNITY)
Admission: RE | Admit: 2020-12-25 | Discharge: 2020-12-25 | Disposition: A | Discharge status: Home / Self Care | Payer: No Typology Code available for payment source | Source: Ambulatory Visit | Attending: Hematology | Admitting: Hematology

## 2020-12-25 DIAGNOSIS — C158 Malignant neoplasm of overlapping sites of esophagus: Secondary | ICD-10-CM | POA: Diagnosis present

## 2020-12-25 LAB — GLUCOSE, CAPILLARY: Glucose-Capillary: 98 mg/dL (ref 70–99)

## 2020-12-25 MED ORDER — FLUDEOXYGLUCOSE F - 18 (FDG) INJECTION
8.4000 | Freq: Once | INTRAVENOUS | Status: AC | PRN
  Administered 2020-12-25: 8.2 via INTRAVENOUS

## 2020-12-31 NOTE — Progress Notes (Signed)
HEMATOLOGY/ONCOLOGY CLINIC NOTE  Date of Service: 01/01/2021  Patient Care Team: Berkley Harvey, NP as PCP - General (Nurse Practitioner) Brunetta Genera, MD as Consulting Physician (Hematology) Jonnie Finner, RN as Oncology Nurse Navigator  CHIEF COMPLAINTS/PURPOSE OF CONSULTATION:  GEJ Adenocarcinoma   HISTORY OF PRESENTING ILLNESS:  Donald Wade is a wonderful 65 y.o. 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 Pt is accompanied today by his wife, Donald Wade. The pt reports that he is doing well overall.  The pt reports that he feels better today than he has in a month. Pt was seen by Vascular Surgery for his Peripheral Artery Disease and was placed on Plavix and Cilostazol in September. Pt began having black, tarry stools about 1.5 months ago that he thought was medication-related. He became very lightheaded and dizzy prior to his his hospital admission on 08/21/2020. Pt was given 2 units of PRBC and IV Iron in the hospital. Pt was placed on Protonix twice per day and was taken off of Plavix and Cilostazol prior to discharge.   Pt denies any difficulty swallowing and is taking measures to eat small bites. He has had noticeable acid reflux in the last three months that has worsened recently.   He has Hypertension and Hyperlipidemia. He denies any history of COPD, Emphysema, Allergies, Diabetes, or ongoing heart issues. Pt is up to date with his Colonoscopies and has never had any Prostate issues.    Pt has had Upper Endoscopy completed on 08/22/2020 with results revealing "- Likely malignant esophageal tumor was found in the lower third of the esophagus and at the gastroesophageal junction. Biopsied. 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 - Normal gastric body, antrum and prepyloric region of the stomach. - Normal duodenal bulb, first portion of the duodenum, second portion of the duodenum and third  portion of the duodenum. - The examination was otherwise normal."  Of note prior to the patient's visit today, pt has had Esophageal/Stomach Mass Surgical Pathology [phone]) completed on 08/22/2020 with results revealing "A. ESOPHAGUS, DISTAL MASS, BIOPSY:  - Poorly differentiated adenocarcinoma with extracellular mucin and  signet ring cells. B. STOMACH, MASS, BIOPSY: - Poorly differentiated adenocarcinoma with extracellular mucin and  signet ring cells."   Pt has had CT C/A/P ([id number]) ([id number]) completed on 08/22/2020 with results revealing "1. Thickened appearance of the wall of the stomach adjacent to the gastroesophageal junction as well as proximal gastric wall concerning for infiltrative neoplasm. 2. Rounded gastrohepatic lymph nodes as well as mildly enlarged retroperitoneal lymph nodes concerning for early nodal metastatic disease. 3. No evidence of metastatic disease in the chest. 4. Colonic diverticulosis. 5. Aortic Atherosclerosis."  Most recent lab results (08/23/2020) of CBC is as follows: all values are WNL except for WBC at 3.9K, RBC at 2.93, Hgb at 7.6, HCT at 25.1, MCH at 25.9, RDW at 16.4, Glucose at 101, Calcium at 8.4, Total Protein at 5.8, Albumin at 3.2.  On review of systems, pt denies dysphagia, SOB, abdominal pain, dysuria, back pain, leg swelling and any other symptoms.   On PMHx the pt reports PAD, HTN, HLD, Appendectomy, Left Heart Cath. On Social Hx the pt reports that he is a non-smoker. He drinks 3-4 beers per day on average. Pt denies any other recreational drug use.  On Family Hx the pt reports that his mother had Colon Cancer in her mid-60's and his father had Throat Cancer after an extensive  history of cigar smoking.   INTERVAL HISTORY  Donald Wade is a wonderful 65 y.o. male. The patient's last visit with [place] was on 11/21/2020. He reports he is doing well overall.  The pt reports no new symptoms or concerns. The pt notes the burning sensation has  been improving slightly. The pt notes he gets full very quickly. The pt notes his weight has been steady with no weight loss. He notes that his stomach aches and is very bloated after eating or drinking and needs to lay down. The pt notes that he is currently fatigued and not able to do as much activity as he prefers to do.  Of note since the patient's last visit, pt has had PET Skull Base to Thigh ([id number]) on 12/25/2020, which revealed "1. Apparent interval mixed response to therapy. 2. Hypermetabolism associated with the primary gastric lesion is stable to minimally decreased and previously identified gastrohepatic ligament and retroperitoneal lymph nodes measure minimally smaller today. 3. Interval increase in size of left thoracic inlet lymph node with mild hypermetabolism. New metastatic lesion a concern. 4. Interval development of scattered bone lesions showing low level hypermetabolism with subtle sclerosis on CT imaging. Appearance is worrisome for bony metastatic involvement. 5. Left colonic diverticulosis. 6. Aortic Atherosclerois (ICD10-170.0)"  Lab results today 01/01/2021 of CBC w/diff and CMP is as follows: all values are WNL except for WBC of 3.8K, RBC of 3.21, Hgb of 10.5, HCT of 30.0, Sodium of 133, Glucose of 105, Calcium of 8.7, Albumin of 3.3, Alkaline Phosphatase of 138. 01/01/2021 Magnesium of 1.8. 01/01/2021 Prealbumin pending.  On review of systems, pt reports continued mouth burning sensation, fatigue and denies bloody/black stools, new bone pains, spinal pain, new lumps/bumps, back pain, and any other symptoms.  MEDICAL HISTORY:  Past Medical History:  Diagnosis Date  . Anemia 08/22/2020  . Heart murmur   . Hyperlipidemia   . Hypertension     SURGICAL HISTORY: Past Surgical History:  Procedure Laterality Date  . APPENDECTOMY  1980  . BIOPSY  08/22/2020   Procedure: BIOPSY;  Surgeon: Clarene Essex, MD;  Location: [facility] ENDOSCOPY;  Service: Endoscopy;;  .  ESOPHAGOGASTRODUODENOSCOPY  08/22/2020  . ESOPHAGOGASTRODUODENOSCOPY N/A 08/22/2020   Procedure: ESOPHAGOGASTRODUODENOSCOPY (EGD);  Surgeon: Clarene Essex, MD;  Location: [place];  Service: Endoscopy;  Laterality: N/A;  . IR IMAGING GUIDED PORT INSERTION  09/07/2020  . LEFT HEART CATH AND CORONARY ANGIOGRAPHY N/A 11/23/2019   Procedure: LEFT HEART CATH AND CORONARY ANGIOGRAPHY;  Surgeon: Nigel Mormon, MD;  Location: [place] [facility] LAB;  Service: Cardiovascular;  Laterality: N/A;    SOCIAL HISTORY: Social History   Socioeconomic History  . Marital status: Married    Spouse name: Not on file  . Number of children: 2  . Years of education: Not on file  . Highest education level: Not on file  Occupational History  . Not on file  Tobacco Use  . Smoking status: Never Smoker  . Smokeless tobacco: Never Used  Vaping Use  . Vaping Use: Never used  Substance and Sexual Activity  . Alcohol use: Yes    Comment: 2 beers daily  . Drug use: Never  . Sexual activity: Not on file  Other Topics Concern  . Not on file  Social History Narrative  . Not on file   Social Determinants of Health   Financial Resource Strain: Not on file  Food Insecurity: Not on file  Transportation Needs: Not on file  Physical Activity: Not on file  Stress: Not on file  Social Connections: Not on file  Intimate Partner Violence: Not on file    FAMILY HISTORY: Family History  Problem Relation Age of Onset  . Heart disease Mother   . Atrial fibrillation Mother     ALLERGIES:  is allergic to bee venom and penicillin g.  MEDICATIONS:  Current Outpatient Medications  Medication Sig Dispense Refill  . acetaminophen (TYLENOL) 325 MG tablet Take 650 mg by mouth every 6 (six) hours as needed for mild pain.    Marland Kitchen buPROPion (WELLBUTRIN XL) 150 MG 24 hr tablet Take 150 mg by mouth daily.     Marland Kitchen EPINEPHrine 0.3 mg/0.3 mL IJ SOAJ injection Inject 0.3 mg into the skin as needed for anaphylaxis (call 911).     . feeding supplement (ENSURE ENLIVE / ENSURE PLUS) LIQD Take 237 mLs by mouth 2 (two) times daily between meals. 237 mL 12  . fexofenadine (ALLEGRA) 180 MG tablet Take 180 mg by mouth daily as needed for allergies.    Marland Kitchen lidocaine-prilocaine (EMLA) cream Apply to affected area once 30 g 3  . metoprolol succinate (TOPROL XL) 50 MG 24 hr tablet Take 1 tablet (50 mg total) by mouth daily. (Patient taking differently: Take 25 mg by mouth daily.) 90 tablet 2  . Multiple Vitamin (MULTI-VITAMIN) tablet Take 1 tablet by mouth daily.    . ondansetron (ZOFRAN) 8 MG tablet Take 1 tablet (8 mg total) by mouth 2 (two) times daily as needed for refractory nausea / vomiting. Start on day 3 after chemo. 30 tablet 1  . pantoprazole (PROTONIX) 40 MG tablet Take 1 tablet (40 mg total) by mouth 2 (two) times daily. 60 tablet 2  . polyethylene glycol (MIRALAX / GLYCOLAX) 17 g packet Take 17 g by mouth daily as needed for mild constipation. 14 each 0  . triamcinolone lotion (KENALOG) 0.1 % Apply 1 application topically 3 (three) times daily. 120 mL 3  . vitamin B-12 (CYANOCOBALAMIN) 1000 MCG tablet Take 1,000 mcg by mouth daily.    Marland Kitchen dexamethasone (DECADRON) 4 MG tablet Take 2 tablets (8 mg total) by mouth daily. Start the day after chemotherapy for 2 days. (Patient not taking: Reported on 01/01/2021) 30 tablet 1  . dronabinol (MARINOL) 2.5 MG capsule Take 1 capsule (2.5 mg total) by mouth 2 (two) times daily before a meal. (Patient not taking: Reported on 01/01/2021) 60 capsule 0  . fentaNYL (DURAGESIC) 12 MCG/HR Place 2 patches onto the skin every 3 (three) days. (Patient not taking: Reported on 01/01/2021) 20 patch 0  . HYDROmorphone (DILAUDID) 2 MG tablet Take 1 tablet (2 mg total) by mouth every 6 (six) hours as needed for severe pain. (Patient not taking: Reported on 01/01/2021) 60 tablet 0  . LORazepam (ATIVAN) 0.5 MG tablet Take 1 tablet (0.5 mg total) by mouth every 6 (six) hours as needed (Nausea or vomiting).  (Patient not taking: Reported on 01/01/2021) 30 tablet 0  . magic mouthwash w/lidocaine SOLN 1 Part viscous lidocaine 2% 1 Part Maalox 1 Part diphenhydramine 12.5 mg per 5 ml elixir  Use 37m swish and swallow four times a day as needed for throat or esophageal pain from radiation. (Patient not taking: Reported on 01/01/2021) 200 mL 1  . Omega-3 1000 MG CAPS Take 1,000 mg by mouth once a week. (Patient not taking: Reported on 01/01/2021)    . predniSONE (DELTASONE) 5 MG tablet 6 tab x 1 day, 5 tab x 1 day, 4 tab x 1 day,  3 tab x 1 day, 2 tab x 1 day, 1 tab x 1 day, stop (Patient not taking: Reported on 01/01/2021) 21 tablet 0  . prochlorperazine (COMPAZINE) 10 MG tablet Take 1 tablet (10 mg total) by mouth every 6 (six) hours as needed (Nausea or vomiting). (Patient not taking: Reported on 01/01/2021) 30 tablet 1  . sucralfate (CARAFATE) 1 GM/10ML suspension Take 10 mLs (1 g total) by mouth 2 (two) times daily. (Patient not taking: Reported on 01/01/2021) 420 mL 1   No current facility-administered medications for this visit.    REVIEW OF SYSTEMS:   10 Point review of Systems was done is negative except as noted above.  PHYSICAL EXAMINATION: ECOG PERFORMANCE STATUS: 1 - Symptomatic but completely ambulatory .BP 125/62 (BP Location: Left Arm, Patient Position: Sitting)   Pulse 70   Temp 97.9 F (36.6 C) (Tympanic)   Resp 17   Ht 6' (1.829 m)   Wt 162 lb 8 oz (73.7 kg)   SpO2 100%   BMI 22.04 kg/m   GENERAL:alert, in no acute distress and comfortable SKIN: no acute rashes, no significant lesions EYES: conjunctiva are pink and non-injected, sclera anicteric OROPHARYNX: MMM, no exudates, no oropharyngeal erythema or ulceration NECK: supple, no JVD LYMPH:  no palpable lymphadenopathy in the cervical, axillary or inguinal regions LUNGS: clear to auscultation b/l with normal respiratory effort HEART: regular rate & rhythm ABDOMEN:  normoactive bowel sounds , non tender, not  distended. Extremity: no pedal edema PSYCH: alert & oriented x 3 with fluent speech NEURO: no focal motor/sensory deficits   LABORATORY DATA:  I have reviewed the data as listed  CBC Latest Ref Rng & Units 01/01/2021 11/21/2020 10/30/2020  WBC 4.0 - 10.5 K/uL 3.8(L) 5.4 3.9(L)  Hemoglobin 13.0 - 17.0 g/dL 10.5(L) 10.6(L) 10.2(L)  Hematocrit 39.0 - 52.0 % 30.0(L) 31.7(L) 30.6(L)  Platelets 150 - 400 K/uL 209 237 235  ANC 1.5k  . CMP Latest Ref Rng & Units 01/01/2021 11/21/2020 10/30/2020  Glucose 70 - 99 mg/dL 105(H) 121(H) 116(H)  BUN 8 - 23 mg/dL 12 7(L) 8  Creatinine 0.61 - 1.24 mg/dL 0.85 0.68 0.63  Sodium 135 - 145 mmol/L 133(L) 132(L) 134(L)  Potassium 3.5 - 5.1 mmol/L 4.1 4.0 4.1  Chloride 98 - 111 mmol/L 99 100 98  CO2 22 - 32 mmol/L _0 Calcium 8.9 - 10.3 mg/dL 8.7(L) 8.8(L) 8.7(L)  Total Protein 6.5 - 8.1 g/dL 6.7 6.6 6.1(L)  Total Bilirubin 0.3 - 1.2 mg/dL 0.5 0.6 0.4  Alkaline Phos 38 - 126 U/L 138(H) 127(H) 132(H)  AST 15 - 41 U/L _1 ALT 0 - 44 U/L _2 08/22/2020 Esophageal/Stomach Mass Surgical Pathology [phone]):   RADIOGRAPHIC STUDIES: I have personally reviewed the radiological images as listed and agreed with the findings in the report. NM PET Image Restag (PS) Skull Base To Thigh  Result Date: 12/25/2020 CLINICAL DATA:  Subsequent treatment strategy for adenocarcinoma of the GE junction. EXAM: NUCLEAR MEDICINE PET SKULL BASE TO THIGH TECHNIQUE: 8.2 mCi F-18 FDG was injected intravenously. Full-ring PET imaging was performed from the skull base to thigh after the radiotracer. CT data was obtained and used for attenuation correction and anatomic localization. Fasting blood glucose: 98 mg/dl COMPARISON:  08/31/2020 FINDINGS: Mediastinal blood pool activity: SUV max 2.1 Liver activity: SUV max NA NECK: No hypermetabolic lymph nodes in the neck. Incidental CT findings: none CHEST: No hypermetabolic mediastinal or hilar nodes. 10 mm  short axis  left thoracic inlet node on image 57/4 shows SUV max = 3.6 and has increased in size from 6 mm short axis previously. No suspicious pulmonary nodules on the CT scan. Incidental CT findings: Coronary artery calcification is evident. Atherosclerotic calcification is noted in the wall of the thoracic aorta. Right Port-A-Cath tip is positioned in the distal SVC. No suspicious pulmonary nodule or mass. ABDOMEN/PELVIS: Hypermetabolism again noted in the region of the GE junction/gastric cardia with SUV max = 6.0 (8.5 previously). Hypermetabolic uptake tracks along the proximal aspect of the medial gastric wall with similar SUV max = 6.3. The prominent gastrohepatic ligament lymph node seen previously are similar to slightly decreased. 7 mm short axis node on 122/4 today was the 9 mm node measured previously. No hypermetabolism in these lymph nodes on imaging today. The scattered retroperitoneal lymph nodes seen previously are also slightly decreased in the interval. 10 mm left para-aortic index node measured previously is 8 mm short axis today on 142/4. 6 mm short axis pre caval node on 157/4 was 9 mm short axis previously (remeasured) no new hypermetabolic lymphadenopathy in the abdomen or pelvis. Incidental CT findings: There is abdominal aortic atherosclerosis without aneurysm. Diverticular changes noted in the left colon without diverticulitis. SKELETON: Multiple scattered tiny hypermetabolic bone lesions are identified, involving ribs, scapula bilaterally, thoracolumbar spine, and bony pelvis. Many of the subtle foci of hypermetabolism have associated subtle sclerotic lesions on CT imaging. Sclerotic lesion in the posterior left iliac bone on image 166/4 demonstrates SUV max = 2.7. Subtle left iliac lesion measuring 4 mm on 180/4 has SUV max = 2.6. Subtle tiny sclerotic lesion in the left sacrum visible on 182/4 demonstrates SUV max = 2.4. Incidental CT findings: none IMPRESSION: 1. Apparent interval mixed response  to therapy. 2. Hypermetabolism associated with the primary gastric lesion is stable to minimally decreased and previously identified gastrohepatic ligament and retroperitoneal lymph nodes measure minimally smaller today. 3. Interval increase in size of left thoracic inlet lymph node with mild hypermetabolism. New metastatic lesion a concern. 4. Interval development of scattered bone lesions showing low level hypermetabolism with subtle sclerosis on CT imaging. Appearance is worrisome for bony metastatic involvement. 5. Left colonic diverticulosis. 6.  Aortic Atherosclerois (ICD10-170.0) Electronically Signed   By: Misty Stanley M.D.   On: 12/25/2020 11:33    ASSESSMENT & PLAN:   65 yo male with no significant smoking or tobacco use h/o but with chronic reflux and etoh use with   1) Recently diagnosed GE junction poorly differentiated with extracellular mucin and signet ring cells. [id number] No overt obstructive symptoms at this time Esophageal/Stomach Mass Surgical Pathology [phone]) which revealed "A. ESOPHAGUS, DISTAL MASS, BIOPSY:  - Poorly differentiated adenocarcinoma with extracellular mucin and  signet ring cells. B. STOMACH, MASS, BIOPSY: - Poorly differentiated adenocarcinoma with extracellular mucin and signet ring cells."  PET/CT 08/31/2020- Wall thickening/mass centered in the gastric cardia, compatible with the patient's known primary neoplasm. This predominantly involves the proximal stomach rather than the distal esophagus. Small gastrohepatic and para-aortic nodes, suspicious for nodal metastases  2) s/p GI bleeding- melena with symptomatic anemia s/p PRBC transfusion.  No further evidence of GI bleeding at this time. 3) Iron deficiency anemia from GI bleeding - additional IV iron ordered 4) Abnormal LFTs -- resolved 5) s/p Radiation mucositis   PLAN: -Discussed pt labwork today, 01/01/2021; blood counts stable, albumin improved, chemistries stable. Magnesium  normal. -Discussed pt PET Skull Base to Thigh ([id number]) on 12/25/2020; area  that was radiated has shrunk in activity and decreased in brightness. 1 cm lymph node in upper thorax borderline increased in activity. Main concern for small spots in bones found that were not there before. Some changes on CT component that are concerning for possibility of tumor there in the bone as well. -Advised pt that the newfound spots in the bones are not large enough to biopsy. -Recommend pt continue to eat well, drink 48-64 oz water daily, and walk 20-30 min daily. Recommend pt get proper sleep. -Advised pt that dryer foods and harder meats harder to swallow. Advised pt he can drink 3-4 Ensure or Boost daily if tolerable. Advised pt to modify it with ice cream or high calorie foods. -Discussed concern for spread of tumor and aggressive nature despite chemotherapy and radiation of the primary tumor. -Will confirm with Radiologist that this aggression and spread of tumor outside of primary area is what is occurring. -Advised pt that if this is metastatic disease, we will try to focus on palliative treatments as this is not curable. -Will add genetic markers to observe for Her2 or Pdl1 expression to see for treatment options that will be most beneficial. -Advised pt that the only way to observe for progression in primary spots and residual tumor would be endoscopy and biopsies. The pt is agreeable to this. -Continue Protonix to reduce risk of local ulceration. Will send in a refill for this. -Continue Metoprolol. -Continue salt and baking soda mouth washes prn. -   FOLLOW UP: Referral to GI --DrMarc Magod for rpt EGD RTC with Dr Irene Limbo to discuss chemotherapy options in 2 weeks with portflush and labs   All of the patients questions were answered with apparent satisfaction. The patient knows to call the clinic with any problems, questions or concerns.   The total time spent in the appointment was 20 minutes  and more than 50% was on counseling and direct patient cares.   Sullivan Lone MD [place] AAHIVMS [facility] Hematology/Oncology Physician [facility]  (Office):       [phone] (Work cell):  [phone] (Fax):           [phone]  I, Reinaldo Raddle, am acting as scribe for Dr. Sullivan Lone, MD.   .I have reviewed the above documentation for accuracy and completeness, and I agree with the above. Brunetta Genera MD

## 2021-01-01 ENCOUNTER — Inpatient Hospital Stay: Payer: No Typology Code available for payment source

## 2021-01-01 ENCOUNTER — Inpatient Hospital Stay: Payer: No Typology Code available for payment source | Attending: Hematology | Admitting: Hematology

## 2021-01-01 ENCOUNTER — Other Ambulatory Visit: Payer: Self-pay

## 2021-01-01 VITALS — BP 125/62 | HR 70 | Temp 97.9°F | Resp 17 | Ht 72.0 in | Wt 162.5 lb

## 2021-01-01 DIAGNOSIS — C158 Malignant neoplasm of overlapping sites of esophagus: Secondary | ICD-10-CM | POA: Diagnosis not present

## 2021-01-01 DIAGNOSIS — R011 Cardiac murmur, unspecified: Secondary | ICD-10-CM | POA: Diagnosis not present

## 2021-01-01 DIAGNOSIS — C160 Malignant neoplasm of cardia: Secondary | ICD-10-CM | POA: Insufficient documentation

## 2021-01-01 DIAGNOSIS — I10 Essential (primary) hypertension: Secondary | ICD-10-CM | POA: Diagnosis not present

## 2021-01-01 DIAGNOSIS — D500 Iron deficiency anemia secondary to blood loss (chronic): Secondary | ICD-10-CM

## 2021-01-01 DIAGNOSIS — Z79899 Other long term (current) drug therapy: Secondary | ICD-10-CM | POA: Insufficient documentation

## 2021-01-01 DIAGNOSIS — Z95828 Presence of other vascular implants and grafts: Secondary | ICD-10-CM

## 2021-01-01 DIAGNOSIS — E785 Hyperlipidemia, unspecified: Secondary | ICD-10-CM | POA: Insufficient documentation

## 2021-01-01 DIAGNOSIS — I739 Peripheral vascular disease, unspecified: Secondary | ICD-10-CM | POA: Diagnosis not present

## 2021-01-01 LAB — PREALBUMIN: Prealbumin: 18.6 mg/dL (ref 18–38)

## 2021-01-01 LAB — CMP (CANCER CENTER ONLY)
ALT: 13 U/L (ref 0–44)
AST: 20 U/L (ref 15–41)
Albumin: 3.3 g/dL — ABNORMAL LOW (ref 3.5–5.0)
Alkaline Phosphatase: 138 U/L — ABNORMAL HIGH (ref 38–126)
Anion gap: 9 (ref 5–15)
BUN: 12 mg/dL (ref 8–23)
CO2: 25 mmol/L (ref 22–32)
Calcium: 8.7 mg/dL — ABNORMAL LOW (ref 8.9–10.3)
Chloride: 99 mmol/L (ref 98–111)
Creatinine: 0.85 mg/dL (ref 0.61–1.24)
GFR, Estimated: >60 mL/min (ref >60)
Glucose, Bld: 105 mg/dL — ABNORMAL HIGH (ref 70–99)
Potassium: 4.1 mmol/L (ref 3.5–5.1)
Sodium: 133 mmol/L — ABNORMAL LOW (ref 135–145)
Total Bilirubin: 0.5 mg/dL (ref 0.3–1.2)
Total Protein: 6.7 g/dL (ref 6.5–8.1)

## 2021-01-01 LAB — CBC WITH DIFFERENTIAL/PLATELET
Abs Immature Granulocytes: 0.02 10*3/uL (ref 0.00–0.07)
Basophils Absolute: 0 10*3/uL (ref 0.0–0.1)
Basophils Relative: 1 %
Eosinophils Absolute: 0.1 10*3/uL (ref 0.0–0.5)
Eosinophils Relative: 2 %
HCT: 30 % — ABNORMAL LOW (ref 39.0–52.0)
Hemoglobin: 10.5 g/dL — ABNORMAL LOW (ref 13.0–17.0)
Immature Granulocytes: 1 %
Lymphocytes Relative: 18 %
Lymphs Abs: 0.7 10*3/uL (ref 0.7–4.0)
MCH: 32.7 pg (ref 26.0–34.0)
MCHC: 35 g/dL (ref 30.0–36.0)
MCV: 93.5 fL (ref 80.0–100.0)
Monocytes Absolute: 0.5 10*3/uL (ref 0.1–1.0)
Monocytes Relative: 13 %
Neutro Abs: 2.5 10*3/uL (ref 1.7–7.7)
Neutrophils Relative %: 65 %
Platelets: 209 10*3/uL (ref 150–400)
RBC: 3.21 MIL/uL — ABNORMAL LOW (ref 4.22–5.81)
RDW: 14.4 % (ref 11.5–15.5)
WBC: 3.8 10*3/uL — ABNORMAL LOW (ref 4.0–10.5)
nRBC: 0 % (ref 0.0–0.2)

## 2021-01-01 LAB — MAGNESIUM: Magnesium: 1.8 mg/dL (ref 1.7–2.4)

## 2021-01-01 MED ORDER — SODIUM CHLORIDE 0.9% FLUSH
10.0000 mL | Freq: Once | INTRAVENOUS | Status: AC
  Administered 2021-01-01: 10 mL
  Filled 2021-01-01: qty 10

## 2021-01-01 MED ORDER — HEPARIN SOD (PORK) LOCK FLUSH 100 UNIT/ML IV SOLN
500.0000 [IU] | Freq: Once | INTRAVENOUS | Status: AC
  Administered 2021-01-01: 500 [IU]
  Filled 2021-01-01: qty 5

## 2021-01-08 ENCOUNTER — Telehealth: Payer: Self-pay | Admitting: Hematology

## 2021-01-08 ENCOUNTER — Other Ambulatory Visit: Payer: Self-pay

## 2021-01-08 DIAGNOSIS — C158 Malignant neoplasm of overlapping sites of esophagus: Secondary | ICD-10-CM

## 2021-01-08 MED ORDER — PANTOPRAZOLE SODIUM 40 MG PO TBEC: 40.0000 mg | DELAYED_RELEASE_TABLET | Freq: Two times a day (BID) | ORAL | 2 refills | Status: DC

## 2021-01-08 NOTE — Telephone Encounter (Signed)
Scheduled appts per 5/2 sch msg. Pt aware.  

## 2021-01-09 ENCOUNTER — Other Ambulatory Visit: Payer: Self-pay

## 2021-01-09 ENCOUNTER — Telehealth: Payer: Self-pay | Admitting: Dietician

## 2021-01-09 NOTE — Telephone Encounter (Signed)
Nutrition Follow-up:  Patient has completed treatment for gastroesophageal cancer.   Spoke with patient via telephone, reports cleaning his boat after going fishing this morning. Patient reports he is eating pretty well. He complains of "stomach aches" after eating most foods. Reports chocolate seems to aggravate his stomach the most. Yesterday he ate a biscuit with marmalade, sausage, and egg for breakfast, piece of fried chicken for lunch, and church spaghetti with caesar salad for dinner. He is no longer drinking Ensure/boost supplements and maintaining weights. Patient reports usually laying down after meals. Patient reports constipation last week, resolved after Miralax. He reports regular bowel movements every few days. Patient denies nausea, vomiting, diarrhea.       Medications: Protonix  Labs: 4/25 Na 133, Glucose 105, Albumin 3.3  Anthropometrics: Weight 162 lb 8 oz on 4/25 decreased 2.4% in 6 weeks; insignifcant  166 lb 14.4 oz - 3/15 168 lb - 2/14 175 lb - 2/10  NUTRITION DIAGNOSIS: Inadequate oral intake stable   INTERVENTION:  Encouraged small frequent "mini" meals Discussed walking after meal intake and staying upright for at least an hour after eating to aid with digestion Encouraged increased water intake    MONITORING, EVALUATION, GOAL: weight trends, intake   NEXT VISIT: phone call ~4-6 weeks

## 2021-01-10 LAB — SURGICAL PATHOLOGY

## 2021-01-15 ENCOUNTER — Ambulatory Visit
Admission: RE | Admit: 2021-01-15 | Discharge: 2021-01-15 | Disposition: A | Discharge status: Home / Self Care | Payer: No Typology Code available for payment source | Source: Ambulatory Visit | Attending: Radiation Oncology | Admitting: Radiation Oncology

## 2021-01-15 DIAGNOSIS — C158 Malignant neoplasm of overlapping sites of esophagus: Secondary | ICD-10-CM

## 2021-01-15 NOTE — Progress Notes (Signed)
  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 During treatment he did very well with radiotherapy and did not have significant desquamation. He developed expected esophagitis.   Impression/Plan: 1. 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 The patient was unavailable so I left a voicemail and on the message, discussed that we would be happy to continue to follow him as needed, but he will also continue to follow up with Dr. Irene Limbo in medical oncology.      Carola Rhine, PAC

## 2021-01-22 ENCOUNTER — Ambulatory Visit: Payer: No Typology Code available for payment source | Admitting: Hematology

## 2021-01-25 ENCOUNTER — Other Ambulatory Visit: Payer: Self-pay | Admitting: Gastroenterology

## 2021-01-26 ENCOUNTER — Other Ambulatory Visit: Payer: Self-pay

## 2021-01-26 ENCOUNTER — Encounter (HOSPITAL_COMMUNITY): Payer: Self-pay | Admitting: Gastroenterology

## 2021-01-30 ENCOUNTER — Ambulatory Visit (HOSPITAL_COMMUNITY)
Admission: RE | Admit: 2021-01-30 | Discharge: 2021-01-30 | Disposition: A | Discharge status: Home / Self Care | Payer: No Typology Code available for payment source | Attending: Gastroenterology | Admitting: Gastroenterology

## 2021-01-30 ENCOUNTER — Encounter (HOSPITAL_COMMUNITY)
Admission: RE | Disposition: A | Discharge status: Home / Self Care | Payer: Self-pay | Source: Home / Self Care | Attending: Gastroenterology

## 2021-01-30 ENCOUNTER — Ambulatory Visit (HOSPITAL_COMMUNITY): Payer: No Typology Code available for payment source | Admitting: Anesthesiology

## 2021-01-30 ENCOUNTER — Encounter (HOSPITAL_COMMUNITY): Payer: Self-pay | Admitting: Gastroenterology

## 2021-01-30 ENCOUNTER — Other Ambulatory Visit: Payer: Self-pay

## 2021-01-30 DIAGNOSIS — K2289 Other specified disease of esophagus: Secondary | ICD-10-CM | POA: Insufficient documentation

## 2021-01-30 DIAGNOSIS — Z800 Family history of malignant neoplasm of digestive organs: Secondary | ICD-10-CM | POA: Diagnosis not present

## 2021-01-30 DIAGNOSIS — B3781 Candidal esophagitis: Secondary | ICD-10-CM | POA: Diagnosis not present

## 2021-01-30 DIAGNOSIS — C169 Malignant neoplasm of stomach, unspecified: Secondary | ICD-10-CM | POA: Insufficient documentation

## 2021-01-30 DIAGNOSIS — Z7982 Long term (current) use of aspirin: Secondary | ICD-10-CM | POA: Diagnosis not present

## 2021-01-30 DIAGNOSIS — Z880 Allergy status to penicillin: Secondary | ICD-10-CM | POA: Diagnosis not present

## 2021-01-30 DIAGNOSIS — Z79899 Other long term (current) drug therapy: Secondary | ICD-10-CM | POA: Insufficient documentation

## 2021-01-30 DIAGNOSIS — K3189 Other diseases of stomach and duodenum: Secondary | ICD-10-CM | POA: Insufficient documentation

## 2021-01-30 HISTORY — PX: BIOPSY: SHX5522

## 2021-01-30 HISTORY — DX: Presence of other vascular implants and grafts: Z95.828

## 2021-01-30 HISTORY — PX: ESOPHAGOGASTRODUODENOSCOPY (EGD) WITH PROPOFOL: SHX5813

## 2021-01-30 LAB — COMPREHENSIVE METABOLIC PANEL
ALT: 14 U/L (ref 0–44)
AST: 28 U/L (ref 15–41)
Albumin: 3.4 g/dL — ABNORMAL LOW (ref 3.5–5.0)
Alkaline Phosphatase: 168 U/L — ABNORMAL HIGH (ref 38–126)
Anion gap: 7 (ref 5–15)
BUN: 17 mg/dL (ref 8–23)
CO2: 26 mmol/L (ref 22–32)
Calcium: 8.8 mg/dL — ABNORMAL LOW (ref 8.9–10.3)
Chloride: 104 mmol/L (ref 98–111)
Creatinine, Ser: 0.9 mg/dL (ref 0.61–1.24)
GFR, Estimated: >60 mL/min (ref >60)
Glucose, Bld: 96 mg/dL (ref 70–99)
Potassium: 3.9 mmol/L (ref 3.5–5.1)
Sodium: 137 mmol/L (ref 135–145)
Total Bilirubin: 0.7 mg/dL (ref 0.3–1.2)
Total Protein: 6.8 g/dL (ref 6.5–8.1)

## 2021-01-30 LAB — CBC WITH DIFFERENTIAL/PLATELET
Abs Immature Granulocytes: 0.02 10*3/uL (ref 0.00–0.07)
Basophils Absolute: 0 10*3/uL (ref 0.0–0.1)
Basophils Relative: 1 %
Eosinophils Absolute: 0 10*3/uL (ref 0.0–0.5)
Eosinophils Relative: 1 %
HCT: 32.4 % — ABNORMAL LOW (ref 39.0–52.0)
Hemoglobin: 10.7 g/dL — ABNORMAL LOW (ref 13.0–17.0)
Immature Granulocytes: 0 %
Lymphocytes Relative: 12 %
Lymphs Abs: 0.6 10*3/uL — ABNORMAL LOW (ref 0.7–4.0)
MCH: 32.2 pg (ref 26.0–34.0)
MCHC: 33 g/dL (ref 30.0–36.0)
MCV: 97.6 fL (ref 80.0–100.0)
Monocytes Absolute: 0.6 10*3/uL (ref 0.1–1.0)
Monocytes Relative: 13 %
Neutro Abs: 3.7 10*3/uL (ref 1.7–7.7)
Neutrophils Relative %: 73 %
Platelets: 170 10*3/uL (ref 150–400)
RBC: 3.32 MIL/uL — ABNORMAL LOW (ref 4.22–5.81)
RDW: 12.8 % (ref 11.5–15.5)
WBC: 5 10*3/uL (ref 4.0–10.5)
nRBC: 0 % (ref 0.0–0.2)

## 2021-01-30 SURGERY — ESOPHAGOGASTRODUODENOSCOPY (EGD) WITH PROPOFOL
Anesthesia: Monitor Anesthesia Care

## 2021-01-30 MED ORDER — PHENYLEPHRINE HCL (PRESSORS) 10 MG/ML IV SOLN
INTRAVENOUS | Status: DC | PRN
  Administered 2021-01-30 (×3): 40 ug via INTRAVENOUS

## 2021-01-30 MED ORDER — PROPOFOL 10 MG/ML IV BOLUS
INTRAVENOUS | Status: DC | PRN
  Administered 2021-01-30 (×3): 10 mg via INTRAVENOUS

## 2021-01-30 MED ORDER — SODIUM CHLORIDE 0.9 % IV SOLN: INTRAVENOUS | Status: DC

## 2021-01-30 MED ORDER — LACTATED RINGERS IV SOLN: INTRAVENOUS | Status: DC

## 2021-01-30 MED ORDER — HEPARIN SOD (PORK) LOCK FLUSH 100 UNIT/ML IV SOLN
500.0000 [IU] | INTRAVENOUS | Status: AC | PRN
  Administered 2021-01-30: 500 [IU]
  Filled 2021-01-30: qty 5

## 2021-01-30 MED ORDER — PROPOFOL 500 MG/50ML IV EMUL
INTRAVENOUS | Status: DC | PRN
  Administered 2021-01-30: 125 ug/kg/min via INTRAVENOUS

## 2021-01-30 MED ORDER — PROPOFOL 500 MG/50ML IV EMUL
INTRAVENOUS | Status: AC
  Filled 2021-01-30: qty 50

## 2021-01-30 SURGICAL SUPPLY — 14 items

## 2021-01-30 NOTE — Anesthesia Preprocedure Evaluation (Signed)
Anesthesia Evaluation  Patient identified by MRN, date of birth, ID band Patient awake    Reviewed: Allergy & Precautions, NPO status , Patient's Chart, lab work & pertinent test results  Airway Mallampati: II  TM Distance: >3 FB Neck ROM: Full    Dental no notable dental hx.    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) Iron deficiency anemia due to chronic blood loss     Endo/Other  negative endocrine ROS  Renal/GU negative Renal ROS  negative genitourinary   Musculoskeletal negative musculoskeletal ROS (+)   Abdominal   Peds negative pediatric ROS (+)  Hematology negative hematology ROS (+)   Anesthesia Other Findings   Reproductive/Obstetrics negative OB ROS                             Anesthesia Physical Anesthesia Plan  ASA: III  Anesthesia Plan: MAC   Post-op Pain Management:    Induction: Intravenous  PONV Risk Score and Plan: 1 and Propofol infusion and Treatment may vary due to age or medical condition  Airway Management Planned: Simple Face Mask  Additional Equipment:   Intra-op Plan:   Post-operative Plan:   Informed Consent: I have reviewed the patients History and Physical, chart, labs and discussed the procedure including the risks, benefits and alternatives for the proposed anesthesia with the patient or authorized representative who has indicated his/her understanding and acceptance.     Dental advisory given  Plan Discussed with: CRNA and Surgeon  Anesthesia Plan Comments:         Anesthesia Quick Evaluation

## 2021-01-30 NOTE — Progress Notes (Signed)
IV team at bedside to d/c port access.

## 2021-01-30 NOTE — Anesthesia Postprocedure Evaluation (Signed)
Anesthesia Post Note  Patient: Donald Wade  Procedure(s) Performed: ESOPHAGOGASTRODUODENOSCOPY (EGD) WITH PROPOFOL (N/A ) BIOPSY     Patient location during evaluation: PACU Anesthesia Type: MAC Level of consciousness: awake and alert Pain management: pain level controlled Vital Signs Assessment: post-procedure vital signs reviewed and stable Respiratory status: spontaneous breathing, nonlabored ventilation, respiratory function stable and patient connected to nasal cannula oxygen Cardiovascular status: stable and blood pressure returned to baseline Postop Assessment: no apparent nausea or vomiting Anesthetic complications: no   No complications documented.  Last Vitals:  Vitals:   01/30/21 1230 01/30/21 1240  BP: (!) 114/44 (!) 116/53  Pulse: (!) 59 63  Resp: 20 12  Temp: 36.7 C   SpO2: 100% 100%    Last Pain:  Vitals:   01/30/21 1240  TempSrc:   PainSc: 0-No pain                 Daeron Carreno S

## 2021-01-30 NOTE — Progress Notes (Signed)
Order placed to de-access port-a-cath. Pt resting comfortably with no complaints voiced.

## 2021-01-30 NOTE — Transfer of Care (Signed)
Immediate Anesthesia Transfer of Care Note  Patient: Donald Wade  Procedure(s) Performed: ESOPHAGOGASTRODUODENOSCOPY (EGD) WITH PROPOFOL (N/A ) BIOPSY  Patient Location: PACU and Endoscopy Unit  Anesthesia Type:MAC  Level of Consciousness: awake, alert  and patient cooperative  Airway & Oxygen Therapy: Patient Spontanous Breathing and Patient connected to face mask oxygen  Post-op Assessment: Report given to RN and Post -op Vital signs reviewed and stable  Post vital signs: Reviewed and stable  Last Vitals:  Vitals Value Taken Time  BP 114/44 01/30/21 1230  Temp    Pulse 61 01/30/21 1231  Resp 16 01/30/21 1231  SpO2 100 % 01/30/21 1231  Vitals shown include unvalidated device data.  Last Pain:  Vitals:   01/30/21 1059  TempSrc: Oral  PainSc: 0-No pain         Complications: No complications documented.

## 2021-01-30 NOTE — Anesthesia Procedure Notes (Signed)
Procedure Name: MAC Date/Time: 01/30/2021 12:07 PM Performed by: Eben Burow, CRNA Pre-anesthesia Checklist: Patient identified, Emergency Drugs available, Suction available, Patient being monitored and Timeout performed Oxygen Delivery Method: Simple face mask Placement Confirmation: positive ETCO2

## 2021-01-30 NOTE — Op Note (Signed)
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 Estimated Blood Loss:     Estimated blood loss: none. Procedure:                Pre-Anesthesia Assessment:                           - Prior to the procedure, a History and Physical                            was performed, and patient medications and                            allergies were reviewed. The patient's tolerance of                            previous anesthesia was also reviewed. The risks                            and benefits of the procedure and the sedation                            options and risks were discussed with the patient.                            All questions were answered, and informed consent                            was obtained. Prior Anticoagulants: The patient has                            taken no previous anticoagulant or antiplatelet                            agents. ASA Grade Assessment: III - A patient with  severe systemic disease. After reviewing the risks                            and benefits, the patient was deemed in                            satisfactory condition to undergo the procedure.                           After obtaining informed consent, the endoscope was                            passed under direct vision. Throughout the                            procedure, the patient's blood  pressure, pulse, and                            oxygen saturations were monitored continuously. The                            GIF-H190 ([id number] Olympus gastroscope was                            introduced through the mouth, and advanced to the                            second part of duodenum. The upper GI endoscopy was                            accomplished without difficulty. The patient                            tolerated the procedure well. Scope In: Scope Out: Findings:      The larynx was normal.      Minimal Candida esophagitis with no bleeding was found.      A small, ulcerated, partially circumferential (involving one-third of       the lumen circumference) mass with no bleeding and no stigmata of recent       bleeding was found at the gastroesophageal junction and in the cardia.       Biopsies were taken with a cold forceps for histology on both straight       and retroflexed visualization.      Diffuse moderate mucosal changes characterized by erythema and an       increased vascular pattern were found in the entire examined stomach       probably secondary to radiation damage.      The duodenal bulb, first portion of the duodenum and second portion of       the duodenum were normal.      The exam was otherwise without abnormality. Impression:               - Normal larynx.                           -  Minimal Candida candidiasis esophagitis with no                            bleeding.                           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                            Biopsied.                           - Erythematous and increased vascular pattern                            mucosa in the stomach probably secondary to                            radiation.                           - Normal duodenal bulb, first portion of the                            duodenum and second portion of the  duodenum.                           - The examination was otherwise normal. Moderate Sedation:      Not Applicable - Patient had care per Anesthesia. Recommendation:           - Patient has a contact number available for                            emergencies. The signs and symptoms of potential                            delayed complications were discussed with the                            patient. Return to normal activities tomorrow.                            Written discharge instructions were provided to the                            patient.                           - Soft diet today.                           - Continue present medications.                           -  Await pathology results.                           - Return to GI clinic PRN.                           - Telephone GI clinic for pathology results in 1                            week.                           - Telephone GI clinic if symptomatic PRN. Procedure Code(s):        --- Professional ---                           [phone], Esophagogastroduodenoscopy, flexible,                            transoral; with biopsy, single or multiple Diagnosis Code(s):        --- Professional ---                           B37.81, Candidal esophagitis                           D49.0, Neoplasm of unspecified behavior of                            digestive system                           K31.89, Other diseases of stomach and duodenum                           C16.9, Malignant neoplasm of stomach, unspecified CPT copyright 2019 [employer]. All rights reserved. The codes documented in this report are preliminary and upon coder review may  be revised to meet current compliance requirements. Clarene Essex, MD 01/30/2021 12:29:46 PM This report has been signed electronically. Number of Addenda: 0

## 2021-01-30 NOTE — Discharge Instructions (Signed)
YOU HAD AN ENDOSCOPIC PROCEDURE TODAY: Refer to the procedure report and other information in the discharge instructions given to you for any specific questions about what was found during the examination. If this information does not answer your questions, please call [facility] office at [phone] to clarify.   YOU SHOULD EXPECT: Some feelings of bloating in the abdomen. Passage of more gas than usual. Walking can help get rid of the air that was put into your GI tract during the procedure and reduce the bloating. If you had a lower endoscopy (such as a colonoscopy or flexible sigmoidoscopy) you may notice spotting of blood in your stool or on the toilet paper. Some abdominal soreness may be present for a day or two, also.  DIET: Your first meal following the procedure should be a light meal and then it is ok to progress to your normal diet. A half-sandwich or bowl of soup is an example of a good first meal. Heavy or fried foods are harder to digest and may make you feel nauseous or bloated. Drink plenty of fluids but you should avoid alcoholic beverages for 24 hours. If you had a esophageal dilation, please see attached instructions for diet.    ACTIVITY: Your care partner should take you home directly after the procedure. You should plan to take it easy, moving slowly for the rest of the day. You can resume normal activity the day after the procedure however YOU SHOULD NOT DRIVE, use power tools, machinery or perform tasks that involve climbing or major physical exertion for 24 hours (because of the sedation medicines used during the test).   SYMPTOMS TO REPORT IMMEDIATELY: A gastroenterologist can be reached at any hour. Please call [phone]  for any of the following symptoms:  . Following lower endoscopy (colonoscopy, flexible sigmoidoscopy) Excessive amounts of blood in the stool  Significant tenderness, worsening of abdominal pains  Swelling of the abdomen that is new, acute  Fever of 100  or higher  . Following upper endoscopy (EGD, EUS, ERCP, esophageal dilation) Vomiting of blood or coffee ground material  New, significant abdominal pain  New, significant chest pain or pain under the shoulder blades  Painful or persistently difficult swallowing  New shortness of breath  Black, tarry-looking or red, bloody stools  FOLLOW UP:  If any biopsies were taken you will be contacted by phone or by letter within the next 1-3 weeks. Call [phone]  if you have not heard about the biopsies in 3 weeks.  Please also call with any specific questions about appointments or follow up tests. YOU HAD AN ENDOSCOPIC PROCEDURE TODAY: Refer to the procedure report and other information in the discharge instructions given to you for any specific questions about what was found during the examination. If this information does not answer your questions, please call [facility] office at [phone] to clarify.   YOU SHOULD EXPECT: Some feelings of bloating in the abdomen. Passage of more gas than usual. Walking can help get rid of the air that was put into your GI tract during the procedure and reduce the bloating. If you had a lower endoscopy (such as a colonoscopy or flexible sigmoidoscopy) you may notice spotting of blood in your stool or on the toilet paper. Some abdominal soreness may be present for a day or two, also.  DIET: Your first meal following the procedure should be a light meal and then it is ok to progress to your normal diet. A half-sandwich or bowl of soup   is an example of a good first meal. Heavy or fried foods are harder to digest and may make you feel nauseous or bloated. Drink plenty of fluids but you should avoid alcoholic beverages for 24 hours. If you had a esophageal dilation, please see attached instructions for diet.    ACTIVITY: Your care partner should take you home directly after the procedure. You should plan to take it easy, moving slowly for the rest of the day. You can  resume normal activity the day after the procedure however YOU SHOULD NOT DRIVE, use power tools, machinery or perform tasks that involve climbing or major physical exertion for 24 hours (because of the sedation medicines used during the test).   SYMPTOMS TO REPORT IMMEDIATELY: A gastroenterologist can be reached at any hour. Please call [phone]  for any of the following symptoms:   Following upper endoscopy (EGD, EUS, ERCP, esophageal dilation) Vomiting of blood or coffee ground material  New, significant abdominal pain  New, significant chest pain or pain under the shoulder blades  Painful or persistently difficult swallowing  New shortness of breath  Black, tarry-looking or red, bloody stools  FOLLOW UP:  If any biopsies were taken you will be contacted by phone or by letter within the next 1-3 weeks. Call [phone]  if you have not heard about the biopsies in 3 weeks.  Please also call with any specific questions about appointments or follow up tests. Call if question or problem otherwise call for biopsy report as above and follow-up as needed

## 2021-01-30 NOTE — Progress Notes (Signed)
Rose Fillers Loeffler 11:44 AM  Subjective: Patient without any new complaints since he was seen recently in the office and we rediscussed the procedure  Objective: Vital signs stable afebrile exam please see preassessment evaluation recent labs reviewed  Assessment: Metastatic cancer oncology requesting repeat endoscopy to document treatment  Plan: Okay to proceed with endoscopy with anesthesia assistance  [facility]  office [phone] After 5PM or if no answer call [phone]

## 2021-01-31 ENCOUNTER — Encounter (HOSPITAL_COMMUNITY): Payer: Self-pay | Admitting: Gastroenterology

## 2021-01-31 NOTE — Progress Notes (Signed)
HEMATOLOGY/ONCOLOGY CLINIC NOTE  Date of Service: 02/01/2021  Patient Care Team: Berkley Harvey, NP as PCP - General (Nurse Practitioner) Brunetta Genera, MD as Consulting Physician (Hematology) Jonnie Finner, RN as Oncology Nurse Navigator  CHIEF COMPLAINTS/PURPOSE OF CONSULTATION:  GEJ Adenocarcinoma   HISTORY OF PRESENTING ILLNESS:  Donald Wade is a wonderful 65 y.o. 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 Pt is accompanied today by his wife, Donald Wade. The pt reports that he is doing well overall.  The pt reports that he feels better today than he has in a month. Pt was seen by Vascular Surgery for his Peripheral Artery Disease and was placed on Plavix and Cilostazol in September. Pt began having black, tarry stools about 1.5 months ago that he thought was medication-related. He became very lightheaded and dizzy prior to his his hospital admission on 08/21/2020. Pt was given 2 units of PRBC and IV Iron in the hospital. Pt was placed on Protonix twice per day and was taken off of Plavix and Cilostazol prior to discharge.   Pt denies any difficulty swallowing and is taking measures to eat small bites. He has had noticeable acid reflux in the last three months that has worsened recently.   He has Hypertension and Hyperlipidemia. He denies any history of COPD, Emphysema, Allergies, Diabetes, or ongoing heart issues. Pt is up to date with his Colonoscopies and has never had any Prostate issues.    Pt has had Upper Endoscopy completed on 08/22/2020 with results revealing "- Likely malignant esophageal tumor was found in the lower third of the esophagus and at the gastroesophageal junction. Biopsied. 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 - Normal gastric body, antrum and prepyloric region of the stomach. - Normal duodenal bulb, first portion of the duodenum, second portion of the duodenum and third  portion of the duodenum. - The examination was otherwise normal."  Of note prior to the patient's visit today, pt has had Esophageal/Stomach Mass Surgical Pathology [phone]) completed on 08/22/2020 with results revealing "A. ESOPHAGUS, DISTAL MASS, BIOPSY:  - Poorly differentiated adenocarcinoma with extracellular mucin and  signet ring cells. B. STOMACH, MASS, BIOPSY: - Poorly differentiated adenocarcinoma with extracellular mucin and  signet ring cells."   Pt has had CT C/A/P ([id number]) ([id number]) completed on 08/22/2020 with results revealing "1. Thickened appearance of the wall of the stomach adjacent to the gastroesophageal junction as well as proximal gastric wall concerning for infiltrative neoplasm. 2. Rounded gastrohepatic lymph nodes as well as mildly enlarged retroperitoneal lymph nodes concerning for early nodal metastatic disease. 3. No evidence of metastatic disease in the chest. 4. Colonic diverticulosis. 5. Aortic Atherosclerosis."  Most recent lab results (08/23/2020) of CBC is as follows: all values are WNL except for WBC at 3.9K, RBC at 2.93, Hgb at 7.6, HCT at 25.1, MCH at 25.9, RDW at 16.4, Glucose at 101, Calcium at 8.4, Total Protein at 5.8, Albumin at 3.2.  On review of systems, pt denies dysphagia, SOB, abdominal pain, dysuria, back pain, leg swelling and any other symptoms.   On PMHx the pt reports PAD, HTN, HLD, Appendectomy, Left Heart Cath. On Social Hx the pt reports that he is a non-smoker. He drinks 3-4 beers per day on average. Pt denies any other recreational drug use.  On Family Hx the pt reports that his mother had Colon Cancer in her mid-60's and his father had Throat Cancer after an extensive  history of cigar smoking.   INTERVAL HISTORY  Donald Wade is a wonderful 65 y.o. male. The patient's last visit with [place] was on 01/01/2021. He reports he is doing well overall.  The pt reports that he has a lymph node under his arm out and on his shoulder blade  he has noticed. He had his endoscopy recently and they saw radiation changes and still some areas of residual tumors. He had some area of yeast infection there.   Lab results 01/30/2021 of CBC w/diff and CMP is as follows: all values are WNL except for RBC of 3.32, Hgb of 10.7, HCT of 32.4, Lymphs Abs of 0.6K, Calcium of 8.8, Albumin of 3.4, Alkaline Phosphatase of 168.  On review of systems, pt reports lymph node in armpit, fatigue, intermittent abdominal pain and denies black stools/ blood in stools, leg swelling, back pain, and any other symptoms.  MEDICAL HISTORY:  Past Medical History:  Diagnosis Date  . Anemia 08/22/2020  . Heart murmur   . Hyperlipidemia   . Hypertension   . Port-A-Cath in place     SURGICAL HISTORY: Past Surgical History:  Procedure Laterality Date  . APPENDECTOMY  1980  . BIOPSY  08/22/2020   Procedure: BIOPSY;  Surgeon: Clarene Essex, MD;  Location: [facility] ENDOSCOPY;  Service: Endoscopy;;  . BIOPSY  01/30/2021   Procedure: BIOPSY;  Surgeon: Clarene Essex, MD;  Location: [facility] ENDOSCOPY;  Service: Endoscopy;;  . ESOPHAGOGASTRODUODENOSCOPY  08/22/2020  . ESOPHAGOGASTRODUODENOSCOPY N/A 08/22/2020   Procedure: ESOPHAGOGASTRODUODENOSCOPY (EGD);  Surgeon: Clarene Essex, MD;  Location: [place];  Service: Endoscopy;  Laterality: N/A;  . ESOPHAGOGASTRODUODENOSCOPY (EGD) WITH PROPOFOL N/A 01/30/2021   Procedure: ESOPHAGOGASTRODUODENOSCOPY (EGD) WITH PROPOFOL;  Surgeon: Clarene Essex, MD;  Location: [facility] ENDOSCOPY;  Service: Endoscopy;  Laterality: N/A;  . IR IMAGING GUIDED PORT INSERTION  09/07/2020  . LEFT HEART CATH AND CORONARY ANGIOGRAPHY N/A 11/23/2019   Procedure: LEFT HEART CATH AND CORONARY ANGIOGRAPHY;  Surgeon: Nigel Mormon, MD;  Location: [place] CV LAB;  Service: Cardiovascular;  Laterality: N/A;    SOCIAL HISTORY: Social History   Socioeconomic History  . Marital status: Married    Spouse name: Not on file  . Number of children: 2  . Years of  education: Not on file  . Highest education level: Not on file  Occupational History  . Not on file  Tobacco Use  . Smoking status: Never Smoker  . Smokeless tobacco: Never Used  Vaping Use  . Vaping Use: Never used  Substance and Sexual Activity  . Alcohol use: Yes    Comment: 2 beers daily  . Drug use: Never  . Sexual activity: Not on file  Other Topics Concern  . Not on file  Social History Narrative  . Not on file   Social Determinants of Health   Financial Resource Strain: Not on file  Food Insecurity: Not on file  Transportation Needs: Not on file  Physical Activity: Not on file  Stress: Not on file  Social Connections: Not on file  Intimate Partner Violence: Not on file    FAMILY HISTORY: Family History  Problem Relation Age of Onset  . Heart disease Mother   . Atrial fibrillation Mother     ALLERGIES:  is allergic to bee venom and penicillin g.  MEDICATIONS:  Current Outpatient Medications  Medication Sig Dispense Refill  . acetaminophen (TYLENOL) 325 MG tablet Take 325-650 mg by mouth every 6 (six) hours as needed for mild pain.    Marland Kitchen  buPROPion (WELLBUTRIN XL) 150 MG 24 hr tablet Take 150 mg by mouth daily.     . metoprolol succinate (TOPROL XL) 50 MG 24 hr tablet Take 1 tablet (50 mg total) by mouth daily. 90 tablet 2  . Multiple Vitamin (MULTI-VITAMIN) tablet Take 1 tablet by mouth daily.    . pantoprazole (PROTONIX) 40 MG tablet Take 1 tablet (40 mg total) by mouth 2 (two) times daily. 60 tablet 2  . vitamin B-12 (CYANOCOBALAMIN) 500 MCG tablet Take 500 mcg by mouth daily.    Marland Kitchen dexamethasone (DECADRON) 4 MG tablet Take 2 tablets (8 mg total) by mouth daily. Start the day after chemotherapy for 2 days. (Patient not taking: No sig reported) 30 tablet 1  . dronabinol (MARINOL) 2.5 MG capsule Take 1 capsule (2.5 mg total) by mouth 2 (two) times daily before a meal. (Patient not taking: No sig reported) 60 capsule 0  . EPINEPHrine 0.3 mg/0.3 mL IJ SOAJ  injection Inject 0.3 mg into the skin as needed for anaphylaxis (call 911). (Patient not taking: Reported on 02/01/2021)    . feeding supplement (ENSURE ENLIVE / ENSURE PLUS) LIQD Take 237 mLs by mouth 2 (two) times daily between meals. (Patient not taking: Reported on 02/01/2021) 237 mL 12  . fentaNYL (DURAGESIC) 12 MCG/HR Place 2 patches onto the skin every 3 (three) days. (Patient not taking: No sig reported) 20 patch 0  . fexofenadine (ALLEGRA) 180 MG tablet Take 180 mg by mouth daily as needed for allergies. (Patient not taking: Reported on 02/01/2021)    . HYDROmorphone (DILAUDID) 2 MG tablet Take 1 tablet (2 mg total) by mouth every 6 (six) hours as needed for severe pain. (Patient not taking: No sig reported) 60 tablet 0  . lidocaine-prilocaine (EMLA) cream Apply to affected area once (Patient not taking: Reported on 02/01/2021) 30 g 3  . LORazepam (ATIVAN) 0.5 MG tablet Take 1 tablet (0.5 mg total) by mouth every 6 (six) hours as needed (Nausea or vomiting). (Patient not taking: No sig reported) 30 tablet 0  . magic mouthwash w/lidocaine SOLN 1 Part viscous lidocaine 2% 1 Part Maalox 1 Part diphenhydramine 12.5 mg per 5 ml elixir  Use 64m swish and swallow four times a day as needed for throat or esophageal pain from radiation. (Patient not taking: No sig reported) 200 mL 1  . Naphazoline HCl (CLEAR EYES OP) Place 1 drop into both eyes daily as needed (irritation / allergies).    . ondansetron (ZOFRAN) 8 MG tablet Take 1 tablet (8 mg total) by mouth 2 (two) times daily as needed for refractory nausea / vomiting. Start on day 3 after chemo. (Patient not taking: No sig reported) 30 tablet 1  . polyethylene glycol (MIRALAX / GLYCOLAX) 17 g packet Take 17 g by mouth daily as needed for mild constipation. (Patient not taking: Reported on 02/01/2021) 14 each 0  . predniSONE (DELTASONE) 5 MG tablet 6 tab x 1 day, 5 tab x 1 day, 4 tab x 1 day, 3 tab x 1 day, 2 tab x 1 day, 1 tab x 1 day, stop (Patient not  taking: No sig reported) 21 tablet 0  . Probiotic Product (PROBIOTIC PO) Take 1 capsule by mouth daily. (Patient not taking: Reported on 02/01/2021)    . prochlorperazine (COMPAZINE) 10 MG tablet Take 1 tablet (10 mg total) by mouth every 6 (six) hours as needed (Nausea or vomiting). (Patient not taking: No sig reported) 30 tablet 1  . sucralfate (CARAFATE) 1  GM/10ML suspension Take 10 mLs (1 g total) by mouth 2 (two) times daily. (Patient not taking: No sig reported) 420 mL 1  . triamcinolone lotion (KENALOG) 0.1 % Apply 1 application topically 3 (three) times daily. (Patient not taking: No sig reported) 120 mL 3   No current facility-administered medications for this visit.    REVIEW OF SYSTEMS:   10 Point review of Systems was done is negative except as noted above.  PHYSICAL EXAMINATION: ECOG PERFORMANCE STATUS: 1 - Symptomatic but completely ambulatory .BP (!) 147/74 (BP Location: Left Arm, Patient Position: Sitting)   Pulse 66   Temp (!) 96.7 F (35.9 C) (Oral)   Resp 18   Wt 169 lb 6.4 oz (76.8 kg)   SpO2 99%   BMI 22.97 kg/m    GENERAL:alert, in no acute distress and comfortable SKIN: no acute rashes, no significant lesions EYES: conjunctiva are pink and non-injected, sclera anicteric OROPHARYNX: MMM, no exudates, no oropharyngeal erythema or ulceration NECK: supple, no JVD LYMPH:  no palpable lymphadenopathy in the axillary or inguinal regions. Palpable lymph nodes in cervical regions. LUNGS: clear to auscultation b/l with normal respiratory effort HEART: regular rate & rhythm ABDOMEN:  normoactive bowel sounds , non tender, not distended. Extremity: no pedal edema PSYCH: alert & oriented x 3 with fluent speech NEURO: no focal motor/sensory deficits   LABORATORY DATA:  I have reviewed the data as listed  CBC Latest Ref Rng & Units 01/30/2021 01/01/2021 11/21/2020  WBC 4.0 - 10.5 K/uL 5.0 3.8(L) 5.4  Hemoglobin 13.0 - 17.0 g/dL 10.7(L) 10.5(L) 10.6(L)  Hematocrit 39.0  - 52.0 % 32.4(L) 30.0(L) 31.7(L)  Platelets 150 - 400 K/uL 170 209 237  ANC 1.5k  . CMP Latest Ref Rng & Units 01/30/2021 01/01/2021 11/21/2020  Glucose 70 - 99 mg/dL 96 105(H) 121(H)  BUN 8 - 23 mg/dL 17 12 7(L)  Creatinine 0.61 - 1.24 mg/dL 0.90 0.85 0.68  Sodium 135 - 145 mmol/L 137 133(L) 132(L)  Potassium 3.5 - 5.1 mmol/L 3.9 4.1 4.0  Chloride 98 - 111 mmol/L 104 99 100  CO2 22 - 32 mmol/L 26 25 26   Calcium 8.9 - 10.3 mg/dL 8.8(L) 8.7(L) 8.8(L)  Total Protein 6.5 - 8.1 g/dL 6.8 6.7 6.6  Total Bilirubin 0.3 - 1.2 mg/dL 0.7 0.5 0.6  Alkaline Phos 38 - 126 U/L 168(H) 138(H) 127(H)  AST 15 - 41 U/L 28 20 23   ALT 0 - 44 U/L 14 13 22     08/22/2020 Esophageal/Stomach Mass Surgical Pathology [phone]):   01/09/2021 Molecular Pathology    RADIOGRAPHIC STUDIES: I have personally reviewed the radiological images as listed and agreed with the findings in the report. No results found.  ASSESSMENT & PLAN:   65 yo male with no significant smoking or tobacco use h/o but with chronic reflux and etoh use with   1) Recently diagnosed GE junction poorly differentiated with extracellular mucin and signet ring cells. [id number] No overt obstructive symptoms at this time Esophageal/Stomach Mass Surgical Pathology [phone]) which revealed "A. ESOPHAGUS, DISTAL MASS, BIOPSY:  - Poorly differentiated adenocarcinoma with extracellular mucin and  signet ring cells. B. STOMACH, MASS, BIOPSY: - Poorly differentiated adenocarcinoma with extracellular mucin and signet ring cells."  PET/CT 08/31/2020- Wall thickening/mass centered in the gastric cardia, compatible with the patient's known primary neoplasm. This predominantly involves the proximal stomach rather than the distal esophagus. Small gastrohepatic and para-aortic nodes, suspicious for nodal metastases  2) s/p GI bleeding- melena with symptomatic anemia s/p PRBC  transfusion.  No further evidence of GI bleeding at this time. 3) Iron  deficiency anemia from GI bleeding - additional IV iron ordered 4) Abnormal LFTs -- resolved 5) s/p Radiation mucositis   PLAN: -Discussed pt labwork, 01/30/2021; stable. -Advised pt that there was some area of yeast infection. Will prescribe antibiotics for this. -Discussed pt molecular pathology. Advised pt he had 0% of the pdl1 expression and thus would not respond to [facility]. -Discussed surgery. Advised pt that they would not perform surgery since this has moved to the spots in bone and newer lymph nodes. -Discussed palliative care versus supportive care. The pt desires to initiate treatment. -Advised pt the standard treatment is folfox chemotherapy. Advised pt the response rates may be less than 50%. Discussed use of 5FU pump. -Will send referral to [facility] for second opinion and any potential clinical trial opportunities. -Advised pt the primary concern is the aggressive morphology and behavior of his primary tumor, even while he was on treatment prior. -Continue Protonix to reduce risk of local ulceration. Will send in a refill for this. -Continue Metoprolol. -Continue salt and baking soda mouth washes prn. -Rx Fluconazole for yeast infection. -Will see back in 5 weeks with labs w C2D1.   FOLLOW UP: -Please schedule to start FOLFOX with port flush and labs in 1 week and please schedule first 2 cycles/4 doses of FOLFOX chemotherapy. -MD [place] visit with Murray Hodgkins with cycle 1 day 15 for toxicity check -Clinic visit with Dr. Irene Limbo with port flush and labs with cycle 2-day 1 of FOLFOX   All of the patients questions were answered with apparent satisfaction. The patient knows to call the clinic with any problems, questions or concerns.   The total time spent in the appointment was 30 minutes and more than 50% was on counseling and direct patient cares.   Sullivan Lone MD [place] AAHIVMS [facility] Hematology/Oncology Physician [facility]  (Office):       [phone] (Work cell):   [phone] (Fax):           [phone]  I, Reinaldo Raddle, am acting as scribe for Dr. Sullivan Lone, MD.   .I have reviewed the above documentation for accuracy and completeness, and I agree with the above. Brunetta Genera MD

## 2021-02-01 ENCOUNTER — Other Ambulatory Visit: Payer: Self-pay

## 2021-02-01 ENCOUNTER — Inpatient Hospital Stay: Payer: No Typology Code available for payment source | Attending: Hematology

## 2021-02-01 ENCOUNTER — Inpatient Hospital Stay (HOSPITAL_BASED_OUTPATIENT_CLINIC_OR_DEPARTMENT_OTHER): Payer: No Typology Code available for payment source | Admitting: Hematology

## 2021-02-01 VITALS — BP 147/74 | HR 66 | Temp 96.7°F | Resp 18 | Wt 169.4 lb

## 2021-02-01 DIAGNOSIS — C160 Malignant neoplasm of cardia: Secondary | ICD-10-CM | POA: Insufficient documentation

## 2021-02-01 DIAGNOSIS — D500 Iron deficiency anemia secondary to blood loss (chronic): Secondary | ICD-10-CM

## 2021-02-01 DIAGNOSIS — C158 Malignant neoplasm of overlapping sites of esophagus: Secondary | ICD-10-CM | POA: Diagnosis not present

## 2021-02-01 DIAGNOSIS — Z7901 Long term (current) use of anticoagulants: Secondary | ICD-10-CM | POA: Diagnosis not present

## 2021-02-01 DIAGNOSIS — Z7289 Other problems related to lifestyle: Secondary | ICD-10-CM | POA: Diagnosis not present

## 2021-02-01 DIAGNOSIS — I739 Peripheral vascular disease, unspecified: Secondary | ICD-10-CM | POA: Insufficient documentation

## 2021-02-01 DIAGNOSIS — E785 Hyperlipidemia, unspecified: Secondary | ICD-10-CM | POA: Diagnosis not present

## 2021-02-01 DIAGNOSIS — Z95828 Presence of other vascular implants and grafts: Secondary | ICD-10-CM

## 2021-02-01 DIAGNOSIS — Z79899 Other long term (current) drug therapy: Secondary | ICD-10-CM | POA: Insufficient documentation

## 2021-02-01 DIAGNOSIS — Z923 Personal history of irradiation: Secondary | ICD-10-CM | POA: Insufficient documentation

## 2021-02-01 DIAGNOSIS — I10 Essential (primary) hypertension: Secondary | ICD-10-CM | POA: Insufficient documentation

## 2021-02-01 DIAGNOSIS — R011 Cardiac murmur, unspecified: Secondary | ICD-10-CM | POA: Diagnosis not present

## 2021-02-01 DIAGNOSIS — K219 Gastro-esophageal reflux disease without esophagitis: Secondary | ICD-10-CM | POA: Insufficient documentation

## 2021-02-01 MED ORDER — PANTOPRAZOLE SODIUM 40 MG PO TBEC: 40.0000 mg | DELAYED_RELEASE_TABLET | Freq: Two times a day (BID) | ORAL | 2 refills | Status: DC

## 2021-02-01 MED ORDER — SODIUM CHLORIDE 0.9% FLUSH
10.0000 mL | Freq: Once | INTRAVENOUS | Status: DC
  Filled 2021-02-01: qty 10

## 2021-02-01 MED ORDER — HEPARIN SOD (PORK) LOCK FLUSH 100 UNIT/ML IV SOLN
500.0000 [IU] | Freq: Once | INTRAVENOUS | Status: DC
  Filled 2021-02-01: qty 5

## 2021-02-01 NOTE — Progress Notes (Signed)
Pt said he had lab work done and port flush on Tuesday, May 24th. He does not want to be accessed and flushed again today unless the doctor requires it. Message sent to Dr. Irene Limbo and nurse, Ilda Foil.

## 2021-02-02 ENCOUNTER — Other Ambulatory Visit: Payer: Self-pay | Admitting: Hematology

## 2021-02-02 DIAGNOSIS — C158 Malignant neoplasm of overlapping sites of esophagus: Secondary | ICD-10-CM

## 2021-02-06 ENCOUNTER — Encounter: Payer: Self-pay | Admitting: Hematology

## 2021-02-06 ENCOUNTER — Other Ambulatory Visit: Payer: Self-pay | Admitting: Hematology

## 2021-02-06 MED ORDER — FLUCONAZOLE 100 MG PO TABS: 100.0000 mg | ORAL_TABLET | Freq: Every day | ORAL | 1 refills | Status: DC

## 2021-02-06 NOTE — Progress Notes (Signed)
Fluconazole prescription for candidal esophagitis

## 2021-02-07 ENCOUNTER — Encounter: Payer: Self-pay | Admitting: Hematology

## 2021-02-08 ENCOUNTER — Other Ambulatory Visit: Payer: Self-pay

## 2021-02-08 DIAGNOSIS — C158 Malignant neoplasm of overlapping sites of esophagus: Secondary | ICD-10-CM

## 2021-02-08 LAB — SURGICAL PATHOLOGY

## 2021-02-08 NOTE — Progress Notes (Signed)
Referral sent to Dr Dennison Nancy at [facility] per pt's wife request. Referral faxed to : [phone]

## 2021-02-09 ENCOUNTER — Other Ambulatory Visit: Payer: Self-pay | Admitting: *Deleted

## 2021-02-09 DIAGNOSIS — C158 Malignant neoplasm of overlapping sites of esophagus: Secondary | ICD-10-CM

## 2021-02-09 MED ORDER — PANTOPRAZOLE SODIUM 40 MG PO TBEC: 1.0000 | DELAYED_RELEASE_TABLET | Freq: Two times a day (BID) | ORAL | 2 refills | Status: DC

## 2021-02-12 ENCOUNTER — Telehealth: Payer: Self-pay

## 2021-02-12 NOTE — Telephone Encounter (Signed)
Pt's wife called requesting a prescription for an antibiotic for pt's carbuncles. Pt mentioned this to Dr Irene Limbo at last appointment but pt has developed more "spots". States pt has one under his arm and one on his chin. Will defer to Dr Dorsey/ Tedra Coupe [facility] for plan for care.

## 2021-02-13 ENCOUNTER — Other Ambulatory Visit: Payer: Self-pay | Admitting: Hematology

## 2021-02-13 DIAGNOSIS — Z7189 Other specified counseling: Secondary | ICD-10-CM

## 2021-02-13 NOTE — Progress Notes (Signed)
DISCONTINUE ON PATHWAY REGIMEN - Gastroesophageal     Administer weekly during RT:     Paclitaxel      Carboplatin   **Always confirm dose/schedule in your pharmacy ordering system**  REASON: Disease Progression PRIOR TREATMENT: [id number]: Carboplatin + Paclitaxel (2/50) Weekly (x 5-6 Weeks) with Concurrent RT TREATMENT RESPONSE: Partial Response (PR)  START ON PATHWAY REGIMEN - Gastroesophageal     A cycle is every 14 days:     Oxaliplatin      Leucovorin      Fluorouracil      Fluorouracil   **Always confirm dose/schedule in your pharmacy ordering system**  Patient Characteristics: Distant Metastases (cM1/pM1) / Locally Recurrent Disease, Adenocarcinoma - Esophageal, GE Junction, and Gastric, First Line, HER2 Negative/Unknown, PD?L1 Expression CPS < 5/Negative/Unknown, MSS/pMMR or MSI Unknown Histology: Adenocarcinoma Disease Classification: GE Junction Therapeutic Status: Distant Metastases (No Additional Staging) Line of Therapy: First Line HER2 Status: Awaiting Test Results PD-L1 Expression Status: PD-L1 Expression CPS < 5 Microsatellite/Mismatch Repair Status: Unknown Intent of Therapy: Non-Curative / Palliative Intent, Discussed with Patient

## 2021-02-13 NOTE — Telephone Encounter (Signed)
I recommend patient gets evaluated by PCP or urgent care.

## 2021-02-14 ENCOUNTER — Telehealth: Payer: Self-pay | Admitting: Dietician

## 2021-02-14 NOTE — Telephone Encounter (Signed)
Nutrition Follow-up:  Patient has completed treatment for gastroesophageal cancer now with progression to bone and lymph. Plans to start chemotherapy with FOLFOX.   Spoke with patient via telephone. He reports maintaining weights, but  appetite has been poor. Pt is not taking appetite stimulant. He says his "stomach is doing better" with protonix and pepcid. Yesterday he ate a fruit cup with scrambled egg for breakfast, grilled cheese for lunch, and tv dinner (meatloaf, potatoes). He is drinking a lot of water. Patient has some Boost at home, he has not been drinking it. He reports ongoing constipation, keeps track of BMs on calender and takes laxative if needed. He reports having small BM on 6/7. Patient reports he has an appointment with [facility] on 6/15 for second opinion, states he does not feel like they will have any better news him. Patient reports he has good days and bad days with "terminal diagnosis" He is involved with his support groups - reports a member calling in on the other line and needing to answer. Patient says people call him "all the time" and it "gets to be too much sometimes"   Medications: reviewed  Labs: reviewed  Anthropometrics: Weight 169 lb 6.4 oz on 5/26 increased from 165 lb on 5/24   4/25- 162 8 oz 3/15 - 166 lb 14.4 oz 2/14 - 168 lb 8 oz  NUTRITION DIAGNOSIS: Inadequate oral intake ongoing    INTERVENTION:  Encouraged small frequent meals and snacks  Reviewed strategies for constipation Patient will drink 2 Boost Plus daily Offered supportive listening, pt politely declined additional [facility] support services     MONITORING, EVALUATION, GOAL: weight trends, intake   NEXT VISIT: via telephone ~4-5 weeks

## 2021-02-20 ENCOUNTER — Other Ambulatory Visit: Payer: Self-pay

## 2021-02-20 ENCOUNTER — Encounter (HOSPITAL_COMMUNITY): Payer: Self-pay

## 2021-02-20 ENCOUNTER — Inpatient Hospital Stay (HOSPITAL_COMMUNITY)
Admission: EM | Admit: 2021-02-20 | Discharge: 2021-02-22 | DRG: 378 | Disposition: A | Discharge status: Home / Self Care | Payer: No Typology Code available for payment source | Attending: Internal Medicine | Admitting: Internal Medicine

## 2021-02-20 DIAGNOSIS — F32A Depression, unspecified: Secondary | ICD-10-CM | POA: Diagnosis present

## 2021-02-20 DIAGNOSIS — D649 Anemia, unspecified: Secondary | ICD-10-CM | POA: Diagnosis not present

## 2021-02-20 DIAGNOSIS — Z79899 Other long term (current) drug therapy: Secondary | ICD-10-CM

## 2021-02-20 DIAGNOSIS — N179 Acute kidney failure, unspecified: Secondary | ICD-10-CM | POA: Diagnosis present

## 2021-02-20 DIAGNOSIS — Z20822 Contact with and (suspected) exposure to covid-19: Secondary | ICD-10-CM | POA: Diagnosis present

## 2021-02-20 DIAGNOSIS — Z9049 Acquired absence of other specified parts of digestive tract: Secondary | ICD-10-CM

## 2021-02-20 DIAGNOSIS — K921 Melena: Principal | ICD-10-CM | POA: Insufficient documentation

## 2021-02-20 DIAGNOSIS — Z880 Allergy status to penicillin: Secondary | ICD-10-CM

## 2021-02-20 DIAGNOSIS — K922 Gastrointestinal hemorrhage, unspecified: Secondary | ICD-10-CM | POA: Diagnosis present

## 2021-02-20 DIAGNOSIS — Z9221 Personal history of antineoplastic chemotherapy: Secondary | ICD-10-CM

## 2021-02-20 DIAGNOSIS — K5900 Constipation, unspecified: Secondary | ICD-10-CM | POA: Diagnosis present

## 2021-02-20 DIAGNOSIS — Z923 Personal history of irradiation: Secondary | ICD-10-CM

## 2021-02-20 DIAGNOSIS — E785 Hyperlipidemia, unspecified: Secondary | ICD-10-CM | POA: Diagnosis present

## 2021-02-20 DIAGNOSIS — Z515 Encounter for palliative care: Secondary | ICD-10-CM

## 2021-02-20 DIAGNOSIS — D62 Acute posthemorrhagic anemia: Secondary | ICD-10-CM | POA: Diagnosis present

## 2021-02-20 DIAGNOSIS — I10 Essential (primary) hypertension: Secondary | ICD-10-CM | POA: Diagnosis present

## 2021-02-20 DIAGNOSIS — Z91030 Bee allergy status: Secondary | ICD-10-CM

## 2021-02-20 DIAGNOSIS — Z8249 Family history of ischemic heart disease and other diseases of the circulatory system: Secondary | ICD-10-CM

## 2021-02-20 DIAGNOSIS — C160 Malignant neoplasm of cardia: Secondary | ICD-10-CM | POA: Diagnosis present

## 2021-02-20 DIAGNOSIS — Z8501 Personal history of malignant neoplasm of esophagus: Secondary | ICD-10-CM

## 2021-02-20 HISTORY — DX: Malignant (primary) neoplasm, unspecified: C80.1

## 2021-02-20 LAB — CBC WITH DIFFERENTIAL/PLATELET
Abs Immature Granulocytes: 0.25 10*3/uL — ABNORMAL HIGH (ref 0.00–0.07)
Basophils Absolute: 0 10*3/uL (ref 0.0–0.1)
Basophils Relative: 0 %
Eosinophils Absolute: 0.1 10*3/uL (ref 0.0–0.5)
Eosinophils Relative: 1 %
HCT: 16 % — ABNORMAL LOW (ref 39.0–52.0)
Hemoglobin: 5.4 g/dL — CL (ref 13.0–17.0)
Immature Granulocytes: 4 %
Lymphocytes Relative: 13 %
Lymphs Abs: 0.9 10*3/uL (ref 0.7–4.0)
MCH: 32.7 pg (ref 26.0–34.0)
MCHC: 33.8 g/dL (ref 30.0–36.0)
MCV: 97 fL (ref 80.0–100.0)
Monocytes Absolute: 0.6 10*3/uL (ref 0.1–1.0)
Monocytes Relative: 8 %
Neutro Abs: 5.1 10*3/uL (ref 1.7–7.7)
Neutrophils Relative %: 74 %
Platelets: 145 10*3/uL — ABNORMAL LOW (ref 150–400)
RBC: 1.65 MIL/uL — ABNORMAL LOW (ref 4.22–5.81)
RDW: 13.5 % (ref 11.5–15.5)
WBC: 6.9 10*3/uL (ref 4.0–10.5)
nRBC: 0.3 % — ABNORMAL HIGH (ref 0.0–0.2)

## 2021-02-20 LAB — COMPREHENSIVE METABOLIC PANEL
ALT: 11 U/L (ref 0–44)
AST: 42 U/L — ABNORMAL HIGH (ref 15–41)
Albumin: 2.9 g/dL — ABNORMAL LOW (ref 3.5–5.0)
Alkaline Phosphatase: 240 U/L — ABNORMAL HIGH (ref 38–126)
Anion gap: 8 (ref 5–15)
BUN: 31 mg/dL — ABNORMAL HIGH (ref 8–23)
CO2: 26 mmol/L (ref 22–32)
Calcium: 8.2 mg/dL — ABNORMAL LOW (ref 8.9–10.3)
Chloride: 101 mmol/L (ref 98–111)
Creatinine, Ser: 1.33 mg/dL — ABNORMAL HIGH (ref 0.61–1.24)
GFR, Estimated: >60 mL/min (ref >60)
Glucose, Bld: 109 mg/dL — ABNORMAL HIGH (ref 70–99)
Potassium: 3.8 mmol/L (ref 3.5–5.1)
Sodium: 135 mmol/L (ref 135–145)
Total Bilirubin: 0.3 mg/dL (ref 0.3–1.2)
Total Protein: 6 g/dL — ABNORMAL LOW (ref 6.5–8.1)

## 2021-02-20 LAB — PROTIME-INR
INR: 1.3 — ABNORMAL HIGH (ref 0.8–1.2)
Prothrombin Time: 16.1 seconds — ABNORMAL HIGH (ref 11.4–15.2)

## 2021-02-20 LAB — APTT: aPTT: 46 seconds — ABNORMAL HIGH (ref 24–36)

## 2021-02-20 LAB — PREPARE RBC (CROSSMATCH)

## 2021-02-20 LAB — SARS CORONAVIRUS 2 (TAT 6-24 HRS): SARS Coronavirus 2: NEGATIVE

## 2021-02-20 MED ORDER — ONDANSETRON HCL 4 MG/2ML IJ SOLN
4.0000 mg | Freq: Four times a day (QID) | INTRAMUSCULAR | Status: DC | PRN
  Administered 2021-02-21: 4 mg via INTRAVENOUS
  Filled 2021-02-20: qty 2

## 2021-02-20 MED ORDER — MORPHINE SULFATE (PF) 2 MG/ML IV SOLN
2.0000 mg | INTRAVENOUS | Status: DC | PRN
  Administered 2021-02-21: 2 mg via INTRAVENOUS
  Filled 2021-02-20: qty 1

## 2021-02-20 MED ORDER — SODIUM CHLORIDE 0.9 % IV SOLN: INTRAVENOUS | Status: DC

## 2021-02-20 MED ORDER — PANTOPRAZOLE SODIUM 40 MG IV SOLR: 40.0000 mg | Freq: Two times a day (BID) | INTRAVENOUS | Status: DC

## 2021-02-20 MED ORDER — SODIUM CHLORIDE 0.9 % IV SOLN
10.0000 mL/h | Freq: Once | INTRAVENOUS | Status: AC
  Administered 2021-02-20: 10 mL/h via INTRAVENOUS

## 2021-02-20 MED ORDER — ACETAMINOPHEN 325 MG PO TABS
650.0000 mg | ORAL_TABLET | Freq: Four times a day (QID) | ORAL | Status: DC | PRN
  Administered 2021-02-21: 650 mg via ORAL
  Filled 2021-02-20: qty 2

## 2021-02-20 MED ORDER — SENNOSIDES-DOCUSATE SODIUM 8.6-50 MG PO TABS: 1.0000 | ORAL_TABLET | Freq: Every evening | ORAL | Status: DC | PRN

## 2021-02-20 MED ORDER — SODIUM CHLORIDE 0.9 % IV SOLN
8.0000 mg/h | INTRAVENOUS | Status: DC
  Administered 2021-02-20 – 2021-02-21 (×2): 8 mg/h via INTRAVENOUS
  Filled 2021-02-20 (×4): qty 80

## 2021-02-20 MED ORDER — BUPROPION HCL ER (XL) 150 MG PO TB24
150.0000 mg | ORAL_TABLET | Freq: Every day | ORAL | Status: DC
  Administered 2021-02-22: 150 mg via ORAL
  Filled 2021-02-20: qty 1

## 2021-02-20 MED ORDER — ACETAMINOPHEN 650 MG RE SUPP: 650.0000 mg | Freq: Four times a day (QID) | RECTAL | Status: DC | PRN

## 2021-02-20 MED ORDER — ONDANSETRON HCL 4 MG PO TABS: 4.0000 mg | ORAL_TABLET | Freq: Four times a day (QID) | ORAL | Status: DC | PRN

## 2021-02-20 MED ORDER — PANTOPRAZOLE 80MG IVPB - SIMPLE MED
80.0000 mg | Freq: Once | INTRAVENOUS | Status: AC
  Administered 2021-02-20: 80 mg via INTRAVENOUS
  Filled 2021-02-20: qty 100

## 2021-02-20 NOTE — H&P (Signed)
History and Physical  Donald Wade BWG:[id number] DOB: [birth year] DOA: 02/20/2021  PCP: Berkley Harvey, NP Patient coming from: Home   I have personally briefly reviewed patient's old medical records in [place]   Chief Complaint: Weakness, dark tarry stool.  HPI: Donald Wade is a 65 y.o. male past medical history of gastroesophageal junction adenocarcinoma, endoscopy 12/14 2021 revealed likely malignant esophageal tumor was found in the lower third of the esophagus and at the esophageal junction.  Likely malignant gastric tumor in the cardia.  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  Hypertension, hyperlipidemia, since diagnosis patient has received 28 days of radiation and 4 several chemotherapy treatment. He was suppose to have follow up tomorrow at [facility] for second opinion.  He presents with weakness, and with episode of dark tarry stool. He has been constipated, so he took dulcolax and after started having dark tarry stool, and notice small amount of blood. He also report dark stool on wednesday and thursday.  Patient was taking doxycycline for nodule in his neck. He took it for 4 days. Nodule has decrease in size last 48 hours.   Of note; endoscopy  01/31/2022; showed small ulcerated partially  circumferential mas at GE junction.   Evaluation in the ED; sodium 135 K 3.8, Cr 1.33 alkaline phosphate 240, AST 42, Hb 5.4 platelet 145, WBC 6.9. INR; 1.3    Review of Systems: All systems reviewed and apart from history of presenting illness, are negative.  Past Medical History:  Diagnosis Date   Anemia 08/22/2020   Cancer ([place])    Heart murmur    Hyperlipidemia    Hypertension    Port-A-Cath in place    Past Surgical History:  Procedure Laterality Date   APPENDECTOMY  1980   BIOPSY  08/22/2020   Procedure: BIOPSY;   Surgeon: Clarene Essex, MD;  Location: [place];  Service: Endoscopy;;   BIOPSY  01/30/2021   Procedure: BIOPSY;  Surgeon: Clarene Essex, MD;  Location: [facility];  Service: Endoscopy;;   ESOPHAGOGASTRODUODENOSCOPY  08/22/2020   ESOPHAGOGASTRODUODENOSCOPY N/A 08/22/2020   Procedure: ESOPHAGOGASTRODUODENOSCOPY (EGD);  Surgeon: Clarene Essex, MD;  Location: [place];  Service: Endoscopy;  Laterality: N/A;   ESOPHAGOGASTRODUODENOSCOPY (EGD) WITH PROPOFOL N/A 01/30/2021   Procedure: ESOPHAGOGASTRODUODENOSCOPY (EGD) WITH PROPOFOL;  Surgeon: Clarene Essex, MD;  [facility];  Service: Endoscopy;  Laterality: N/A;   IR IMAGING GUIDED PORT INSERTION  09/07/2020   LEFT HEART CATH AND CORONARY ANGIOGRAPHY N/A 11/23/2019   Procedure: LEFT HEART CATH AND CORONARY ANGIOGRAPHY;  Surgeon: Nigel Mormon, MD;  Location: [place] [facility];  Service: Cardiovascular;  Laterality: N/A;   Social History:  reports that he has never smoked. He has never used smokeless tobacco. He reports current alcohol use. He reports that he does not use drugs.   Allergies  Allergen Reactions   Bee Venom Anaphylaxis   Penicillin G Anaphylaxis, Hives and Swelling    Did it involve swelling of the face/tongue/throat, SOB, or low BP? Yes Did it involve sudden or severe rash/hives, skin peeling, or any reaction on the inside of your mouth or nose? Yes Did you need to seek medical attention at a hospital or doctor's office? Yes When did it last happen?      1990 If all above answers are "NO", may proceed with cephalosporin use.    Family History  Problem Relation Age of  Onset   Heart disease Mother    Atrial fibrillation Mother     Prior to Admission medications   Medication Sig Start Date End Date Taking? Authorizing Provider  acetaminophen (TYLENOL) 325 MG tablet Take 325-650 mg by mouth every 6 (six) hours as needed for mild pain.   Yes [provider]  bisacodyl (DULCOLAX) 5 MG EC tablet Take  10 mg by mouth daily as needed for moderate constipation.   Yes [provider]  buPROPion (WELLBUTRIN XL) 150 MG 24 hr tablet Take 150 mg by mouth daily.  12/17/16  Yes [provider]  doxycycline (VIBRAMYCIN) 100 MG capsule Take 100 mg by mouth 2 (two) times daily. 02/13/21  Yes [provider]  EPINEPHrine 0.3 mg/0.3 mL IJ SOAJ injection Inject 0.3 mg into the skin as needed for anaphylaxis (call 911). 05/01/16  Yes [provider]  famotidine (PEPCID) 20 MG tablet Take 20 mg by mouth 2 (two) times daily as needed for heartburn or indigestion.   Yes [provider]  fexofenadine (ALLEGRA) 180 MG tablet Take 180 mg by mouth daily as needed for allergies. 11/30/09  Yes [provider]  metoprolol succinate (TOPROL XL) 50 MG 24 hr tablet Take 1 tablet (50 mg total) by mouth daily. 03/06/20  Yes Patwardhan, Manish J, MD  Naphazoline HCl (CLEAR EYES OP) Place 1 drop into both eyes daily as needed (irritation / allergies).   Yes [provider]  pantoprazole (PROTONIX) 40 MG tablet Take 1 tablet (40 mg total) by mouth 2 (two) times daily. 02/09/21  Yes Brunetta Genera, MD  vitamin B-12 (CYANOCOBALAMIN) 500 MCG tablet Take 500 mcg by mouth daily.   Yes [provider]  dronabinol (MARINOL) 2.5 MG capsule Take 1 capsule (2.5 mg total) by mouth 2 (two) times daily before a meal. Patient not taking: No sig reported 10/19/20   Brunetta Genera, MD  feeding supplement (ENSURE ENLIVE / ENSURE PLUS) LIQD Take 237 mLs by mouth 2 (two) times daily between meals. Patient not taking: No sig reported 08/23/20   Liberty Seto A, MD  fentaNYL (DURAGESIC) 12 MCG/HR Place 2 patches onto the skin every 3 (three) days. Patient not taking: No sig reported 10/19/20   Brunetta Genera, MD  fluconazole (DIFLUCAN) 100 MG tablet Take 1 tablet (100 mg total) by mouth daily. Patient not taking: No sig reported 02/06/21   Brunetta Genera, MD   HYDROmorphone (DILAUDID) 2 MG tablet Take 1 tablet (2 mg total) by mouth every 6 (six) hours as needed for severe pain. Patient not taking: No sig reported 10/31/20   Brunetta Genera, MD  magic mouthwash w/lidocaine SOLN 1 Part viscous lidocaine 2% 1 Part Maalox 1 Part diphenhydramine 12.5 mg per 5 ml elixir  Use 22ml swish and swallow four times a day as needed for throat or esophageal pain from radiation. Patient not taking: No sig reported 10/17/20   Brunetta Genera, MD  polyethylene glycol (MIRALAX / GLYCOLAX) 17 g packet Take 17 g by mouth daily as needed for mild constipation. Patient not taking: No sig reported 08/23/20   Erlene Devita A, MD  predniSONE (DELTASONE) 5 MG tablet 6 tab x 1 day, 5 tab x 1 day, 4 tab x 1 day, 3 tab x 1 day, 2 tab x 1 day, 1 tab x 1 day, stop Patient not taking: No sig reported 09/26/20   Harle Stanford., PA-C  sucralfate (CARAFATE) 1 GM/10ML suspension Take 10 mLs (  1 g total) by mouth 2 (two) times daily. Patient not taking: No sig reported 09/25/20   Brunetta Genera, MD  triamcinolone lotion (KENALOG) 0.1 % Apply 1 application topically 3 (three) times daily. Patient not taking: No sig reported 09/26/20   Harle Stanford., PA-C  prochlorperazine (COMPAZINE) 10 MG tablet Take 1 tablet (10 mg total) by mouth every 6 (six) hours as needed (Nausea or vomiting). Patient not taking: No sig reported 08/28/20 02/13/21  Brunetta Genera, MD   Physical Exam: Vitals:   02/20/21 1453 02/20/21 1600  BP: (!) 147/68 (!) 147/66  Pulse: 88 83  Resp: 16 16  Temp: 97.9 F (36.6 C)   TempSrc: Oral   SpO2: 98% 100%    General exam: Moderately built and nourished patient, lying comfortably supine on the gurney in no obvious distress. Head, eyes and ENT: Nontraumatic and normocephalic. Pupils equally reacting to light and accommodation. Oral mucosa moist.small indurated nodule neck anterior.  Neck: Supple. No JVD, carotid bruit or thyromegaly. Lymphatics:  No lymphadenopathy. Respiratory system: Clear to auscultation. No increased work of breathing. Cardiovascular system: S1 and S2 heard, RRR. No JVD, murmurs, gallops, clicks or pedal edema. Gastrointestinal system: Abdomen is nondistended, soft and nontender. Normal bowel sounds heard. No organomegaly or masses appreciated. Central nervous system: Alert and oriented. No focal neurological deficits. Extremities: Symmetric 5 x 5 power. Peripheral pulses symmetrically felt.  Skin: No rashes or acute findings. Musculoskeletal system: Negative exam. Psychiatry: Pleasant and cooperative.   Labs on Admission:  Basic Metabolic Panel: Recent Labs  Lab 02/20/21 1525  NA 135  K 3.8  CL 101  CO2 26  GLUCOSE 109*  BUN 31*  CREATININE 1.33*  CALCIUM 8.2*   Liver Function Tests: Recent Labs  Lab 02/20/21 1525  AST 42*  ALT 11  ALKPHOS 240*  BILITOT 0.3  PROT 6.0*  ALBUMIN 2.9*   No results for input(s): LIPASE, AMYLASE in the last 168 hours. No results for input(s): AMMONIA in the last 168 hours. CBC: Recent Labs  Lab 02/20/21 1525  WBC 6.9  NEUTROABS 5.1  HGB 5.4*  HCT 16.0*  MCV 97.0  PLT 145*   Cardiac Enzymes: No results for input(s): CKTOTAL, CKMB, CKMBINDEX, TROPONINI in the last 168 hours.  BNP (last 3 results) No results for input(s): PROBNP in the last 8760 hours. CBG: No results for input(s): GLUCAP in the last 168 hours.  Radiological Exams on Admission: No results found.    Assessment/Plan Active Problems:   GI bleed   1-GI bleed, acute blood loss anemia; likely secondary to GE junction cancer;  -Admit to telemetry -Plan to transfuse 3 units PRBC.  -Start IV protonix gtt.  [facility] consulted.  -Clear diet, NPO after midnight.  -Monitor Hb.  -Palliative care consulted.   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   Per Dr Irene Limbo patent is not candidate for Sx do metastasis to bone and lymph nodes.  Patient suppose  to follow up at [facility] for second opinion and potential clinical trial. He is suppose to follow up with Dr Irene Limbo for more chemo. Patient during last clinic office decline palliative care.  -Plan to consult palliative care for goals care.  Dr Irene Limbo added to treatment team.    HTN; He has not been taking metoprolol.  Hold medication in setting of GI bleed.   Depression;  Continue with Wellbutrin.   AKI; in setting of hypoperfusion.  Monitor.  Blood transfusion.  Screening for Covid Pending.     DVT Prophylaxis: SCD Code Status: partial Code, no intubation. Yes to medication CPR, defibrillator.  Family Communication: care discussed with patient.  Disposition Plan: Admit under observation.   Time spent: 75 minutes    I expect that the patient will require at least 2 midnights in the hospital to treat this condition.  Elmarie Shiley MD [facility]   02/20/2021, 5:11 PM

## 2021-02-20 NOTE — ED Provider Notes (Signed)
[facility] DEPT Provider Note   CSN: [id number] Arrival date & time: 02/20/21  1406     History Chief Complaint  Patient presents with   Rectal Bleeding    Donald Wade is a 66 y.o. male.  HPI     65 year old male comes in a chief complaint of rectal bleeding.  Patient reports that 3 days ago he had an episode of dark tarry stool.  He noticed dark stool again earlier today after he took Dulcolax for constipation.  When he wiped, he also noted some bright red blood.  Patient reports some weakness, shortness of breath with exertion.  He was diagnosed with gastric cancer in December, stage IV, currently not getting any treatment.  Past Medical History:  Diagnosis Date   Anemia 08/22/2020   Cancer ([place])    Heart murmur    Hyperlipidemia    Hypertension    Port-A-Cath in place     Patient Active Problem List   Diagnosis Date Noted   Goals of care, counseling/discussion 02/13/2021   Port-A-Cath in place 11/21/2020   Iron deficiency anemia due to chronic blood loss 08/29/2020   Malignant neoplasm of overlapping sites of esophagus ([place]) 08/28/2020   GI bleed 08/22/2020   Symptomatic anemia 08/21/2020   Acute blood loss anemia 08/21/2020   Hypertriglyceridemia 06/08/2020   Mixed hyperlipidemia 03/06/2020   Elevated coronary artery calcium score 11/12/2019   Family history of early CAD 10/13/2019   Exertional dyspnea 10/13/2019   Essential hypertension 10/13/2019   PAD (peripheral artery disease) ([place]) 10/13/2019    Past Surgical History:  Procedure Laterality Date   APPENDECTOMY  1980   BIOPSY  08/22/2020   Procedure: BIOPSY;  Surgeon: Clarene Essex, MD;  Location: [place];  Service: Endoscopy;;   BIOPSY  01/30/2021   Procedure: BIOPSY;  Surgeon: Clarene Essex, MD;  [facility];  Service: Endoscopy;;   ESOPHAGOGASTRODUODENOSCOPY  08/22/2020   ESOPHAGOGASTRODUODENOSCOPY N/A 08/22/2020   Procedure:  ESOPHAGOGASTRODUODENOSCOPY (EGD);  Surgeon: Clarene Essex, MD;  Location: [place];  Service: Endoscopy;  Laterality: N/A;   ESOPHAGOGASTRODUODENOSCOPY (EGD) WITH PROPOFOL N/A 01/30/2021   Procedure: ESOPHAGOGASTRODUODENOSCOPY (EGD) WITH PROPOFOL;  Surgeon: Clarene Essex, MD;  [facility];  Service: Endoscopy;  Laterality: N/A;   IR IMAGING GUIDED PORT INSERTION  09/07/2020   LEFT HEART CATH AND CORONARY ANGIOGRAPHY N/A 11/23/2019   Procedure: LEFT HEART CATH AND CORONARY ANGIOGRAPHY;  Surgeon: Nigel Mormon, MD;  Location: [place] [facility];  Service: Cardiovascular;  Laterality: N/A;       Family History  Problem Relation Age of Onset   Heart disease Mother    Atrial fibrillation Mother     Social History   Tobacco Use   Smoking status: Never   Smokeless tobacco: Never  Vaping Use   Vaping Use: Never used  Substance Use Topics   Alcohol use: Yes    Comment: 2 beers daily   Drug use: Never    Home Medications Prior to Admission medications   Medication Sig Start Date End Date Taking? Authorizing Provider  acetaminophen (TYLENOL) 325 MG tablet Take 325-650 mg by mouth every 6 (six) hours as needed for mild pain.   Yes [provider]  bisacodyl (DULCOLAX) 5 MG EC tablet Take 10 mg by mouth daily as needed for moderate constipation.   Yes [provider]  buPROPion (WELLBUTRIN XL) 150 MG 24 hr tablet Take 150 mg by mouth daily.  12/17/16  Yes [provider]  doxycycline (VIBRAMYCIN) 100 MG capsule Take 100 mg by mouth 2 (two) times daily. 02/13/21  Yes [provider]  EPINEPHrine 0.3 mg/0.3 mL IJ SOAJ injection Inject 0.3 mg into the skin as needed for anaphylaxis (call 911). 05/01/16  Yes [provider]  famotidine (PEPCID) 20 MG tablet Take 20 mg by mouth 2 (two) times daily as needed for heartburn or indigestion.   Yes [provider]  fexofenadine (ALLEGRA) 180 MG tablet Take 180 mg by mouth daily as needed  for allergies. 11/30/09  Yes [provider]  metoprolol succinate (TOPROL XL) 50 MG 24 hr tablet Take 1 tablet (50 mg total) by mouth daily. 03/06/20  Yes Patwardhan, Manish J, MD  Naphazoline HCl (CLEAR EYES OP) Place 1 drop into both eyes daily as needed (irritation / allergies).   Yes [provider]  pantoprazole (PROTONIX) 40 MG tablet Take 1 tablet (40 mg total) by mouth 2 (two) times daily. 02/09/21  Yes Brunetta Genera, MD  vitamin B-12 (CYANOCOBALAMIN) 500 MCG tablet Take 500 mcg by mouth daily.   Yes [provider]  dronabinol (MARINOL) 2.5 MG capsule Take 1 capsule (2.5 mg total) by mouth 2 (two) times daily before a meal. Patient not taking: No sig reported 10/19/20   Brunetta Genera, MD  feeding supplement (ENSURE ENLIVE / ENSURE PLUS) LIQD Take 237 mLs by mouth 2 (two) times daily between meals. Patient not taking: No sig reported 08/23/20   Regalado, Belkys A, MD  fentaNYL (DURAGESIC) 12 MCG/HR Place 2 patches onto the skin every 3 (three) days. Patient not taking: No sig reported 10/19/20   Brunetta Genera, MD  fluconazole (DIFLUCAN) 100 MG tablet Take 1 tablet (100 mg total) by mouth daily. Patient not taking: No sig reported 02/06/21   Brunetta Genera, MD  HYDROmorphone (DILAUDID) 2 MG tablet Take 1 tablet (2 mg total) by mouth every 6 (six) hours as needed for severe pain. Patient not taking: No sig reported 10/31/20   Brunetta Genera, MD  magic mouthwash w/lidocaine SOLN 1 Part viscous lidocaine 2% 1 Part Maalox 1 Part diphenhydramine 12.5 mg per 5 ml elixir  Use 53ml swish and swallow four times a day as needed for throat or esophageal pain from radiation. Patient not taking: No sig reported 10/17/20   Brunetta Genera, MD  polyethylene glycol (MIRALAX / GLYCOLAX) 17 g packet Take 17 g by mouth daily as needed for mild constipation. Patient not taking: No sig reported 08/23/20   Regalado, Belkys A, MD  predniSONE (DELTASONE) 5  MG tablet 6 tab x 1 day, 5 tab x 1 day, 4 tab x 1 day, 3 tab x 1 day, 2 tab x 1 day, 1 tab x 1 day, stop Patient not taking: No sig reported 09/26/20   Harle Stanford., PA-C  sucralfate (CARAFATE) 1 GM/10ML suspension Take 10 mLs (1 g total) by mouth 2 (two) times daily. Patient not taking: No sig reported 09/25/20   Brunetta Genera, MD  triamcinolone lotion (KENALOG) 0.1 % Apply 1 application topically 3 (three) times daily. Patient not taking: No sig reported 09/26/20   Harle Stanford., PA-C  prochlorperazine (COMPAZINE) 10 MG tablet Take 1 tablet (10 mg total) by mouth every 6 (six) hours as needed (Nausea or vomiting). Patient not taking: No sig reported 08/28/20 02/13/21  Brunetta Genera, MD    Allergies    Bee venom and Penicillin g  Review of Systems   Review  of Systems  Constitutional:  Positive for activity change and fatigue.  Respiratory:  Positive for shortness of breath.   Gastrointestinal:  Positive for blood in stool.  Allergic/Immunologic: Negative for immunocompromised state.  Hematological:  Does not bruise/bleed easily.  All other systems reviewed and are negative.  Physical Exam Updated Vital Signs BP (!) 147/66   Pulse 83   Temp 97.9 F (36.6 C) (Oral)   Resp 16   SpO2 100%   Physical Exam Vitals and nursing note reviewed.  Constitutional:      Appearance: He is well-developed.  HENT:     Head: Atraumatic.  Cardiovascular:     Rate and Rhythm: Normal rate.  Pulmonary:     Effort: Pulmonary effort is normal.  Musculoskeletal:     Cervical back: Neck supple.  Skin:    General: Skin is warm.  Neurological:     Mental Status: He is alert and oriented to person, place, and time.    ED Results / Procedures / Treatments   Labs (all labs ordered are listed, but only abnormal results are displayed) Labs Reviewed  CBC WITH DIFFERENTIAL/PLATELET - Abnormal; Notable for the following components:      Result Value   RBC 1.65 (*)    Hemoglobin 5.4 (*)     HCT 16.0 (*)    Platelets 145 (*)    nRBC 0.3 (*)    Abs Immature Granulocytes 0.25 (*)    All other components within normal limits  COMPREHENSIVE METABOLIC PANEL - Abnormal; Notable for the following components:   Glucose, Bld 109 (*)    BUN 31 (*)    Creatinine, Ser 1.33 (*)    Calcium 8.2 (*)    Total Protein 6.0 (*)    Albumin 2.9 (*)    AST 42 (*)    Alkaline Phosphatase 240 (*)    All other components within normal limits  APTT - Abnormal; Notable for the following components:   aPTT 46 (*)    All other components within normal limits  PROTIME-INR - Abnormal; Notable for the following components:   Prothrombin Time 16.1 (*)    INR 1.3 (*)    All other components within normal limits  SARS CORONAVIRUS 2 (TAT 6-24 HRS)  OCCULT BLOOD X 1 CARD TO LAB, STOOL  TYPE AND SCREEN  PREPARE RBC (CROSSMATCH)    EKG None  Radiology No results found.  Procedures .Critical Care  Date/Time: 02/20/2021 4:50 PM Performed by: Varney Biles, MD Authorized by: Varney Biles, MD   Critical care provider statement:    Critical care time (minutes):  52   Critical care was necessary to treat or prevent imminent or life-threatening deterioration of the following conditions:  Circulatory failure   Critical care was time spent personally by me on the following activities:  Discussions with consultants, evaluation of patient's response to treatment, examination of patient, ordering and performing treatments and interventions, ordering and review of laboratory studies, ordering and review of radiographic studies, pulse oximetry, re-evaluation of patient's condition, obtaining history from patient or surrogate and review of old charts   Medications Ordered in ED Medications  0.9 %  sodium chloride infusion (has no administration in time range)    ED Course  I have reviewed the triage vital signs and the nursing notes.  Pertinent labs & imaging results that were available during my  care of the patient were reviewed by me and considered in my medical decision making (see chart for details).  MDM Rules/Calculators/A&P                          64 year old comes in with chief complaint of bloody stools.  On exam, patient did have melena and he is Hemoccult positive. Patient has known history of gastric cancer.  Endoscopy from 5-24 reviewed.  It appears that patient is symptomatic from his anemia.  He does look pale.  We will check labs and reassess.  4:50 PM Hemoglobin is 5.4.  Will order 3 units of PRBC.  GI consulted.  Medicine will admit.  Final Clinical Impression(s) / ED Diagnoses Final diagnoses:  Symptomatic anemia  Melena    Rx / DC Orders ED Discharge Orders     None        Varney Biles, MD 02/20/21 1650

## 2021-02-20 NOTE — ED Triage Notes (Signed)
Pt states he had constipation for 3-4 days, took Ducolax this morning had bowel movement shortly after, states he saw small amount of red blood on toilet paper, none in bowl. Denies nausea, vomiting or abdominal pain. Hx of Esophageal cancer with Mets.

## 2021-02-21 ENCOUNTER — Encounter (HOSPITAL_COMMUNITY)
Admission: EM | Disposition: A | Discharge status: Home / Self Care | Payer: Self-pay | Source: Home / Self Care | Attending: Internal Medicine

## 2021-02-21 ENCOUNTER — Observation Stay (HOSPITAL_COMMUNITY): Payer: No Typology Code available for payment source | Admitting: Anesthesiology

## 2021-02-21 ENCOUNTER — Encounter (HOSPITAL_COMMUNITY): Payer: Self-pay | Admitting: Internal Medicine

## 2021-02-21 DIAGNOSIS — K922 Gastrointestinal hemorrhage, unspecified: Secondary | ICD-10-CM | POA: Diagnosis not present

## 2021-02-21 DIAGNOSIS — Z8501 Personal history of malignant neoplasm of esophagus: Secondary | ICD-10-CM | POA: Diagnosis not present

## 2021-02-21 DIAGNOSIS — Z20822 Contact with and (suspected) exposure to covid-19: Secondary | ICD-10-CM | POA: Diagnosis present

## 2021-02-21 DIAGNOSIS — Z8249 Family history of ischemic heart disease and other diseases of the circulatory system: Secondary | ICD-10-CM | POA: Diagnosis not present

## 2021-02-21 DIAGNOSIS — Z880 Allergy status to penicillin: Secondary | ICD-10-CM | POA: Diagnosis not present

## 2021-02-21 DIAGNOSIS — C160 Malignant neoplasm of cardia: Secondary | ICD-10-CM | POA: Diagnosis present

## 2021-02-21 DIAGNOSIS — E785 Hyperlipidemia, unspecified: Secondary | ICD-10-CM | POA: Diagnosis present

## 2021-02-21 DIAGNOSIS — Z9221 Personal history of antineoplastic chemotherapy: Secondary | ICD-10-CM | POA: Diagnosis not present

## 2021-02-21 DIAGNOSIS — I10 Essential (primary) hypertension: Secondary | ICD-10-CM | POA: Diagnosis not present

## 2021-02-21 DIAGNOSIS — K921 Melena: Secondary | ICD-10-CM | POA: Diagnosis present

## 2021-02-21 DIAGNOSIS — D62 Acute posthemorrhagic anemia: Secondary | ICD-10-CM | POA: Diagnosis present

## 2021-02-21 DIAGNOSIS — N179 Acute kidney failure, unspecified: Secondary | ICD-10-CM

## 2021-02-21 DIAGNOSIS — Z79899 Other long term (current) drug therapy: Secondary | ICD-10-CM | POA: Diagnosis not present

## 2021-02-21 DIAGNOSIS — K5900 Constipation, unspecified: Secondary | ICD-10-CM | POA: Diagnosis present

## 2021-02-21 DIAGNOSIS — Z91030 Bee allergy status: Secondary | ICD-10-CM | POA: Diagnosis not present

## 2021-02-21 DIAGNOSIS — Z515 Encounter for palliative care: Secondary | ICD-10-CM | POA: Diagnosis not present

## 2021-02-21 DIAGNOSIS — Z923 Personal history of irradiation: Secondary | ICD-10-CM | POA: Diagnosis not present

## 2021-02-21 DIAGNOSIS — F32A Depression, unspecified: Secondary | ICD-10-CM | POA: Diagnosis present

## 2021-02-21 DIAGNOSIS — Z9049 Acquired absence of other specified parts of digestive tract: Secondary | ICD-10-CM | POA: Diagnosis not present

## 2021-02-21 HISTORY — PX: ESOPHAGOGASTRODUODENOSCOPY: SHX5428

## 2021-02-21 HISTORY — PX: SUBMUCOSAL INJECTION: SHX5543

## 2021-02-21 LAB — CBC
HCT: 24.5 % — ABNORMAL LOW (ref 39.0–52.0)
Hemoglobin: 8.5 g/dL — ABNORMAL LOW (ref 13.0–17.0)
MCH: 31.5 pg (ref 26.0–34.0)
MCHC: 34.7 g/dL (ref 30.0–36.0)
MCV: 90.7 fL (ref 80.0–100.0)
Platelets: 109 10*3/uL — ABNORMAL LOW (ref 150–400)
RBC: 2.7 MIL/uL — ABNORMAL LOW (ref 4.22–5.81)
RDW: 14.6 % (ref 11.5–15.5)
WBC: 7 10*3/uL (ref 4.0–10.5)
nRBC: 0.4 % — ABNORMAL HIGH (ref 0.0–0.2)

## 2021-02-21 LAB — COMPREHENSIVE METABOLIC PANEL
ALT: 11 U/L (ref 0–44)
AST: 37 U/L (ref 15–41)
Albumin: 2.7 g/dL — ABNORMAL LOW (ref 3.5–5.0)
Alkaline Phosphatase: 235 U/L — ABNORMAL HIGH (ref 38–126)
Anion gap: 9 (ref 5–15)
BUN: 28 mg/dL — ABNORMAL HIGH (ref 8–23)
CO2: 23 mmol/L (ref 22–32)
Calcium: 8.1 mg/dL — ABNORMAL LOW (ref 8.9–10.3)
Chloride: 102 mmol/L (ref 98–111)
Creatinine, Ser: 1.54 mg/dL — ABNORMAL HIGH (ref 0.61–1.24)
GFR, Estimated: 50 mL/min — ABNORMAL LOW (ref >60)
Glucose, Bld: 97 mg/dL (ref 70–99)
Potassium: 3.6 mmol/L (ref 3.5–5.1)
Sodium: 134 mmol/L — ABNORMAL LOW (ref 135–145)
Total Bilirubin: 0.8 mg/dL (ref 0.3–1.2)
Total Protein: 5.5 g/dL — ABNORMAL LOW (ref 6.5–8.1)

## 2021-02-21 LAB — HEMOGLOBIN AND HEMATOCRIT, BLOOD
HCT: 24.8 % — ABNORMAL LOW (ref 39.0–52.0)
HCT: 23.3 % — ABNORMAL LOW (ref 39.0–52.0)
Hemoglobin: 8.5 g/dL — ABNORMAL LOW (ref 13.0–17.0)
Hemoglobin: 7.9 g/dL — ABNORMAL LOW (ref 13.0–17.0)

## 2021-02-21 LAB — HIV ANTIBODY (ROUTINE TESTING W REFLEX): HIV Screen 4th Generation wRfx: NONREACTIVE

## 2021-02-21 SURGERY — EGD (ESOPHAGOGASTRODUODENOSCOPY)
Anesthesia: Monitor Anesthesia Care

## 2021-02-21 MED ORDER — PROPOFOL 500 MG/50ML IV EMUL
INTRAVENOUS | Status: DC | PRN
  Administered 2021-02-21: 150 ug/kg/min via INTRAVENOUS

## 2021-02-21 MED ORDER — LIDOCAINE 2% (20 MG/ML) 5 ML SYRINGE
INTRAMUSCULAR | Status: DC | PRN
  Administered 2021-02-21: 40 mg via INTRAVENOUS

## 2021-02-21 MED ORDER — PANTOPRAZOLE SODIUM 40 MG PO TBEC
40.0000 mg | DELAYED_RELEASE_TABLET | Freq: Two times a day (BID) | ORAL | Status: DC
  Administered 2021-02-21 – 2021-02-22 (×2): 40 mg via ORAL
  Filled 2021-02-21 (×2): qty 1

## 2021-02-21 MED ORDER — PHENYLEPHRINE HCL (PRESSORS) 10 MG/ML IV SOLN
INTRAVENOUS | Status: DC | PRN
  Administered 2021-02-21: 80 ug via INTRAVENOUS

## 2021-02-21 MED ORDER — EPHEDRINE SULFATE 50 MG/ML IJ SOLN
INTRAMUSCULAR | Status: DC | PRN
  Administered 2021-02-21: 10 mg via INTRAVENOUS

## 2021-02-21 MED ORDER — SODIUM CHLORIDE (PF) 0.9 % IJ SOLN
PREFILLED_SYRINGE | INTRAMUSCULAR | Status: DC | PRN
  Administered 2021-02-21: 7 mL

## 2021-02-21 MED ORDER — CHLORHEXIDINE GLUCONATE CLOTH 2 % EX PADS
6.0000 | MEDICATED_PAD | Freq: Every day | CUTANEOUS | Status: DC
  Administered 2021-02-21 – 2021-02-22 (×2): 6 via TOPICAL

## 2021-02-21 MED ORDER — SODIUM CHLORIDE 0.9% FLUSH
10.0000 mL | INTRAVENOUS | Status: DC | PRN
  Administered 2021-02-21 – 2021-02-22 (×2): 10 mL

## 2021-02-21 MED ORDER — SUCRALFATE 1 G PO TABS
1.0000 g | ORAL_TABLET | Freq: Three times a day (TID) | ORAL | Status: DC
  Administered 2021-02-21 – 2021-02-22 (×3): 1 g via ORAL
  Filled 2021-02-21 (×3): qty 1

## 2021-02-21 MED ORDER — EPINEPHRINE 1 MG/10ML IJ SOSY
PREFILLED_SYRINGE | INTRAMUSCULAR | Status: AC
  Filled 2021-02-21: qty 10

## 2021-02-21 MED ORDER — LACTATED RINGERS IV SOLN: INTRAVENOUS | Status: DC | PRN

## 2021-02-21 MED ORDER — PROPOFOL 10 MG/ML IV BOLUS
INTRAVENOUS | Status: DC | PRN
  Administered 2021-02-21: 50 mg via INTRAVENOUS
  Administered 2021-02-21: 20 mg via INTRAVENOUS

## 2021-02-21 MED ORDER — SODIUM CHLORIDE 0.9 % IV SOLN: INTRAVENOUS | Status: DC

## 2021-02-21 MED ORDER — ONDANSETRON HCL 4 MG/2ML IJ SOLN
INTRAMUSCULAR | Status: DC | PRN
  Administered 2021-02-21: 4 mg via INTRAVENOUS

## 2021-02-21 MED ORDER — SODIUM CHLORIDE 0.9% FLUSH
10.0000 mL | Freq: Two times a day (BID) | INTRAVENOUS | Status: DC
  Administered 2021-02-21: 10 mL

## 2021-02-21 NOTE — Transfer of Care (Signed)
Immediate Anesthesia Transfer of Care Note  Patient: Donald Wade  Procedure(s) Performed: ESOPHAGOGASTRODUODENOSCOPY (EGD) SUBMUCOSAL INJECTION  Patient Location: PACU  Anesthesia Type:MAC  Level of Consciousness: awake, alert , oriented and patient cooperative  Airway & Oxygen Therapy: Patient Spontanous Breathing and Patient connected to nasal cannula oxygen  Post-op Assessment: Report given to RN and Post -op Vital signs reviewed and stable  Post vital signs: Reviewed and stable  Last Vitals:  Vitals Value Taken Time  BP    Temp    Pulse 83 02/21/21 1128  Resp 19 02/21/21 1128  SpO2 98 % 02/21/21 1128  Vitals shown include unvalidated device data.  Last Pain:  Vitals:   02/21/21 1035  TempSrc: Oral  PainSc: 0-No pain         Complications: No notable events documented.

## 2021-02-21 NOTE — Anesthesia Postprocedure Evaluation (Signed)
Anesthesia Post Note  Patient: Donald Wade  Procedure(s) Performed: ESOPHAGOGASTRODUODENOSCOPY (EGD) SUBMUCOSAL INJECTION     Patient location during evaluation: PACU Anesthesia Type: MAC Level of consciousness: awake and alert and oriented Pain management: pain level controlled Vital Signs Assessment: post-procedure vital signs reviewed and stable Respiratory status: spontaneous breathing, nonlabored ventilation and respiratory function stable Cardiovascular status: blood pressure returned to baseline Postop Assessment: no apparent nausea or vomiting Anesthetic complications: no   No notable events documented.       Brennan Bailey

## 2021-02-21 NOTE — Op Note (Signed)
[facility] Patient Name: Donald Wade Procedure Date: 02/21/2021 MRN: [id number] Attending MD: Ronnette Juniper , MD Date of Birth: Apr 30, 1956 CSN: [id number] Age: 65 Admit Type: Outpatient Procedure:                Upper GI endoscopy Indications:              Acute post hemorrhagic anemia, Melena, Personal                            history of malignant gastric neoplasm Providers:                Ronnette Juniper, MD, Burtis Junes, RN, Benetta Spar,                            Technician Referring MD:             [facility] Medicines:                Monitored Anesthesia Care Complications:            No immediate complications. Estimated blood loss:                            Minimal. Estimated Blood Loss:     Estimated blood loss was minimal. Procedure:                Pre-Anesthesia Assessment:                           - Prior to the procedure, a History and Physical                            was performed, and patient medications and                            allergies were reviewed. The patient's tolerance of                            previous anesthesia was also reviewed. The risks                            and benefits of the procedure and the sedation                            options and risks were discussed with the patient.                            All questions were answered, and informed consent                            was obtained. Prior Anticoagulants: The patient has                            taken no previous anticoagulant or antiplatelet                            agents. ASA  Grade Assessment: III - A patient with                            severe systemic disease. After reviewing the risks                            and benefits, the patient was deemed in                            satisfactory condition to undergo the procedure.                           After obtaining informed consent, the endoscope was                            passed  under direct vision. Throughout the                            procedure, the patient's blood pressure, pulse, and                            oxygen saturations were monitored continuously. The                            GIF-H190 ([id number]) was introduced through the                            mouth, and advanced to the second part of duodenum.                            The upper GI endoscopy was accomplished without                            difficulty. The patient tolerated the procedure                            well. Scope In: Scope Out: Findings:      The upper third of the esophagus and middle third of the esophagus were       normal.      A fungating and ulcerating mass with no bleeding and no stigmata of       recent bleeding was found at the gastroesophageal junction. The mass was       non-obstructing and circumferential.      Diffuse severe mucosal changes characterized by nodularity and       ulceration were found in the cardia, in the gastric fundus, on the       greater curvature of the stomach, on the lesser curvature of the       stomach, in the gastric antrum and at the pylorus compatible with known       gastric cancer. The mucosa was friable and frank oozing was noted       especially from the antrum with clots which could be washed. Area was       successfully injected with 7 mL of a 1:10,000 solution of epinephrine  for hemostasis.      The examined duodenum was normal. Impression:               - Normal upper third of esophagus and middle third                            of esophagus.                           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                           - Nodular and ulcerated mucosa in the cardia,                            gastric fundus, greater curvature, lesser                            curvature, antrum and pylorus compatible with known                             gastric cancer. Injected.                           - Normal examined duodenum.                           - No specimens collected. Moderate Sedation:      Patient did not receive moderate sedation for this procedure, but       instead received monitored anesthesia care. Recommendation:           - Advance diet as tolerated.                           - Continue present medications.                           - Pantoprazole 40 mg twice a day indefinitely.                           Carafate 1 gm four times a day likely indefinitely.                           - Patient will likely have more bleeding from                            underlying gastric cancer which is not amenable to                            endoscopic therapy.                           - Recommend H and H monitoring and transfusion for  support.                           - Further management as per oncology. Procedure Code(s):        --- Professional ---                           [phone], Esophagogastroduodenoscopy, flexible,                            transoral; with control of bleeding, any method Diagnosis Code(s):        --- Professional ---                           C16.0, Malignant neoplasm of cardia                           K31.89, Other diseases of stomach and duodenum                           K25.9, Gastric ulcer, unspecified as acute or                            chronic, without hemorrhage or perforation                           D62, Acute posthemorrhagic anemia                           K92.1, Melena (includes Hematochezia) CPT copyright 2019 American [employer] Association. All rights reserved. The codes documented in this report are preliminary and upon coder review may  be revised to meet current compliance requirements. Ronnette Juniper, MD 02/21/2021 11:33:04 AM This report has been signed electronically. Number of Addenda: 0

## 2021-02-21 NOTE — Anesthesia Preprocedure Evaluation (Addendum)
Anesthesia Evaluation  Patient identified by MRN, date of birth, ID band Patient awake    Reviewed: Allergy & Precautions, NPO status , Patient's Chart, lab work & pertinent test results  History of Anesthesia Complications Negative for: history of anesthetic complications  Airway Mallampati: II  TM Distance: >3 FB Neck ROM: Full    Dental no notable dental hx.    Pulmonary neg pulmonary ROS,    Pulmonary exam normal        Cardiovascular hypertension, + CAD and + Peripheral Vascular Disease  Normal cardiovascular exam     Neuro/Psych negative neurological ROS  negative psych ROS   GI/Hepatic Neg liver ROS, UGIB, gastric ca   Endo/Other  negative endocrine ROS  Renal/GU ARFRenal disease (Cr 1.54)  negative genitourinary   Musculoskeletal negative musculoskeletal ROS (+)   Abdominal   Peds  Hematology  (+) anemia , Hgb 8.5, plt 109k   Anesthesia Other Findings Day of surgery medications reviewed with patient.  Reproductive/Obstetrics negative OB ROS                            Anesthesia Physical Anesthesia Plan  ASA: 3  Anesthesia Plan: MAC   Post-op Pain Management:    Induction:   PONV Risk Score and Plan: 1 and Treatment may vary due to age or medical condition and Propofol infusion  Airway Management Planned: Natural Airway and Nasal Cannula  Additional Equipment: None  Intra-op Plan:   Post-operative Plan:   Informed Consent: I have reviewed the patients History and Physical, chart, labs and discussed the procedure including the risks, benefits and alternatives for the proposed anesthesia with the patient or authorized representative who has indicated his/her understanding and acceptance.   Patient has DNR.  Discussed DNR with patient and Suspend DNR.     Plan Discussed with: CRNA  Anesthesia Plan Comments: (Patient has DNI status in EMR. Discussed with patient,  he would like to be FULL CODE for procedure and understands that although intubation is most likely not necessary, it will be performed as needed periprocedurally. Daiva Huge, MD)       Anesthesia Quick Evaluation

## 2021-02-21 NOTE — Progress Notes (Signed)
Palliative care consult received.    Chart reviewed.  Attempted visit but patient off the floor for procedure.   Will attempt f/u for initial consult this afternoon vs tomorrow.  Micheline Rough, MD [facility] Team [phone]  NO CHARGE NOTE

## 2021-02-21 NOTE — Consult Note (Signed)
Referring Provider: [facility] Primary Care Physician:  Berkley Harvey, NP Primary Gastroenterologist:  Dr. Watt Climes [facility])  Reason for Consultation:  Upper GI Bleeding  HPI: Donald Wade is a 65 y.o. male with past medical history of GEJ adenocarcinoma presenting for consultation of upper GI bleeding.  He states that last week on Tuesday and Wednesday 6/7 and 6/8 he had melenic stools, which he thought was related to recent antibiotic use (doxycycline for neck nodule).  He then did not have any further bowel movements and took dulcolax yesterday, which caused him to have several black, tarry stools, thus he presented to the ED. On arrival, Hgb was 5.4, decreased from 10.7 on 5/24.  He also has had associated nausea with coffee ground emesis this morning.  He denies abdominal pain.  Weight has remained stable over the past month, though he has a poor appetite.  EGD 01/30/2021: malignant gastric tumor at the gastroesophageal junction and in the cardia (bx:  poorly differentiated adenocarcinoma), Erythematous and increased vascular pattern mucosa in the stomach probably secondary to radiation.  Past Medical History:  Diagnosis Date  . Anemia 08/22/2020  . Cancer ([place])   . Heart murmur   . Hyperlipidemia   . Hypertension   . Port-A-Cath in place     Past Surgical History:  Procedure Laterality Date  . APPENDECTOMY  1980  . BIOPSY  08/22/2020   Procedure: BIOPSY;  Surgeon: Clarene Essex, MD;  Location: [facility] ENDOSCOPY;  Service: Endoscopy;;  . BIOPSY  01/30/2021   Procedure: BIOPSY;  Surgeon: Clarene Essex, MD;  Location: [facility] ENDOSCOPY;  Service: Endoscopy;;  . ESOPHAGOGASTRODUODENOSCOPY  08/22/2020  . ESOPHAGOGASTRODUODENOSCOPY N/A 08/22/2020   Procedure: ESOPHAGOGASTRODUODENOSCOPY (EGD);  Surgeon: Clarene Essex, MD;  Location: [facility];  Service: Endoscopy;  Laterality: N/A;  . ESOPHAGOGASTRODUODENOSCOPY (EGD) WITH PROPOFOL N/A 01/30/2021   Procedure: ESOPHAGOGASTRODUODENOSCOPY (EGD) WITH  PROPOFOL;  Surgeon: Clarene Essex, MD;  Location: [facility] ENDOSCOPY;  Service: Endoscopy;  Laterality: N/A;  . IR IMAGING GUIDED PORT INSERTION  09/07/2020  . LEFT HEART CATH AND CORONARY ANGIOGRAPHY N/A 11/23/2019   Procedure: LEFT HEART CATH AND CORONARY ANGIOGRAPHY;  Surgeon: Nigel Mormon, MD;  Location: [facility] CV LAB;  Service: Cardiovascular;  Laterality: N/A;    Prior to Admission medications   Medication Sig Start Date End Date Taking? Authorizing Provider  acetaminophen (TYLENOL) 325 MG tablet Take 325-650 mg by mouth every 6 (six) hours as needed for mild pain.   Yes [provider]  bisacodyl (DULCOLAX) 5 MG EC tablet Take 10 mg by mouth daily as needed for moderate constipation.   Yes [provider]  buPROPion (WELLBUTRIN XL) 150 MG 24 hr tablet Take 150 mg by mouth daily.  12/17/16  Yes [provider]  doxycycline (VIBRAMYCIN) 100 MG capsule Take 100 mg by mouth 2 (two) times daily. 02/13/21  Yes [provider]  EPINEPHrine 0.3 mg/0.3 mL IJ SOAJ injection Inject 0.3 mg into the skin as needed for anaphylaxis (call 911). 05/01/16  Yes [provider]  famotidine (PEPCID) 20 MG tablet Take 20 mg by mouth 2 (two) times daily as needed for heartburn or indigestion.   Yes [provider]  fexofenadine (ALLEGRA) 180 MG tablet Take 180 mg by mouth daily as needed for allergies. 11/30/09  Yes [provider]  metoprolol succinate (TOPROL XL) 50 MG 24 hr tablet Take 1 tablet (50 mg total) by mouth daily. 03/06/20  Yes Patwardhan, Reynold Bowen, MD  Naphazoline HCl (CLEAR EYES  OP) Place 1 drop into both eyes daily as needed (irritation / allergies).   Yes [provider]  pantoprazole (PROTONIX) 40 MG tablet Take 1 tablet (40 mg total) by mouth 2 (two) times daily. 02/09/21  Yes Brunetta Genera, MD  vitamin B-12 (CYANOCOBALAMIN) 500 MCG tablet Take 500 mcg by mouth daily.   Yes [provider]  feeding supplement  (ENSURE ENLIVE / ENSURE PLUS) LIQD Take 237 mLs by mouth 2 (two) times daily between meals. Patient not taking: No sig reported 08/23/20   Regalado, Jerald Kief A, MD  magic mouthwash w/lidocaine SOLN 1 Part viscous lidocaine 2% 1 Part Maalox 1 Part diphenhydramine 12.5 mg per 5 ml elixir  Use 77ml swish and swallow four times a day as needed for throat or esophageal pain from radiation. Patient not taking: No sig reported 10/17/20   Brunetta Genera, MD  polyethylene glycol (MIRALAX / GLYCOLAX) 17 g packet Take 17 g by mouth daily as needed for mild constipation. Patient not taking: No sig reported 08/23/20   Regalado, Belkys A, MD  predniSONE (DELTASONE) 5 MG tablet 6 tab x 1 day, 5 tab x 1 day, 4 tab x 1 day, 3 tab x 1 day, 2 tab x 1 day, 1 tab x 1 day, stop Patient not taking: No sig reported 09/26/20   Harle Stanford., PA-C  sucralfate (CARAFATE) 1 GM/10ML suspension Take 10 mLs (1 g total) by mouth 2 (two) times daily. Patient not taking: No sig reported 09/25/20   Brunetta Genera, MD  triamcinolone lotion (KENALOG) 0.1 % Apply 1 application topically 3 (three) times daily. Patient not taking: No sig reported 09/26/20   Harle Stanford., PA-C  prochlorperazine (COMPAZINE) 10 MG tablet Take 1 tablet (10 mg total) by mouth every 6 (six) hours as needed (Nausea or vomiting). Patient not taking: No sig reported 08/28/20 02/13/21  Brunetta Genera, MD    Scheduled Meds: . buPROPion  150 mg Oral Daily  . Chlorhexidine Gluconate Cloth  6 each Topical Daily  . [START ON 02/24/2021] pantoprazole  40 mg Intravenous Q12H  . sodium chloride flush  10-40 mL Intracatheter Q12H   Continuous Infusions: . sodium chloride 75 mL/hr at 02/21/21 0314  . pantoprozole (PROTONIX) infusion 8 mg/hr (02/21/21 0621)   PRN Meds:.acetaminophen **OR** acetaminophen, morphine injection, ondansetron **OR** ondansetron (ZOFRAN) IV, senna-docusate, sodium chloride flush  Allergies as of 02/20/2021 - Review Complete  02/20/2021  Allergen Reaction Noted  . Bee venom Anaphylaxis 05/02/2014  . Penicillin g Anaphylaxis, Hives, and Swelling 05/02/2014    Family History  Problem Relation Age of Onset  . Heart disease Mother   . Atrial fibrillation Mother     Social History   Socioeconomic History  . Marital status: Married    Spouse name: Not on file  . Number of children: 2  . Years of education: Not on file  . Highest education level: Not on file  Occupational History  . Not on file  Tobacco Use  . Smoking status: Never  . Smokeless tobacco: Never  Vaping Use  . Vaping Use: Never used  Substance and Sexual Activity  . Alcohol use: Yes    Comment: 2 beers daily  . Drug use: Never  . Sexual activity: Not on file  Other Topics Concern  . Not on file  Social History Narrative  . Not on file   Social Determinants of Health   Financial Resource Strain: Not on file  Food Insecurity: Not  on file  Transportation Needs: Not on file  Physical Activity: Not on file  Stress: Not on file  Social Connections: Not on file  Intimate Partner Violence: Not on file    Review of Systems: Review of Systems  Constitutional:  Positive for malaise/fatigue. Negative for chills and fever.  HENT:  Negative for sore throat.   Eyes:  Negative for pain and redness.  Respiratory:  Positive for shortness of breath. Negative for cough and stridor.   Cardiovascular:  Negative for chest pain and palpitations.  Gastrointestinal:  Positive for constipation, melena, nausea and vomiting. Negative for abdominal pain, blood in stool, diarrhea and heartburn.  Genitourinary:  Negative for flank pain and hematuria.  Musculoskeletal:  Negative for falls and joint pain.  Skin:  Negative for itching and rash.  Neurological:  Negative for seizures and loss of consciousness.  Endo/Heme/Allergies:  Negative for polydipsia. Does not bruise/bleed easily.  Psychiatric/Behavioral:  Negative for substance abuse. The patient is  not nervous/anxious.    Physical Exam: Vital signs: Vitals:   02/21/21 0313 02/21/21 0736  BP: (!) 143/71 (!) 145/72  Pulse: 74 76  Resp: 16 18  Temp: 98.9 F (37.2 C) 98.3 F (36.8 C)  SpO2: 97% 95%   Last BM Date: 02/20/21 Physical Exam Vitals reviewed.  Constitutional:      General: He is not in acute distress. HENT:     Head: Normocephalic and atraumatic.     Nose: Nose normal. No congestion.     Mouth/Throat:     Mouth: Mucous membranes are moist.     Pharynx: Oropharynx is clear.  Eyes:     Extraocular Movements: Extraocular movements intact.     Comments: Conjunctival pallor  Cardiovascular:     Rate and Rhythm: Normal rate and regular rhythm.  Abdominal:     General: Bowel sounds are normal. There is no distension.     Palpations: Abdomen is soft.     Tenderness: There is no abdominal tenderness. There is no guarding or rebound.     Hernia: No hernia is present.  Musculoskeletal:        General: No swelling or tenderness.     Cervical back: Normal range of motion and neck supple.  Skin:    General: Skin is warm and dry.  Neurological:     General: No focal deficit present.     Mental Status: He is alert and oriented to person, place, and time.  Psychiatric:        Mood and Affect: Mood normal.        Behavior: Behavior normal. Behavior is cooperative.    GI:  Lab Results: Recent Labs    02/20/21 1525 02/21/21 0520  WBC 6.9 7.0  HGB 5.4* 8.5*  HCT 16.0* 24.5*  PLT 145* 109*   BMET Recent Labs    02/20/21 1525 02/21/21 0520  NA 135 134*  K 3.8 3.6  CL 101 102  CO2 26 23  GLUCOSE 109* 97  BUN 31* 28*  CREATININE 1.33* 1.54*  CALCIUM 8.2* 8.1*   LFT Recent Labs    02/21/21 0520  PROT 5.5*  ALBUMIN 2.7*  AST 37  ALT 11  ALKPHOS 235*  BILITOT 0.8   PT/INR Recent Labs    02/20/21 1525  LABPROT 16.1*  INR 1.3*     Studies/Results: No results found.  Impression: Upper GI bleeding, presumably due to GEJ  adenocarcinoma. -Hgb 5.4 on arrival, now 8.5 after 3u pRBCs -BUN 28 with Cr  1.54   Plan: Repeat EGD today.  Discussed medical management versus repeat EGD with the patient.  Patient prefers to proceed with repeat EGD for further evaluation and possible identification of a source of bleeding.  Discussed that EGD may not allow for identification of source or treatment of bleeding, and patient understands this.  I thoroughly discussed the procedure with the patient to include nature, alternatives (medical management/PPI), benefits, and risks (including but not limited to bleeding, infection, perforation, anesthesia/cardiac and pulmonary complications).  Patient verbalized understanding and gave verbal consent to proceed with EGD.  Continue Protonix IV drip.  Continue to monitor H&H with transfusion as needed to maintain hemoglobin greater than 7.  [facility] will follow.   LOS: 0 days   Salley Slaughter  PA-C 02/21/2021, 8:37 AM  Contact #  [phone]

## 2021-02-21 NOTE — Progress Notes (Signed)
PROGRESS NOTE    Donald Wade  IDP:[id number] DOB: Jun 15, 1956 DOA: 02/20/2021 PCP: Berkley Harvey, NP    Brief Narrative:  Donald Wade is a 65 y.o. male past medical history of gastroesophageal junction adenocarcinoma, endoscopy 12/14 2021 revealed likely malignant esophageal tumor was found in the lower third of the esophagus and at the esophageal junction.He presents with weakness, and with episode of dark tarry stool  Hb 5.4  6/15 he is status post 3 units packed red blood cell  Consultants:  GI  Procedures: EGD  Antimicrobials:      Subjective: Reports nausea vomiting postprocedure.  No abdominal pain.  Objective: Vitals:   02/21/21 1127 02/21/21 1130 02/21/21 1140 02/21/21 1406  BP: (!) 147/68 (!) 155/64 (!) 166/59 (!) 141/70  Pulse:  80 76 68  Resp: 16 18 19 18   Temp:  97.9 F (36.6 C)  (!) 97.3 F (36.3 C)  TempSrc:  Oral  Oral  SpO2: 97% 97% 96% 95%    Intake/Output Summary (Last 24 hours) at 02/21/2021 1701 Last data filed at 02/21/2021 1421 Gross per 24 hour  Intake 1982.15 ml  Output 1100 ml  Net 882.15 ml   There were no vitals filed for this visit.  Examination:  General exam: Appears calm and comfortable  Respiratory system: Clear to auscultation. Respiratory effort normal. Cardiovascular system: S1 & S2 heard, RRR. No JVD, murmurs, rubs, gallops or clicks.  Gastrointestinal system: Abdomen is nondistended, soft and nontender. . Normal bowel sounds heard. Central nervous system: Alert and oriented.  Grossly intact extremities: No edema Skin: Warm dry Psychiatry:  Mood & affect appropriate.     Data Reviewed: I have personally reviewed following labs and imaging studies  CBC: Recent Labs  Lab 02/20/21 1525 02/21/21 0520 02/21/21 1425  WBC 6.9 7.0  --   NEUTROABS 5.1  --   --   HGB 5.4* 8.5* 8.5*  HCT 16.0* 24.5* 24.8*  MCV 97.0 90.7  --   PLT 145* 109*  --    Basic Metabolic Panel: Recent Labs  Lab 02/20/21 1525  02/21/21 0520  NA 135 134*  K 3.8 3.6  CL 101 102  CO2 26 23  GLUCOSE 109* 97  BUN 31* 28*  CREATININE 1.33* 1.54*  CALCIUM 8.2* 8.1*   GFR: CrCl cannot be calculated (Unknown ideal weight.). Liver Function Tests: Recent Labs  Lab 02/20/21 1525 02/21/21 0520  AST 42* 37  ALT 11 11  ALKPHOS 240* 235*  BILITOT 0.3 0.8  PROT 6.0* 5.5*  ALBUMIN 2.9* 2.7*   No results for input(s): LIPASE, AMYLASE in the last 168 hours. No results for input(s): AMMONIA in the last 168 hours. Coagulation Profile: Recent Labs  Lab 02/20/21 1525  INR 1.3*   Cardiac Enzymes: No results for input(s): CKTOTAL, CKMB, CKMBINDEX, TROPONINI in the last 168 hours. BNP (last 3 results) No results for input(s): PROBNP in the last 8760 hours. HbA1C: No results for input(s): HGBA1C in the last 72 hours. CBG: No results for input(s): GLUCAP in the last 168 hours. Lipid Profile: No results for input(s): CHOL, HDL, LDLCALC, TRIG, CHOLHDL, LDLDIRECT in the last 72 hours. Thyroid Function Tests: No results for input(s): TSH, T4TOTAL, FREET4, T3FREE, THYROIDAB in the last 72 hours. Anemia Panel: No results for input(s): VITAMINB12, FOLATE, FERRITIN, TIBC, IRON, RETICCTPCT in the last 72 hours. Sepsis Labs: No results for input(s): PROCALCITON, LATICACIDVEN in the last 168 hours.  Recent Results (from the past 240 hour(s))  SARS CORONAVIRUS  2 (TAT 6-24 HRS) Nasopharyngeal Nasopharyngeal Swab     Status: None   Collection Time: 02/20/21  3:47 PM   Specimen: Nasopharyngeal Swab  Result Value Ref Range Status   SARS Coronavirus 2 NEGATIVE NEGATIVE Final    Comment: (NOTE) SARS-CoV-2 target nucleic acids are NOT DETECTED.  The SARS-CoV-2 RNA is generally detectable in upper and lower respiratory specimens during the acute phase of infection. Negative results do not preclude SARS-CoV-2 infection, do not rule out co-infections with other pathogens, and should not be used as the sole basis for treatment  or other patient management decisions. Negative results must be combined with clinical observations, patient history, and epidemiological information. The expected result is Negative.  Fact Sheet for Patients: SugarRoll.be  Fact Sheet for Healthcare Providers: https://www.woods-mathews.com/  This test is not yet approved or cleared by the [place] FDA and  has been authorized for detection and/or diagnosis of SARS-CoV-2 by FDA under an Emergency Use Authorization (EUA). This EUA will remain  in effect (meaning this test can be used) for the duration of the COVID-19 declaration under Se ction 564(b)(1) of the Act, 21 U.S.C. section 360bbb-3(b)(1), unless the authorization is terminated or revoked sooner.  Performed at [facility], [place] [address], [place], [place] [address]          Radiology Studies: No results found.      Scheduled Meds:  buPROPion  150 mg Oral Daily   Chlorhexidine Gluconate Cloth  6 each Topical Daily   pantoprazole  40 mg Oral BID AC   sodium chloride flush  10-40 mL Intracatheter Q12H   sucralfate  1 g Oral TID WC & HS   Continuous Infusions:  sodium chloride 75 mL/hr at 02/21/21 1215    Assessment & Plan:   Active Problems:   GI bleed   1-GI bleed, acute blood loss anemia; likely secondary to GE junction cancer; S/p transfusion Status post EGD today please see report GI recommends PPI 40 mg twice daily indefinitely Carafate 1 g 4 times a day likely Patient will likely have Gastric cancer which is not abnormal to endoscopic therapy we will monitor H&H EGD showed Diffuse oozing from underlying gastric cancer, I injected  epinephrine but will have ongoing bleeding from gastric cancer in the future F/u with oncology   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   Per Dr Irene Limbo patent is not candidate for Sx do metastasis to bone and lymph nodes. Patient  suppose to [facility] for second opinion and potential clinical trial. He is suppose to follow up with Dr Irene Limbo for more chemo. Patient during last clinic office decline palliative care. -6/15- palliative care consult    HTN He is not on metoprolol holding BP meds in setting of GI bleed  Depression;  Continue Wellbutrin   AKI; in setting of hypoperfusion. Monitor. Blood transfusion.   DVT prophylaxis: SCD Code Status: Partial Family Communication: None at bedside Disposition Plan:  Status is: Inpatient  Remains inpatient appropriate because:Inpatient level of care appropriate due to severity of illness  Dispo: The patient is from: Home              Anticipated d/c is to: Home              Patient currently is not medically stable to d/c.   Difficult to place patient No            LOS: 0 days   Time  spent: 35 minutes with more than 50% on COC    Nolberto Hanlon, MD [facility] Pager [phone]  If 7PM-7AM, please contact night-coverage 02/21/2021, 5:01 PM

## 2021-02-22 ENCOUNTER — Encounter (HOSPITAL_COMMUNITY): Payer: Self-pay | Admitting: Gastroenterology

## 2021-02-22 DIAGNOSIS — N179 Acute kidney failure, unspecified: Secondary | ICD-10-CM | POA: Diagnosis not present

## 2021-02-22 DIAGNOSIS — I10 Essential (primary) hypertension: Secondary | ICD-10-CM | POA: Diagnosis not present

## 2021-02-22 DIAGNOSIS — K922 Gastrointestinal hemorrhage, unspecified: Secondary | ICD-10-CM | POA: Diagnosis not present

## 2021-02-22 LAB — TYPE AND SCREEN
ABO/RH(D): O POS
Antibody Screen: NEGATIVE
Unit division: 0
Unit division: 0
Unit division: 0

## 2021-02-22 LAB — CBC
HCT: 23.1 % — ABNORMAL LOW (ref 39.0–52.0)
Hemoglobin: 7.9 g/dL — ABNORMAL LOW (ref 13.0–17.0)
MCH: 31.6 pg (ref 26.0–34.0)
MCHC: 34.2 g/dL (ref 30.0–36.0)
MCV: 92.4 fL (ref 80.0–100.0)
Platelets: 93 10*3/uL — ABNORMAL LOW (ref 150–400)
RBC: 2.5 MIL/uL — ABNORMAL LOW (ref 4.22–5.81)
RDW: 15.2 % (ref 11.5–15.5)
WBC: 6.2 10*3/uL (ref 4.0–10.5)
nRBC: 0 % (ref 0.0–0.2)

## 2021-02-22 LAB — BPAM RBC
Blood Product Expiration Date: 202207162359
Blood Product Expiration Date: 202207162359
Blood Product Expiration Date: 202207162359
ISSUE DATE / TIME: 202206141757
ISSUE DATE / TIME: 202206142133
ISSUE DATE / TIME: 202206150026
Unit Type and Rh: 5100
Unit Type and Rh: 5100
Unit Type and Rh: 5100

## 2021-02-22 LAB — BASIC METABOLIC PANEL
Anion gap: 8 (ref 5–15)
BUN: 26 mg/dL — ABNORMAL HIGH (ref 8–23)
CO2: 24 mmol/L (ref 22–32)
Calcium: 7.7 mg/dL — ABNORMAL LOW (ref 8.9–10.3)
Chloride: 103 mmol/L (ref 98–111)
Creatinine, Ser: 1.33 mg/dL — ABNORMAL HIGH (ref 0.61–1.24)
GFR, Estimated: >60 mL/min (ref >60)
Glucose, Bld: 95 mg/dL (ref 70–99)
Potassium: 3.5 mmol/L (ref 3.5–5.1)
Sodium: 135 mmol/L (ref 135–145)

## 2021-02-22 MED ORDER — HEPARIN SOD (PORK) LOCK FLUSH 100 UNIT/ML IV SOLN
500.0000 [IU] | INTRAVENOUS | Status: AC | PRN
Start: 2021-02-22 — End: 2021-02-22
  Administered 2021-02-22: 500 [IU]
  Filled 2021-02-22: qty 5

## 2021-02-22 MED ORDER — PANTOPRAZOLE SODIUM 40 MG PO TBEC: 40.0000 mg | DELAYED_RELEASE_TABLET | Freq: Two times a day (BID) | ORAL | 0 refills | Status: DC

## 2021-02-22 MED ORDER — SUCRALFATE 1 G PO TABS: 1.0000 g | ORAL_TABLET | Freq: Three times a day (TID) | ORAL | 0 refills | Status: DC

## 2021-02-22 NOTE — Discharge Summary (Addendum)
Donald Wade CBS:[id number] DOB: Oct 25, 1955 DOA: 02/20/2021  PCP: Berkley Harvey, NP  Admit date: 02/20/2021 Discharge date: 02/22/2021  Admitted From: Home Disposition: Home  Recommendations for Outpatient Follow-up:  Follow up with PCP in 1 week Please obtain BMP/CBC in one week Oncologist next week     Discharge Condition:Stable CODE STATUS: Partial Diet recommendation: Regular    Brief/Interim Summary: Per WGY:KZLDJT S Donald Wade is a 65 y.o. male past medical history of gastroesophageal junction adenocarcinoma, endoscopy 12/14 2021 revealed likely malignant esophageal tumor was found in the lower third of the esophagus and at the esophageal junction.He presents with weakness, and with episode of dark tarry stool.  Was found with hemoglobin 5.4, status post 3 units packed red blood cell.  He went for endoscopy as GI was consulted.  His H&H was followed and remained stable.  1-GI bleed, acute blood loss anemia; likely secondary to GE junction cancer; S/p transfusion 3 units packed red blood cell Status post EGD today please see full report below GI recommends PPI 40 mg twice daily indefinitely Carafate 1 g 4 times a day likely EGD showed Diffuse oozing from underlying gastric cancer, I injected  epinephrine but will have ongoing bleeding from gastric cancer in the future H&H stable this AM.  No further reports of bloody stool. Patient will need to follow-up with oncology next week. 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   Per Dr Irene Limbo Donald Wade is not candidate for Sx do metastasis to bone and lymph nodes. Patient suppose to follow up at [facility] for second opinion and potential clinical trial. He is suppose to follow up with Dr Irene Limbo for more chemo. Patient during last clinic office declined palliative care. Palliative care was consulted here.  Wife states they are not sure if they want to see palliative care yet and will  follow-up with oncologist to discuss this further     HTN Continue home dose metoprolol   Depression;  Continue Wellbutrin   AKI; in setting of hypoperfusion. Improving. Encouraged po intake      Discharge Diagnoses:  Active Problems:   GI bleed    Discharge Instructions  Discharge Instructions     Call MD for:  persistant dizziness or light-headedness   Complete by: As directed    Call MD for:  persistant nausea and vomiting   Complete by: As directed    Diet - low sodium heart healthy   Complete by: As directed    Discharge instructions   Complete by: As directed    F/u with pcp in one week F/u with oncology next week Need blood work next week If you notice blood in stool return to hospital   Increase activity slowly   Complete by: As directed       Allergies as of 02/22/2021       Reactions   Bee Venom Anaphylaxis   Penicillin G Anaphylaxis, Hives, Swelling   Did it involve swelling of the face/tongue/throat, SOB, or low BP? Yes Did it involve sudden or severe rash/hives, skin peeling, or any reaction on the inside of your mouth or nose? Yes Did you need to seek medical attention at a hospital or doctor's office? Yes When did it last happen?      1990 If all above answers are "NO", may proceed with cephalosporin use.        Medication List     STOP taking these medications  doxycycline 100 MG capsule Commonly known as: VIBRAMYCIN   famotidine 20 MG tablet Commonly known as: PEPCID   magic mouthwash w/lidocaine Soln   polyethylene glycol 17 g packet Commonly known as: MIRALAX / GLYCOLAX   predniSONE 5 MG tablet Commonly known as: DELTASONE   sucralfate 1 GM/10ML suspension Commonly known as: Carafate Replaced by: sucralfate 1 g tablet   triamcinolone lotion 0.1 % Commonly known as: KENALOG       TAKE these medications    acetaminophen 325 MG tablet Commonly known as: TYLENOL Take 325-650 mg by mouth every 6 (six) hours as  needed for mild pain.   bisacodyl 5 MG EC tablet Commonly known as: DULCOLAX Take 10 mg by mouth daily as needed for moderate constipation.   buPROPion 150 MG 24 hr tablet Commonly known as: WELLBUTRIN XL Take 150 mg by mouth daily.   CLEAR EYES OP Place 1 drop into both eyes daily as needed (irritation / allergies).   EPINEPHrine 0.3 mg/0.3 mL Soaj injection Commonly known as: EPI-PEN Inject 0.3 mg into the skin as needed for anaphylaxis (call 911).   feeding supplement Liqd Take 237 mLs by mouth 2 (two) times daily between meals.   fexofenadine 180 MG tablet Commonly known as: ALLEGRA Take 180 mg by mouth daily as needed for allergies.   metoprolol succinate 50 MG 24 hr tablet Commonly known as: Toprol XL Take 1 tablet (50 mg total) by mouth daily.   pantoprazole 40 MG tablet Commonly known as: PROTONIX Take 1 tablet (40 mg total) by mouth 2 (two) times daily before a meal. What changed: when to take this   sucralfate 1 g tablet Commonly known as: CARAFATE Take 1 tablet (1 g total) by mouth 4 (four) times daily -  with meals and at bedtime. Replaces: sucralfate 1 GM/10ML suspension   vitamin B-12 500 MCG tablet Commonly known as: CYANOCOBALAMIN Take 500 mcg by mouth daily.        Follow-up Information     Berkley Harvey, NP Follow up in 3 day(s).   Specialty: Nurse Practitioner Why: needs cbc Contact information: [place] [address] [phone]         Brunetta Genera, MD Follow up.   Specialties: Hematology, Oncology Contact information: [place] [address] [phone]                Allergies  Allergen Reactions   Bee Venom Anaphylaxis   Penicillin G Anaphylaxis, Hives and Swelling    Did it involve swelling of the face/tongue/throat, SOB, or low BP? Yes Did it involve sudden or severe rash/hives, skin peeling, or any reaction on the inside of your mouth or nose? Yes Did  you need to seek medical attention at a hospital or doctor's office? Yes When did it last happen?      1990 If all above answers are "NO", may proceed with cephalosporin use.    Consultations: GI   Procedures/Studies: No results found.    Subjective: Denies shortness of breath, dizziness, or lightheadedness wife at bedside.  Discharge Exam: Vitals:   02/21/21 1958 02/22/21 0456  BP: (!) 141/70 (!) 143/75  Pulse: 74 77  Resp: 18 18  Temp: 98 F (36.7 C) 98.1 F (36.7 C)  SpO2: 96% 96%   Vitals:   02/21/21 1140 02/21/21 1406 02/21/21 1958 02/22/21 0456  BP: (!) 166/59 (!) 141/70 (!) 141/70 (!) 143/75  Pulse: 76 68 74 77  Resp: 19 18 18 18   Temp:  (!) 97.3 F (36.3 C) 98 F (36.7 C) 98.1 F (36.7 C)  TempSrc:  Oral  Oral  SpO2: 96% 95% 96% 96%    General: Pt is alert, awake, not in acute distress Cardiovascular: RRR, S1/S2 +, no rubs, no gallops Respiratory: CTA bilaterally, no wheezing, no rhonchi Abdominal: Soft, NT, ND, bowel sounds + Extremities: no edema    The results of significant diagnostics from this hospitalization (including imaging, microbiology, ancillary and laboratory) are listed below for reference.     Microbiology: Recent Results (from the past 240 hour(s))  SARS CORONAVIRUS 2 (TAT 6-24 HRS) Nasopharyngeal Nasopharyngeal Swab     Status: None   Collection Time: 02/20/21  3:47 PM   Specimen: Nasopharyngeal Swab  Result Value Ref Range Status   SARS Coronavirus 2 NEGATIVE NEGATIVE Final    Comment: (NOTE) SARS-CoV-2 target nucleic acids are NOT DETECTED.  The SARS-CoV-2 RNA is generally detectable in upper and lower respiratory specimens during the acute phase of infection. Negative results do not preclude SARS-CoV-2 infection, do not rule out co-infections with other pathogens, and should not be used as the sole basis for treatment or other patient management decisions. Negative results must be combined with clinical  observations, patient history, and epidemiological information. The expected result is Negative.  Fact Sheet for Patients: SugarRoll.be  Fact Sheet for Healthcare Providers: https://www.woods-mathews.com/  This test is not yet approved or cleared by the [place] FDA and  has been authorized for detection and/or diagnosis of SARS-CoV-2 by FDA under an Emergency Use Authorization (EUA). This EUA will remain  in effect (meaning this test can be used) for the duration of the COVID-19 declaration under Se ction 564(b)(1) of the Act, 21 U.S.C. section 360bbb-3(b)(1), unless the authorization is terminated or revoked sooner.  Performed at [facility], [place] [address], [place], [place] [address]      Labs: BNP (last 3 results) No results for input(s): BNP in the last 8760 hours. Basic Metabolic Panel: Recent Labs  Lab 02/20/21 1525 02/21/21 0520 02/22/21 0425  NA 135 134* 135  K 3.8 3.6 3.5  CL 101 102 103  CO2 26 23 24   GLUCOSE 109* 97 95  BUN 31* 28* 26*  CREATININE 1.33* 1.54* 1.33*  CALCIUM 8.2* 8.1* 7.7*   Liver Function Tests: Recent Labs  Lab 02/20/21 1525 02/21/21 0520  AST 42* 37  ALT 11 11  ALKPHOS 240* 235*  BILITOT 0.3 0.8  PROT 6.0* 5.5*  ALBUMIN 2.9* 2.7*   No results for input(s): LIPASE, AMYLASE in the last 168 hours. No results for input(s): AMMONIA in the last 168 hours. CBC: Recent Labs  Lab 02/20/21 1525 02/21/21 0520 02/21/21 1425 02/21/21 2302 02/22/21 0425  WBC 6.9 7.0  --   --  6.2  NEUTROABS 5.1  --   --   --   --   HGB 5.4* 8.5* 8.5* 7.9* 7.9*  HCT 16.0* 24.5* 24.8* 23.3* 23.1*  MCV 97.0 90.7  --   --  92.4  PLT 145* 109*  --   --  93*   Cardiac Enzymes: No results for input(s): CKTOTAL, CKMB, CKMBINDEX, TROPONINI in the last 168 hours. BNP: Invalid input(s): POCBNP CBG: No results for input(s): GLUCAP in the last 168 hours. D-Dimer No results for input(s): DDIMER in the  last 72 hours. Hgb A1c No results for input(s): HGBA1C in the last 72 hours. Lipid Profile No results for input(s): CHOL,  HDL, LDLCALC, TRIG, CHOLHDL, LDLDIRECT in the last 72 hours. Thyroid function studies No results for input(s): TSH, T4TOTAL, T3FREE, THYROIDAB in the last 72 hours.  Invalid input(s): FREET3 Anemia work up No results for input(s): VITAMINB12, FOLATE, FERRITIN, TIBC, IRON, RETICCTPCT in the last 72 hours. Urinalysis No results found for: COLORURINE, APPEARANCEUR, [place], [place], [place], [place], [place], [place], PROTEINUR, UROBILINOGEN, NITRITE, LEUKOCYTESUR Sepsis Labs Invalid input(s): PROCALCITONIN,  WBC,  LACTICIDVEN Microbiology Recent Results (from the past 240 hour(s))  SARS CORONAVIRUS 2 (TAT 6-24 HRS) Nasopharyngeal Nasopharyngeal Swab     Status: None   Collection Time: 02/20/21  3:47 PM   Specimen: Nasopharyngeal Swab  Result Value Ref Range Status   SARS Coronavirus 2 NEGATIVE NEGATIVE Final    Comment: (NOTE) SARS-CoV-2 target nucleic acids are NOT DETECTED.  The SARS-CoV-2 RNA is generally detectable in upper and lower respiratory specimens during the acute phase of infection. Negative results do not preclude SARS-CoV-2 infection, do not rule out co-infections with other pathogens, and should not be used as the sole basis for treatment or other patient management decisions. Negative results must be combined with clinical observations, patient history, and epidemiological information. The expected result is Negative.  Fact Sheet for Patients: SugarRoll.be  Fact Sheet for Healthcare Providers: https://www.woods-mathews.com/  This test is not yet approved or cleared by the [place] FDA and  has been authorized for detection and/or diagnosis of SARS-CoV-2 by FDA under an Emergency Use Authorization (EUA). This EUA will remain  in effect (meaning this test can be used) for the duration of  the COVID-19 declaration under Se ction 564(b)(1) of the Act, 21 U.S.C. section 360bbb-3(b)(1), unless the authorization is terminated or revoked sooner.  Performed at [facility], [place] [address], [place], [place] [address]      Time coordinating discharge: Over 30 minutes  SIGNED:   Nolberto Hanlon, MD  [facility] 02/22/2021, 3:13 PM Pager   If 7PM-7AM, please contact night-coverage [email or web address] Password [email or web address]

## 2021-02-22 NOTE — Plan of Care (Signed)

## 2021-02-22 NOTE — Progress Notes (Signed)
[facility] Gastroenterology Progress Note  Donald Wade 65 y.o. 04/14/1956  CC:  Upper GI bleeding due to gastric adenocarcinoma   Subjective: Patient had some post-procedural nausea/vomiting and abdominal pain, which has resolved. He tolerated breakfast (sausage, part of french toast, coffee). Has not had a BM but does not feel constipated. Noted one small smear of black stool.  ROS : Review of Systems  Cardiovascular:  Negative for chest pain and palpitations.  Gastrointestinal:  Negative for abdominal pain, blood in stool, constipation, diarrhea, heartburn, melena, nausea and vomiting.     Objective: Vital signs in last 24 hours: Vitals:   02/21/21 1958 02/22/21 0456  BP: (!) 141/70 (!) 143/75  Pulse: 74 77  Resp: 18 18  Temp: 98 F (36.7 C) 98.1 F (36.7 C)  SpO2: 96% 96%    Physical Exam:  General:  Alert, cooperative, no distress, sitting in chair comfortably  Head:  Normocephalic, without obvious abnormality, atraumatic  Eyes:  Conjunctival pallor, EOMs intact   Lungs:   Clear to auscultation bilaterally, respirations unlabored  Heart:  Regular rate and rhythm, S1, S2 normal  Abdomen:   Soft, non-tender, non-distended, bowel sounds active all four quadrants  Extremities: Extremities normal, atraumatic, no  edema    Lab Results: Recent Labs    02/21/21 0520 02/22/21 0425  NA 134* 135  K 3.6 3.5  CL 102 103  CO2 23 24  GLUCOSE 97 95  BUN 28* 26*  CREATININE 1.54* 1.33*  CALCIUM 8.1* 7.7*   Recent Labs    02/20/21 1525 02/21/21 0520  AST 42* 37  ALT 11 11  ALKPHOS 240* 235*  BILITOT 0.3 0.8  PROT 6.0* 5.5*  ALBUMIN 2.9* 2.7*   Recent Labs    02/20/21 1525 02/21/21 0520 02/21/21 1425 02/21/21 2302 02/22/21 0425  WBC 6.9 7.0  --   --  6.2  NEUTROABS 5.1  --   --   --   --   HGB 5.4* 8.5*   < > 7.9* 7.9*  HCT 16.0* 24.5*   < > 23.3* 23.1*  MCV 97.0 90.7  --   --  92.4  PLT 145* 109*  --   --  93*   < > = values in this interval not  displayed.   Recent Labs    02/20/21 1525  LABPROT 16.1*  INR 1.3*      Assessment: Upper GI bleeding, presumably due to GEJ adenocarcinoma. -EGD 02/21/21: Malignant esophageal tumor was found at the gastroesophageal junction. Nodular and ulcerated mucosa in the cardia, gastric fundus, greater curvature, lesser curvature, antrum and pylorus compatible with known gastric cancer. Injected. Patient will likely have more bleeding from underlying gastric cancer which is not amenable to endoscopic therapy. -Hgb 7.9, slightly decreased from post-transfusion Hgb of 8.5 -BUN 26 with Cr 1.33   Plan: Continue Protonix BID indefinitely.  Continue Carafate 4 times daily indefinitely.  Further management as per oncology.  Repeat CBC in 1-2 weeks. Patient advised to return to the ED if worsening signs of anemia (weakness, chest pain, shortness of breath, etc) or signs of ongoing/active GI bleeding. He verbalized understanding.  [facility] will sign off. Please contact us if we can be of any further assistance during this hospital stay.   Salley Slaughter PA-C 02/22/2021, 9:30 AM  Contact #  [phone]

## 2021-02-23 ENCOUNTER — Telehealth: Payer: Self-pay

## 2021-02-23 ENCOUNTER — Telehealth: Payer: Self-pay | Admitting: Hematology

## 2021-02-23 MED ORDER — SUCRALFATE 1 G PO TABS: 1.0000 g | ORAL_TABLET | Freq: Three times a day (TID) | ORAL | 0 refills | Status: DC

## 2021-02-23 NOTE — Telephone Encounter (Signed)
Returned call to pt's wife per her request . Pt needed a 90 day supply of Carafate sent to pharmacy. Answered questions about upcoming appointments.

## 2021-02-23 NOTE — Telephone Encounter (Signed)
Scheduled follow-up appointments per 6/13 staff message. Patient is aware.

## 2021-02-25 ENCOUNTER — Encounter (HOSPITAL_COMMUNITY): Payer: Self-pay | Admitting: Emergency Medicine

## 2021-02-25 ENCOUNTER — Other Ambulatory Visit: Payer: Self-pay

## 2021-02-25 ENCOUNTER — Inpatient Hospital Stay (HOSPITAL_COMMUNITY)
Admission: EM | Admit: 2021-02-25 | Discharge: 2021-03-09 | DRG: 378 | Disposition: E | Payer: No Typology Code available for payment source | Attending: Family Medicine | Admitting: Family Medicine

## 2021-02-25 DIAGNOSIS — Z7189 Other specified counseling: Secondary | ICD-10-CM | POA: Diagnosis not present

## 2021-02-25 DIAGNOSIS — Z91030 Bee allergy status: Secondary | ICD-10-CM

## 2021-02-25 DIAGNOSIS — D649 Anemia, unspecified: Secondary | ICD-10-CM

## 2021-02-25 DIAGNOSIS — D630 Anemia in neoplastic disease: Secondary | ICD-10-CM | POA: Diagnosis present

## 2021-02-25 DIAGNOSIS — D62 Acute posthemorrhagic anemia: Secondary | ICD-10-CM | POA: Diagnosis present

## 2021-02-25 DIAGNOSIS — Z20822 Contact with and (suspected) exposure to covid-19: Secondary | ICD-10-CM | POA: Diagnosis present

## 2021-02-25 DIAGNOSIS — E782 Mixed hyperlipidemia: Secondary | ICD-10-CM | POA: Diagnosis present

## 2021-02-25 DIAGNOSIS — Z79899 Other long term (current) drug therapy: Secondary | ICD-10-CM | POA: Diagnosis not present

## 2021-02-25 DIAGNOSIS — C778 Secondary and unspecified malignant neoplasm of lymph nodes of multiple regions: Secondary | ICD-10-CM | POA: Diagnosis not present

## 2021-02-25 DIAGNOSIS — I10 Essential (primary) hypertension: Secondary | ICD-10-CM | POA: Diagnosis present

## 2021-02-25 DIAGNOSIS — C158 Malignant neoplasm of overlapping sites of esophagus: Secondary | ICD-10-CM | POA: Diagnosis present

## 2021-02-25 DIAGNOSIS — E871 Hypo-osmolality and hyponatremia: Secondary | ICD-10-CM | POA: Diagnosis present

## 2021-02-25 DIAGNOSIS — Z9221 Personal history of antineoplastic chemotherapy: Secondary | ICD-10-CM | POA: Diagnosis not present

## 2021-02-25 DIAGNOSIS — N179 Acute kidney failure, unspecified: Secondary | ICD-10-CM | POA: Diagnosis present

## 2021-02-25 DIAGNOSIS — C7951 Secondary malignant neoplasm of bone: Secondary | ICD-10-CM | POA: Diagnosis present

## 2021-02-25 DIAGNOSIS — D6959 Other secondary thrombocytopenia: Secondary | ICD-10-CM | POA: Diagnosis present

## 2021-02-25 DIAGNOSIS — Z515 Encounter for palliative care: Secondary | ICD-10-CM | POA: Diagnosis not present

## 2021-02-25 DIAGNOSIS — Z880 Allergy status to penicillin: Secondary | ICD-10-CM

## 2021-02-25 DIAGNOSIS — Z66 Do not resuscitate: Secondary | ICD-10-CM | POA: Diagnosis not present

## 2021-02-25 DIAGNOSIS — K922 Gastrointestinal hemorrhage, unspecified: Secondary | ICD-10-CM | POA: Diagnosis present

## 2021-02-25 DIAGNOSIS — K5900 Constipation, unspecified: Secondary | ICD-10-CM | POA: Diagnosis present

## 2021-02-25 DIAGNOSIS — K920 Hematemesis: Principal | ICD-10-CM | POA: Diagnosis present

## 2021-02-25 DIAGNOSIS — C779 Secondary and unspecified malignant neoplasm of lymph node, unspecified: Secondary | ICD-10-CM | POA: Diagnosis present

## 2021-02-25 DIAGNOSIS — F32A Depression, unspecified: Secondary | ICD-10-CM | POA: Diagnosis present

## 2021-02-25 DIAGNOSIS — Z923 Personal history of irradiation: Secondary | ICD-10-CM

## 2021-02-25 DIAGNOSIS — D696 Thrombocytopenia, unspecified: Secondary | ICD-10-CM | POA: Diagnosis not present

## 2021-02-25 DIAGNOSIS — C160 Malignant neoplasm of cardia: Secondary | ICD-10-CM | POA: Diagnosis not present

## 2021-02-25 DIAGNOSIS — Z8249 Family history of ischemic heart disease and other diseases of the circulatory system: Secondary | ICD-10-CM

## 2021-02-25 DIAGNOSIS — D500 Iron deficiency anemia secondary to blood loss (chronic): Secondary | ICD-10-CM | POA: Diagnosis not present

## 2021-02-25 DIAGNOSIS — F419 Anxiety disorder, unspecified: Secondary | ICD-10-CM | POA: Diagnosis present

## 2021-02-25 LAB — COMPREHENSIVE METABOLIC PANEL
ALT: 12 U/L (ref 0–44)
AST: 39 U/L (ref 15–41)
Albumin: 2.2 g/dL — ABNORMAL LOW (ref 3.5–5.0)
Alkaline Phosphatase: 239 U/L — ABNORMAL HIGH (ref 38–126)
Anion gap: 7 (ref 5–15)
BUN: 33 mg/dL — ABNORMAL HIGH (ref 8–23)
CO2: 23 mmol/L (ref 22–32)
Calcium: 7.4 mg/dL — ABNORMAL LOW (ref 8.9–10.3)
Chloride: 104 mmol/L (ref 98–111)
Creatinine, Ser: 1.31 mg/dL — ABNORMAL HIGH (ref 0.61–1.24)
GFR, Estimated: >60 mL/min (ref >60)
Glucose, Bld: 116 mg/dL — ABNORMAL HIGH (ref 70–99)
Potassium: 3.6 mmol/L (ref 3.5–5.1)
Sodium: 134 mmol/L — ABNORMAL LOW (ref 135–145)
Total Bilirubin: 0.9 mg/dL (ref 0.3–1.2)
Total Protein: 4.5 g/dL — ABNORMAL LOW (ref 6.5–8.1)

## 2021-02-25 LAB — CBC WITH DIFFERENTIAL/PLATELET
Band Neutrophils: 2 %
Basophils Relative: 0 %
Blasts: NONE SEEN %
Eosinophils Relative: 0 %
HCT: 15.9 % — ABNORMAL LOW (ref 39.0–52.0)
Hemoglobin: 5.4 g/dL — CL (ref 13.0–17.0)
Lymphocytes Relative: 2 %
MCH: 31.8 pg (ref 26.0–34.0)
MCHC: 34 g/dL (ref 30.0–36.0)
MCV: 93.5 fL (ref 80.0–100.0)
Metamyelocytes Relative: 3 %
Monocytes Relative: 1 %
Myelocytes: 1 %
Neutrophils Relative %: 91 %
Platelets: 86 10*3/uL — ABNORMAL LOW (ref 150–400)
Promyelocytes Relative: NONE SEEN %
RBC Morphology: NORMAL
RBC: 1.7 MIL/uL — ABNORMAL LOW (ref 4.22–5.81)
RDW: 15.3 % (ref 11.5–15.5)
WBC Morphology: NORMAL
WBC: 8.2 10*3/uL (ref 4.0–10.5)
nRBC: 0.2 % (ref 0.0–0.2)
nRBC: 1 /100 WBC — ABNORMAL HIGH

## 2021-02-25 LAB — PROTIME-INR
INR: 1.5 — ABNORMAL HIGH (ref 0.8–1.2)
Prothrombin Time: 18 seconds — ABNORMAL HIGH (ref 11.4–15.2)

## 2021-02-25 LAB — PREPARE RBC (CROSSMATCH)

## 2021-02-25 LAB — RESP PANEL BY RT-PCR (FLU A&B, COVID) ARPGX2
Influenza A by PCR: NEGATIVE
Influenza B by PCR: NEGATIVE
SARS Coronavirus 2 by RT PCR: NEGATIVE

## 2021-02-25 LAB — HEMOGLOBIN AND HEMATOCRIT, BLOOD
HCT: 22.1 % — ABNORMAL LOW (ref 39.0–52.0)
Hemoglobin: 7.5 g/dL — ABNORMAL LOW (ref 13.0–17.0)

## 2021-02-25 LAB — POC OCCULT BLOOD, ED: Fecal Occult Bld: POSITIVE — AB

## 2021-02-25 MED ORDER — PANTOPRAZOLE 80MG IVPB - SIMPLE MED
80.0000 mg | Freq: Once | INTRAVENOUS | Status: AC
  Administered 2021-02-25: 80 mg via INTRAVENOUS
  Filled 2021-02-25: qty 80

## 2021-02-25 MED ORDER — BUPROPION HCL ER (XL) 150 MG PO TB24
150.0000 mg | ORAL_TABLET | Freq: Every day | ORAL | Status: DC
  Administered 2021-02-25 – 2021-02-28 (×4): 150 mg via ORAL
  Filled 2021-02-25 (×5): qty 1

## 2021-02-25 MED ORDER — ONDANSETRON HCL 4 MG/2ML IJ SOLN
4.0000 mg | Freq: Three times a day (TID) | INTRAMUSCULAR | Status: DC | PRN
  Administered 2021-02-25: 4 mg via INTRAVENOUS
  Administered 2021-02-26 – 2021-02-27 (×3): 8 mg via INTRAVENOUS
  Administered 2021-02-28 (×2): 4 mg via INTRAVENOUS
  Administered 2021-02-28 – 2021-03-01 (×2): 8 mg via INTRAVENOUS
  Filled 2021-02-25: qty 2
  Filled 2021-02-25 (×3): qty 4
  Filled 2021-02-25: qty 2
  Filled 2021-02-25: qty 4
  Filled 2021-02-25: qty 2
  Filled 2021-02-25: qty 4

## 2021-02-25 MED ORDER — PANTOPRAZOLE SODIUM 40 MG IV SOLR
40.0000 mg | Freq: Two times a day (BID) | INTRAVENOUS | Status: DC
  Filled 2021-02-25: qty 40

## 2021-02-25 MED ORDER — SUCRALFATE 1 G PO TABS
1.0000 g | ORAL_TABLET | Freq: Three times a day (TID) | ORAL | Status: DC
  Administered 2021-02-25 – 2021-03-01 (×13): 1 g via ORAL
  Filled 2021-02-25 (×13): qty 1

## 2021-02-25 MED ORDER — ALPRAZOLAM 0.25 MG PO TABS
0.2500 mg | ORAL_TABLET | Freq: Two times a day (BID) | ORAL | Status: AC | PRN
Start: 2021-02-25 — End: 2021-02-26
  Administered 2021-02-25 – 2021-02-26 (×2): 0.25 mg via ORAL
  Filled 2021-02-25 (×2): qty 1

## 2021-02-25 MED ORDER — LORAZEPAM 0.5 MG PO TABS
0.5000 mg | ORAL_TABLET | Freq: Once | ORAL | Status: AC
  Administered 2021-02-25: 0.5 mg via ORAL
  Filled 2021-02-25: qty 1

## 2021-02-25 MED ORDER — SODIUM CHLORIDE 0.9% FLUSH
10.0000 mL | Freq: Two times a day (BID) | INTRAVENOUS | Status: DC
  Administered 2021-02-25 – 2021-03-02 (×8): 10 mL

## 2021-02-25 MED ORDER — CHLORHEXIDINE GLUCONATE CLOTH 2 % EX PADS
6.0000 | MEDICATED_PAD | Freq: Every day | CUTANEOUS | Status: DC
  Administered 2021-02-25 – 2021-02-28 (×3): 6 via TOPICAL

## 2021-02-25 MED ORDER — SODIUM CHLORIDE 0.9 % IV SOLN
10.0000 mL/h | Freq: Once | INTRAVENOUS | Status: AC
  Administered 2021-02-25: 10 mL/h via INTRAVENOUS

## 2021-02-25 MED ORDER — MAGNESIUM CITRATE PO SOLN
0.5000 | Freq: Once | ORAL | Status: AC | PRN
  Administered 2021-02-25: 0.5 via ORAL
  Filled 2021-02-25: qty 296

## 2021-02-25 MED ORDER — SODIUM CHLORIDE 0.9% IV SOLUTION: Freq: Once | INTRAVENOUS | Status: DC

## 2021-02-25 MED ORDER — MILK AND MOLASSES ENEMA: 1.0000 | Freq: Once | RECTAL | Status: DC | PRN

## 2021-02-25 MED ORDER — SODIUM CHLORIDE 0.9% FLUSH
10.0000 mL | INTRAVENOUS | Status: DC | PRN
  Administered 2021-03-01: 10 mL

## 2021-02-25 MED ORDER — PANTOPRAZOLE INFUSION (NEW) - SIMPLE MED
8.0000 mg/h | INTRAVENOUS | Status: AC
  Administered 2021-02-25 – 2021-02-27 (×5): 8 mg/h via INTRAVENOUS
  Filled 2021-02-25 (×3): qty 100
  Filled 2021-02-25: qty 80
  Filled 2021-02-25: qty 100
  Filled 2021-02-25: qty 80
  Filled 2021-02-25: qty 100
  Filled 2021-02-25 (×2): qty 80
  Filled 2021-02-25: qty 100
  Filled 2021-02-25: qty 80

## 2021-02-25 NOTE — H&P (Addendum)
History and Physical        Hospital Admission Note Date: 03/07/2021  Patient name: Donald Wade Medical record number: [id number] Date of birth: 1956/07/14 Age: 65 y.o. Gender: male  PCP: Berkley Harvey, NP    Patient coming from: Home   I have reviewed all records in the [place].    Chief Complaint:  Hematemesis   HPI: Donald Wade is a 65 y.o. male past medical history of gastroesophageal junction adenocarcinoma, endoscopy 12/14 2021 revealed likely malignant esophageal tumor was found in the lower third of the esophagus and at the esophageal junction.  Likely malignant gastric tumor in the cardia.  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  Since diagnosis patient has received 28 days of radiation and 4 several chemotherapy treatment. He was supposed to follow up with Oncology tomorrow for blood work and to discuss starting another chemotherapy regimen. He is established with Dr. Irene Limbo.   He was recently discharged 6/16 for hospital stay for acute GI bleed. During this stay he received PRBC and had EGD that showed oozing from Donald Wade cancer. He was discharged with carafate and PPI. Reports he has been taking these.   In the middle of the night he began having vomiting with bright red blood and coffee ground appearance. This occurred 3 times.   Donald Wade have teleconference with [facility] for second opinion on Wednesday. He and Donald Wade Wade are agreeable to palliative care. He reports that he realizes this is terminal. He would like to be more comfortable and is coming to terms for Donald Wade prognosis.    ED work-up/course:  535 am: case d/w Dr. Therisa Doyne. GI has nothing additional to add at this time. Octreotide will not help.  Protonix and carafate for management Confirmed with Lab  hemoglobin is 5.4 with platelets of 74K, they are rerunning  2uPRBC ordered.  Admit to medicine.   Review of Systems: Positives marked in 'bold' Constitutional: Denies fever, chills, diaphoresis, poor appetite and fatigue.  HEENT: Denies photophobia, eye pain, redness, hearing loss, ear pain, congestion, sore throat, rhinorrhea, sneezing, mouth sores, trouble swallowing, neck pain, neck stiffness and tinnitus.   Respiratory: Denies SOB, DOE, cough, chest tightness,  and wheezing.   Cardiovascular: Denies chest pain, palpitations and leg swelling.  Gastrointestinal: Denies nausea, vomiting, abdominal pain, diarrhea, constipation, blood in stool and abdominal distention.  Genitourinary: Denies dysuria, urgency, frequency, hematuria, flank pain and difficulty urinating.  Musculoskeletal: Denies myalgias, back pain, joint swelling, arthralgias and gait problem.  Skin: Denies pallor, rash and wound.  Neurological: Denies dizziness, seizures, syncope, weakness, light-headedness, numbness and headaches.  Hematological: Denies adenopathy. Easy bruising, personal or family bleeding history Psychiatric/Behavioral: Denies suicidal ideation, mood changes, confusion, nervousness, sleep disturbance and agitation  Past Medical History: Past Medical History:  Diagnosis Date   Anemia 08/22/2020   Cancer ([place])    Heart murmur    Hyperlipidemia    Hypertension    Port-A-Cath in place     Past Surgical History:  Procedure Laterality Date   APPENDECTOMY  1980   BIOPSY  08/22/2020  Procedure: BIOPSY;  Surgeon: Clarene Essex, MD;  Location: [facility] ENDOSCOPY;  Service: Endoscopy;;   BIOPSY  01/30/2021   Procedure: BIOPSY;  Surgeon: Clarene Essex, MD;  Location: [facility] ENDOSCOPY;  Service: Endoscopy;;   ESOPHAGOGASTRODUODENOSCOPY  08/22/2020   ESOPHAGOGASTRODUODENOSCOPY N/A 08/22/2020   Procedure: ESOPHAGOGASTRODUODENOSCOPY (EGD);  Surgeon: Clarene Essex, MD;  Location: [facility];  Service: Endoscopy;  Laterality:  N/A;   ESOPHAGOGASTRODUODENOSCOPY N/A 02/21/2021   Procedure: ESOPHAGOGASTRODUODENOSCOPY (EGD);  Surgeon: Ronnette Juniper, MD;  Location: [facility] ENDOS[facility];  Service: Gastroenterology;  Laterality: N/A;   ESOPHAGOGASTRODUODENOSCOPY (EGD) WITH PROPOFOL N/A 01/30/2021   Procedure: ESOPHAGOGASTRODUODENOSCOPY (EGD) WITH PROPOFOL;  Surgeon: Clarene Essex, MD;  Location: [facility] ENDOSCOPY;  Service: Endoscopy;  Laterality: N/A;   IR IMAGING GUIDED PORT INSERTION  09/07/2020   LEFT HEART CATH AND CORONARY ANGIOGRAPHY N/A 11/23/2019   Procedure: LEFT HEART CATH AND CORONARY ANGIOGRAPHY;  Surgeon: Nigel Mormon, MD;  Location: [facility] CV LAB;  Service: Cardiovascular;  Laterality: N/A;   SUBMUCOSAL INJECTION  02/21/2021   Procedure: SUBMUCOSAL INJECTION;  Surgeon: Ronnette Juniper, MD;  Location: [facility] ENDOSCOPY;  Service: Gastroenterology;;    Medications: Prior to Admission medications   Medication Sig Start Date End Date Taking? Authorizing Provider  acetaminophen (TYLENOL) 325 MG tablet Take 325-650 mg by mouth every 6 (six) hours as needed for mild pain.   Yes [provider]  buPROPion (WELLBUTRIN XL) 150 MG 24 hr tablet Take 150 mg by mouth daily.  12/17/16  Yes [provider]  EPINEPHrine 0.3 mg/0.3 mL IJ SOAJ injection Inject 0.3 mg into the skin as needed for anaphylaxis (call 911). 05/01/16  Yes [provider]  lactose free nutrition (BOOST) LIQD Take 237 mLs by mouth daily.   Yes [provider]  metoprolol succinate (TOPROL XL) 50 MG 24 hr tablet Take 1 tablet (50 mg total) by mouth daily. Patient taking differently: Take 25 mg by mouth daily. 03/06/20  Yes Patwardhan, Manish J, MD  ondansetron (ZOFRAN) 8 MG tablet Take 8 mg by mouth every 8 (eight) hours as needed for nausea or vomiting.   Yes [provider]  pantoprazole (PROTONIX) 40 MG tablet Take 1 tablet (40 mg total) by mouth 2 (two) times daily before a meal. 02/22/21 05/23/21 Yes Amery, Gwynneth Albright, MD   sucralfate (CARAFATE) 1 g tablet Take 1 tablet (1 g total) by mouth 4 (four) times daily -  with meals and at bedtime. 02/23/21 05/24/21 Yes Brunetta Genera, MD  prochlorperazine (COMPAZINE) 10 MG tablet Take 1 tablet (10 mg total) by mouth every 6 (six) hours as needed (Nausea or vomiting). Patient not taking: No sig reported 08/28/20 02/13/21  Brunetta Genera, MD    Allergies:   Allergies  Allergen Reactions   Bee Venom Anaphylaxis   Penicillin G Anaphylaxis, Hives and Swelling    Did it involve swelling of the face/tongue/throat, SOB, or low BP? Yes Did it involve sudden or severe rash/hives, skin peeling, or any reaction on the inside of your mouth or nose? Yes Did you need to seek medical attention at a hospital or doctor's office? Yes When did it last happen?      1990 If all above answers are "NO", may proceed with cephalosporin use.    Social History:  reports that he has never smoked. He has never used smokeless tobacco. He reports current alcohol use. He reports that he does not use drugs.  Family History: Family History  Problem Relation Age of Onset   Heart disease  Mother    Atrial fibrillation Mother     Physical Exam: Blood pressure (!) 149/79, pulse (!) 105, temperature (!) 97.5 F (36.4 C), temperature source Oral, resp. rate 18, SpO2 98 %. General: Alert, awake, oriented x3, in no acute distress. Eyes: pink conjunctiva,anicteric sclera, pupils equal and reactive to light and accomodation, HEENT: normocephalic, atraumatic, oropharynx clear Neck: supple, no masses or lymphadenopathy, no goiter, no bruits, no JVD CVS: Tachycardic rate and regular rhythm, without murmurs, rubs or gallops. No lower extremity edema Resp : Clear to auscultation bilaterally, no wheezing, rales or rhonchi. GI : Soft, nontender, nondistended, positive bowel sounds, no masses. No hepatomegaly. No hernia.  Musculoskeletal: No clubbing or cyanosis, positive pedal pulses. No  contracture. ROM intact  Neuro: Grossly intact, no focal neurological deficits, strength 5/5 upper and lower extremities bilaterally Psych: alert and oriented x 3, normal mood and affect Skin: no rashes or lesions, warm and dry   LABS on Admission: I have personally reviewed all the labs and imagings below    Basic Metabolic Panel: Recent Labs  Lab 02/22/21 0425 02/20/2021 0449  NA 135 134*  K 3.5 3.6  CL 103 104  CO2 24 23  GLUCOSE 95 116*  BUN 26* 33*  CREATININE 1.33* 1.31*  CALCIUM 7.7* 7.4*   Liver Function Tests: Recent Labs  Lab 02/21/21 0520 03/07/2021 0449  AST 37 39  ALT 11 12  ALKPHOS 235* 239*  BILITOT 0.8 0.9  PROT 5.5* 4.5*  ALBUMIN 2.7* 2.2*   No results for input(s): LIPASE, AMYLASE in the last 168 hours. No results for input(s): AMMONIA in the last 168 hours. CBC: Recent Labs  Lab 02/20/21 1525 02/21/21 0520 02/22/21 0425 02/28/2021 0449  WBC 6.9   < > 6.2 8.2  NEUTROABS 5.1  --   --   --   HGB 5.4*   < > 7.9* 5.4*  HCT 16.0*   < > 23.1* 15.9*  MCV 97.0   < > 92.4 93.5  PLT 145*   < > 93* 86*   < > = values in this interval not displayed.   Cardiac Enzymes: No results for input(s): CKTOTAL, CKMB, CKMBINDEX, TROPONINI in the last 168 hours. BNP: Invalid input(s): POCBNP CBG: No results for input(s): GLUCAP in the last 168 hours.  Radiological Exams on Admission:  No results found.   Assessment/Plan Active Problems:   Essential hypertension   Mixed hyperlipidemia   Acute blood loss anemia   GI bleed   Malignant neoplasm of overlapping sites of esophagus (HCC)   Iron deficiency anemia due to chronic blood loss   Hematemesis   GI bleed, acute blood loss anemia; likely secondary to GE junction cancer HgB 5.4. Plt 86. HgB was 7.9 at d/c on 6/16. INR 1.5. ED provider discussed with GI this AM who reports nothing else to be done from their standpoint. Mildly tachycardic and has had a few BPs on softer side but otherwise fairly  hemodynamically stable despite acute blood loss. Respiratory status is stable.  -admit to progressive tele with continuous pulse ox  -transfuse with 2u PRBC, repeat H&H 2h post transfusion -CBC tomorrow  -Protonix gtt started in ED, continue...then transition to PO  -Carafate 1 g 4 times a day likely -Zofran prn    GE adenocarcinoma  Per Dr Irene Limbo patent is not candidate for Sx due metastasis to bone and lymph nodes. Was supposed to have follow up tomorrow outpatient to discuss other chemo options. Has conference with [facility] on  Wed for second opinion.  -palliative care consulted after discussion with patient and Wade  Patient suppose to follow up at [facility] for second opinion and potential clinical trial. He is suppose to follow up with Dr Irene Limbo for more chemo. Patient during last clinic office declined palliative care. Palliative care was consulted here.  Wade states they are not sure if they want to see palliative care yet and will follow-up with oncologist to discuss this further -Oncology has been made aware of Donald Wade admission and will round on Donald Wade    HTN Has not been taking Metoprolol at home due to low BPs.  -hold for now, monitor to restart after volume resuscitation    Depression;  Continue Wellbutrin   AKI Cr 1.3. Baseline appears to be 0.9. In setting of hypoperfusion. -monitor after blood transfusion -encourage PO intake, give IVF as needed   DVT prophylaxis: SCDs due to active bleed   CODE STATUS: FULL   Consults called: Palliative Care, Oncology   Family Communication: Admission, patients condition and plan of care including tests being ordered have been discussed with the patient and patient's Wade who indicates understanding and agree with the plan and Code Status  Admission status: Inpatient   The medical decision making on this patient was of high complexity and the patient is at high risk for clinical deterioration, therefore this is a level 3 admission.  Severity of  Illness:      The appropriate patient status for this patient is INPATIENT. Inpatient status is judged to be reasonable and necessary in order to provide the required intensity of service to ensure the patient's safety. The patient's presenting symptoms, physical exam findings, and initial radiographic and laboratory data in the context of their chronic comorbidities is felt to place them at high risk for further clinical deterioration. Furthermore, it is not anticipated that the patient will be medically stable for discharge from the hospital within 2 midnights of admission. The following factors support the patient status of inpatient.   " The patient's presenting symptoms include hematemesis. " The worrisome physical exam findings include tachycardia. " The initial radiographic and laboratory data are worrisome because of FOBT+, HgB 5.4. " The chronic co-morbidities include GE adenocarcinoma, HTN, HLD.   * I certify that at the point of admission it is my clinical judgment that the patient will require inpatient hospital care spanning beyond 2 midnights from the point of admission due to high intensity of service, high risk for further deterioration and high frequency of surveillance required.*   Time Spent on Admission: 50 minutes     Melina Schools D.O.  [facility] 02/27/2021, 8:54 AM

## 2021-02-25 NOTE — Progress Notes (Signed)
[facility] progressive unit coverage note.  The nursing staff reported that the patient was requesting to have another dose of lorazepam or something else to help him relax.  He received lorazepam 0.5 mg p.o. earlier today.  The staff also reports he has been constipated and requested something to treat it.  Alprazolam 0.25 mg p.o. twice daily as needed x2 doses ordered for anxiety.  Magnesium citrate 150 mL and/or milk of molasses enema as needed for constipation.  Tennis Must, MD.

## 2021-02-25 NOTE — ED Triage Notes (Signed)
Pt arrives via EMS from home. HX throat and stomach cancer. Pt vomited dark blood an hour ago. Denies pain.

## 2021-02-25 NOTE — ED Provider Notes (Addendum)
[facility] DEPT Provider Note   CSN: [id number] Arrival date & time: 02/23/2021  0442     History Chief Complaint  Patient presents with   Hematemesis    Donald Wade is a 65 y.o. male.  The history is provided by the patient.  Emesis Severity:  Moderate Duration:  1 day Timing:  Intermittent Quality:  Bright red blood and coffee grounds Progression:  Unchanged Chronicity:  Recurrent Recent urination:  Normal Context: not post-tussive   Relieved by:  Nothing Associated symptoms: no arthralgias and no fever   Risk factors: no alcohol use   Risk factors comment:  Has known esophageal cancer Also reports melena and fatigue.      Past Medical History:  Diagnosis Date   Anemia 08/22/2020   Cancer ([place])    Heart murmur    Hyperlipidemia    Hypertension    Port-A-Cath in place     Patient Active Problem List   Diagnosis Date Noted   Melena    Goals of care, counseling/discussion 02/13/2021   Port-A-Cath in place 11/21/2020   Iron deficiency anemia due to chronic blood loss 08/29/2020   Malignant neoplasm of overlapping sites of esophagus ([place]) 08/28/2020   GI bleed 08/22/2020   Symptomatic anemia 08/21/2020   Acute blood loss anemia 08/21/2020   Hypertriglyceridemia 06/08/2020   Mixed hyperlipidemia 03/06/2020   Elevated coronary artery calcium score 11/12/2019   Family history of early CAD 10/13/2019   Exertional dyspnea 10/13/2019   Essential hypertension 10/13/2019   PAD (peripheral artery disease) ([place]) 10/13/2019    Past Surgical History:  Procedure Laterality Date   APPENDECTOMY  1980   BIOPSY  08/22/2020   Procedure: BIOPSY;  Surgeon: Clarene Essex, MD;  Location: [facility];  Service: Endoscopy;;   BIOPSY  01/30/2021   Procedure: BIOPSY;  Surgeon: Clarene Essex, MD;  Location: [facility] ENDOSCOPY;  Service: Endoscopy;;   ESOPHAGOGASTRODUODENOSCOPY  08/22/2020   ESOPHAGOGASTRODUODENOSCOPY N/A 08/22/2020   Procedure:  ESOPHAGOGASTRODUODENOSCOPY (EGD);  Surgeon: Clarene Essex, MD;  Location: [facility];  Service: Endoscopy;  Laterality: N/A;   ESOPHAGOGASTRODUODENOSCOPY N/A 02/21/2021   Procedure: ESOPHAGOGASTRODUODENOSCOPY (EGD);  Surgeon: Ronnette Juniper, MD;  Location: [facility] ENDOSCOPY;  Service: Gastroenterology;  Laterality: N/A;   ESOPHAGOGASTRODUODENOSCOPY (EGD) WITH PROPOFOL N/A 01/30/2021   Procedure: ESOPHAGOGASTRODUODENOSCOPY (EGD) WITH PROPOFOL;  Surgeon: Clarene Essex, MD;  Location: [facility] ENDOSCOPY;  Service: Endoscopy;  Laterality: N/A;   IR IMAGING GUIDED PORT INSERTION  09/07/2020   LEFT HEART CATH AND CORONARY ANGIOGRAPHY N/A 11/23/2019   Procedure: LEFT HEART CATH AND CORONARY ANGIOGRAPHY;  Surgeon: Nigel Mormon, MD;  Location: [facility] CV LAB;  Service: Cardiovascular;  Laterality: N/A;   SUBMUCOSAL INJECTION  02/21/2021   Procedure: SUBMUCOSAL INJECTION;  Surgeon: Ronnette Juniper, MD;  Location: [facility] ENDOSCOPY;  Service: Gastroenterology;;       Family History  Problem Relation Age of Onset   Heart disease Mother    Atrial fibrillation Mother     Social History   Tobacco Use   Smoking status: Never   Smokeless tobacco: Never  Vaping Use   Vaping Use: Never used  Substance Use Topics   Alcohol use: Yes    Comment: 2 beers daily   Drug use: Never    Home Medications Prior to Admission medications   Medication Sig Start Date End Date Taking? Authorizing Provider  acetaminophen (TYLENOL) 325 MG tablet Take 325-650 mg by mouth every 6 (six) hours as needed for mild pain.    [provider]  bisacodyl (DULCOLAX) 5 MG EC tablet Take 10 mg by mouth daily as needed for moderate constipation.    [provider]  buPROPion (WELLBUTRIN XL) 150 MG 24 hr tablet Take 150 mg by mouth daily.  12/17/16   [provider]  EPINEPHrine 0.3 mg/0.3 mL IJ SOAJ injection Inject 0.3 mg into the skin as needed for anaphylaxis (call 911). 05/01/16   [provider]  feeding  supplement (ENSURE ENLIVE / ENSURE PLUS) LIQD Take 237 mLs by mouth 2 (two) times daily between meals. 08/23/20   Regalado, Belkys A, MD  fexofenadine (ALLEGRA) 180 MG tablet Take 180 mg by mouth daily as needed for allergies. 11/30/09   [provider]  metoprolol succinate (TOPROL XL) 50 MG 24 hr tablet Take 1 tablet (50 mg total) by mouth daily. 03/06/20   Patwardhan, Reynold Bowen, MD  Naphazoline HCl (CLEAR EYES OP) Place 1 drop into both eyes daily as needed (irritation / allergies).    [provider]  pantoprazole (PROTONIX) 40 MG tablet Take 1 tablet (40 mg total) by mouth 2 (two) times daily before a meal. 02/22/21 05/23/21  Nolberto Hanlon, MD  sucralfate (CARAFATE) 1 g tablet Take 1 tablet (1 g total) by mouth 4 (four) times daily -  with meals and at bedtime. 02/23/21 05/24/21  Brunetta Genera, MD  vitamin B-12 (CYANOCOBALAMIN) 500 MCG tablet Take 500 mcg by mouth daily.    [provider]  prochlorperazine (COMPAZINE) 10 MG tablet Take 1 tablet (10 mg total) by mouth every 6 (six) hours as needed (Nausea or vomiting). Patient not taking: No sig reported 08/28/20 02/13/21  Brunetta Genera, MD    Allergies    Bee venom and Penicillin g  Review of Systems   Review of Systems  Constitutional:  Positive for fatigue. Negative for fever.  HENT:  Negative for drooling.   Eyes:  Negative for redness.  Respiratory:  Negative for wheezing.   Cardiovascular:  Negative for leg swelling.  Gastrointestinal:  Positive for blood in stool and vomiting.  Genitourinary:  Negative for flank pain.  Musculoskeletal:  Negative for arthralgias.  Skin:  Negative for rash.  Neurological:  Negative for facial asymmetry.  Psychiatric/Behavioral:  Negative for agitation.    Physical Exam Updated Vital Signs BP 137/79   Pulse 95   Temp 98.1 F (36.7 C) (Oral)   Resp 18   SpO2 99%   Physical Exam Vitals and nursing note reviewed. Exam conducted with a chaperone present.   Constitutional:      Appearance: Normal appearance. He is not diaphoretic.  HENT:     Head: Normocephalic and atraumatic.     Nose: Nose normal.  Eyes:     Conjunctiva/sclera: Conjunctivae normal.     Pupils: Pupils are equal, round, and reactive to light.  Cardiovascular:     Rate and Rhythm: Regular rhythm. Tachycardia present.     Pulses: Normal pulses.     Heart sounds: Normal heart sounds.  Pulmonary:     Effort: Pulmonary effort is normal.     Breath sounds: Normal breath sounds.  Abdominal:     General: Abdomen is flat. Bowel sounds are normal.     Palpations: Abdomen is soft.     Tenderness: There is no abdominal tenderness. There is no guarding or rebound.  Genitourinary:    Rectum: Guaiac result positive.  Musculoskeletal:     Cervical back: Normal range of motion and neck supple.  Skin:  General: Skin is warm and dry.     Capillary Refill: Capillary refill takes less than 2 seconds.     Coloration: Skin is pale.  Neurological:     General: No focal deficit present.     Mental Status: He is alert and oriented to person, place, and time.  Psychiatric:        Mood and Affect: Mood normal.        Behavior: Behavior normal.    ED Results / Procedures / Treatments   Labs (all labs ordered are listed, but only abnormal results are displayed) Labs Reviewed  POC OCCULT BLOOD, ED - Abnormal; Notable for the following components:      Result Value   Fecal Occult Bld POSITIVE (*)    All other components within normal limits  RESP PANEL BY RT-PCR (FLU A&B, COVID) ARPGX2  CBC WITH DIFFERENTIAL/PLATELET  PROTIME-INR  COMPREHENSIVE METABOLIC PANEL  I-STAT CHEM 8, ED  TYPE AND SCREEN    EKG None  Radiology No results found.  Procedures Procedures   Medications Ordered in ED Medications  pantoprozole (PROTONIX) 80 mg /NS 100 mL infusion (8 mg/hr Intravenous New Bag/Given 02/20/2021 0516)  pantoprazole (PROTONIX) injection 40 mg (has no administration in time  range)  pantoprazole (PROTONIX) 80 mg /NS 100 mL IVPB (80 mg Intravenous New Bag/Given 03/07/2021 1093)    ED Course  I have reviewed the triage vital signs and the nursing notes.  Pertinent labs & imaging results that were available during my care of the patient were reviewed by me and considered in my medical decision making (see chart for details).   535 am: case d/w Dr. Therisa Doyne. GI has nothing additional to add at this time. Octreotide will not help.  Protonix and carafate for management   MDM Reviewed: previous chart, nursing note and vitals Interpretation: labs Total time providing critical care: 30-74 minutes (protonix drip for GI bleed, blood transfusion). This excludes time spent performing separately reportable procedures and services. Consults: admitting MD and gastrointestinal CRITICAL CARE Performed by: Tabathia Knoche K Khanh Tanori-Rasch Total critical care time: 60 minutes Critical care time was exclusive of separately billable procedures and treating other patients. Critical care was necessary to treat or prevent imminent or life-threatening deterioration. Critical care was time spent personally by me on the following activities: development of treatment plan with patient and/or surrogate as well as nursing, discussions with consultants, evaluation of patient's response to treatment, examination of patient, obtaining history from patient or surrogate, ordering and performing treatments and interventions, ordering and review of laboratory studies, ordering and review of radiographic studies, pulse oximetry and re-evaluation of patient's condition.   Confirmed with Lab hemoglobin is 5.4 with platelets of 74K, they are rerunning   Final Clinical Impression(s) / ED Diagnoses Final diagnoses:  Gastrointestinal hemorrhage, unspecified gastrointestinal hemorrhage type     Admit to medicine     Shelbia Scinto, MD 03/08/2021 Garber, Josephine Wooldridge, MD 02/08/2021 2355

## 2021-02-25 NOTE — ED Notes (Signed)
Per [facility] main lab- recollect needed for COVID swab. Leonides Sake RN advised. ENM

## 2021-02-25 NOTE — Plan of Care (Signed)
Care Plan initiated

## 2021-02-26 ENCOUNTER — Other Ambulatory Visit: Payer: No Typology Code available for payment source

## 2021-02-26 ENCOUNTER — Encounter (HOSPITAL_COMMUNITY): Payer: Self-pay | Admitting: Oncology

## 2021-02-26 DIAGNOSIS — Z7189 Other specified counseling: Secondary | ICD-10-CM

## 2021-02-26 DIAGNOSIS — C7951 Secondary malignant neoplasm of bone: Secondary | ICD-10-CM

## 2021-02-26 DIAGNOSIS — C778 Secondary and unspecified malignant neoplasm of lymph nodes of multiple regions: Secondary | ICD-10-CM | POA: Diagnosis not present

## 2021-02-26 DIAGNOSIS — D500 Iron deficiency anemia secondary to blood loss (chronic): Secondary | ICD-10-CM

## 2021-02-26 DIAGNOSIS — C160 Malignant neoplasm of cardia: Secondary | ICD-10-CM

## 2021-02-26 DIAGNOSIS — K920 Hematemesis: Principal | ICD-10-CM

## 2021-02-26 DIAGNOSIS — C158 Malignant neoplasm of overlapping sites of esophagus: Secondary | ICD-10-CM

## 2021-02-26 DIAGNOSIS — D696 Thrombocytopenia, unspecified: Secondary | ICD-10-CM

## 2021-02-26 DIAGNOSIS — Z66 Do not resuscitate: Secondary | ICD-10-CM

## 2021-02-26 DIAGNOSIS — D649 Anemia, unspecified: Secondary | ICD-10-CM

## 2021-02-26 DIAGNOSIS — K922 Gastrointestinal hemorrhage, unspecified: Secondary | ICD-10-CM

## 2021-02-26 DIAGNOSIS — I10 Essential (primary) hypertension: Secondary | ICD-10-CM

## 2021-02-26 DIAGNOSIS — Z515 Encounter for palliative care: Secondary | ICD-10-CM

## 2021-02-26 DIAGNOSIS — F32A Depression, unspecified: Secondary | ICD-10-CM

## 2021-02-26 LAB — BASIC METABOLIC PANEL
Anion gap: 6 (ref 5–15)
BUN: 40 mg/dL — ABNORMAL HIGH (ref 8–23)
CO2: 23 mmol/L (ref 22–32)
Calcium: 7.3 mg/dL — ABNORMAL LOW (ref 8.9–10.3)
Chloride: 101 mmol/L (ref 98–111)
Creatinine, Ser: 1.56 mg/dL — ABNORMAL HIGH (ref 0.61–1.24)
GFR, Estimated: 50 mL/min — ABNORMAL LOW (ref >60)
Glucose, Bld: 109 mg/dL — ABNORMAL HIGH (ref 70–99)
Potassium: 3.7 mmol/L (ref 3.5–5.1)
Sodium: 130 mmol/L — ABNORMAL LOW (ref 135–145)

## 2021-02-26 LAB — CBC
HCT: 20.4 % — ABNORMAL LOW (ref 39.0–52.0)
Hemoglobin: 6.9 g/dL — CL (ref 13.0–17.0)
MCH: 30 pg (ref 26.0–34.0)
MCHC: 33.8 g/dL (ref 30.0–36.0)
MCV: 88.7 fL (ref 80.0–100.0)
Platelets: 84 10*3/uL — ABNORMAL LOW (ref 150–400)
RBC: 2.3 MIL/uL — ABNORMAL LOW (ref 4.22–5.81)
RDW: 17.2 % — ABNORMAL HIGH (ref 11.5–15.5)
WBC: 8.4 10*3/uL (ref 4.0–10.5)
nRBC: 0.4 % — ABNORMAL HIGH (ref 0.0–0.2)

## 2021-02-26 LAB — HEMOGLOBIN AND HEMATOCRIT, BLOOD
HCT: 24 % — ABNORMAL LOW (ref 39.0–52.0)
Hemoglobin: 8.2 g/dL — ABNORMAL LOW (ref 13.0–17.0)

## 2021-02-26 LAB — PREPARE RBC (CROSSMATCH)

## 2021-02-26 MED ORDER — SENNOSIDES-DOCUSATE SODIUM 8.6-50 MG PO TABS
1.0000 | ORAL_TABLET | Freq: Two times a day (BID) | ORAL | Status: DC
  Administered 2021-02-26 – 2021-02-28 (×4): 1 via ORAL
  Filled 2021-02-26 (×7): qty 1

## 2021-02-26 MED ORDER — ACETAMINOPHEN 325 MG PO TABS
650.0000 mg | ORAL_TABLET | Freq: Four times a day (QID) | ORAL | Status: DC | PRN
  Administered 2021-02-26: 650 mg via ORAL
  Filled 2021-02-26: qty 2

## 2021-02-26 MED ORDER — ALPRAZOLAM 0.25 MG PO TABS
0.2500 mg | ORAL_TABLET | Freq: Four times a day (QID) | ORAL | Status: DC | PRN
  Administered 2021-02-26 – 2021-02-27 (×3): 0.25 mg via ORAL
  Filled 2021-02-26 (×3): qty 1

## 2021-02-26 MED ORDER — MORPHINE SULFATE (PF) 2 MG/ML IV SOLN
2.0000 mg | INTRAVENOUS | Status: DC | PRN
  Administered 2021-03-01 – 2021-03-02 (×4): 2 mg via INTRAVENOUS
  Filled 2021-02-26 (×4): qty 1

## 2021-02-26 MED ORDER — POLYETHYLENE GLYCOL 3350 17 G PO PACK: 17.0000 g | PACK | Freq: Every day | ORAL | Status: DC | PRN

## 2021-02-26 MED ORDER — SODIUM CHLORIDE 0.9% IV SOLUTION: Freq: Once | INTRAVENOUS | Status: DC

## 2021-02-26 MED ORDER — BISACODYL 10 MG RE SUPP
10.0000 mg | Freq: Every day | RECTAL | Status: DC | PRN
  Filled 2021-02-26: qty 1

## 2021-02-26 MED ORDER — OXYCODONE HCL 5 MG PO TABS
5.0000 mg | ORAL_TABLET | ORAL | Status: DC | PRN
  Administered 2021-02-27 – 2021-02-28 (×3): 5 mg via ORAL
  Filled 2021-02-26 (×3): qty 1

## 2021-02-26 NOTE — Progress Notes (Signed)
Transferred to [place] floor with belongings via wheelchair, Protonix gtt going at 8 mg/hr, patient alert and oriented x 4. No other needs identified.

## 2021-02-26 NOTE — Progress Notes (Signed)
PT Cancellation Note  Patient Details Name: Donald Wade MRN: [id number] DOB: 1956-09-04   Cancelled Treatment:    Reason Eval/Treat Not Completed: Medical issues which prohibited therapy, Noted patient to have Palliative consult. Will follow.    Claretha Cooper 02/26/2021, 9:24 AM [place] Pager [phone] Office [phone]

## 2021-02-26 NOTE — Consult Note (Signed)
Consultation Note Date: 02/26/2021   Patient Name: Donald Wade  DOB: January 06, 1956  MRN: [id number]  Age / Sex: 65 y.o., male   PCP: Berkley Harvey, NP Referring Physician: Aline August, MD   REASON FOR CONSULTATION:Establishing goals of care  Palliative Care consult requested for goals of care discussion in this 65 y.o. 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 He presented to the ED from home with concerns of hematemesis x3 episodes. Patient recently hospitalized (6/14-6/16)for GI bleed. He underwent upper endoscopy during previous admission which showed oozing from underlying cancer. Epinephrine injection administered. During work-up hemoglobin 5.4 and platelet count 86. Since admission patient is s/p 3 units packed red blood cells. GI per notations has no additional recommendations or interventions to offer.   Clinical Assessment and Goals of Care: I have reviewed medical records including lab results, imaging, Epic notes, and MAR, received report from the bedside RN, and assessed the patient.   I met at the bedside with Donald Wade, his wife [name]) and daughter, Ander Purpura (who is a Software engineer) to discuss diagnosis prognosis, [place], EOL wishes, disposition and options.  Donald Wade is awake, alert and oriented. Denies pain or shortness of breath.   I introduced Palliative Medicine as specialized medical care for people living with serious illness. It focuses on providing relief from the symptoms and stress of a serious illness. The goal is to improve quality of life for both the patient and the family. He and family verbalized understanding and appreciation of support.   We discussed a brief life review of the patient, along with his functional and nutritional status. Donald Wade shares he and his wife have been married for over 72 years. He has 2  children, Lauren and Tanna Furry. He enjoys spending time with his family.   Patient acknowledges decreased in appetite and oral nutrition and increased weakness. Prior to admission wife would assist with ADLs and care needs.   We discussed His current illness and what it means in the larger context of His on-going co-morbidities. Natural disease trajectory and expectations at EOL were discussed.  Patient and family are realistic in their understanding of his current illness, prognosis, and disease trajectory. They share patient has been given a prognosis of 6 months or less during previous Oncology discussions. He speaks to previous plans for second opinion however, he knows that his health is declining and he is facing end-of-life. Support provided.   Donald Wade and family expressed wishes to further gain some insight from Oncology prior to making any final decisions. They are aware from GI standpoint there are not any further options available to assist with his bleeding. Patient and family verbalize understanding.   I discussed at length what is most important to Donald Wade and what he would want for himself. He acknowledges he is not getting better. His wishes would to be at home with surrounded by his family and friends with what time he has left if there are not any further options to pro-long th inevitable but also not cause any further decline to his current quality of life. He is open to options but speaks to understanding his awareness there are most likely not any available or that he would be able to withstand functionally.    A detailed discussion was had today regarding advanced directives.  Concepts specific to code status, artifical feeding and hydration, continued IV antibiotics and rehospitalization. Patient has  a documented advanced directive which wife has at the bedside and are on file. Personally reviewed.   We discussed at length his current full code status with consideration to  his current illness and co-morbidities. He and family all mutually agree that DNR/DNI is appropriate given his poor prognosis. Education provided and patient/family aware code status will be changed as requested.   The difference between a aggressive medical intervention and a palliative comfort care path were discussed at length.   We discussed at length symptom management in regards to his anxiety (more specifically when he is alone), constipation, and pain. Dr. Starla Link also at the bedside providing updates and support.   Values and goals of care important to patient and family were attempted to be elicited.    I discussed the importance of continued conversation with family and their medical providers regarding overall plan of care and treatment options, ensuring decisions are within the context of the patients values and GOCs.  Family would like to defer any final decisions and further discussions until tomorrow allowing additional input from the Oncology team.   Hospice and Palliative Care services outpatient were explained and offered. Patient and family verbalized their understanding and awareness of both palliative and hospice's goals and philosophy of care. We plan to further discuss during follow-up tomorrow. Patient and family are leaning more towards hospice support.   Questions and concerns were addressed.  Hard Choices booklet left for review. The family was encouraged to call with questions or concerns.  PMT will continue to support holistically as needed.   CODE STATUS: DNR  ADVANCE DIRECTIVES: Primary Decision Maker:  Patient with support from his wife and daughter.    SYMPTOM MANAGEMENT: Xanax for anxiety, Oxy IR for pain, Senna for constipation  Palliative Prophylaxis:  Bowel Regimen, Frequent Pain Assessment, and Oral Care  PSYCHO-SOCIAL/SPIRITUAL: Support System: Family Desire for further Chaplaincy support:no  Additional Recommendations (Limitations, Scope,  Preferences): DNR/DNI Education on hospice/palliative    PAST MEDICAL HISTORY: Past Medical History:  Diagnosis Date   Anemia 08/22/2020   Cancer ([place])    Heart murmur    Hyperlipidemia    Hypertension    Port-A-Cath in place     ALLERGIES:  is allergic to bee venom and penicillin g.   MEDICATIONS:  Current Facility-Administered Medications  Medication Dose Route Frequency Provider Last Rate Last Admin   0.9 %  sodium chloride infusion (Manually program via Guardrails IV Fluids)   Intravenous Once Nicolette Bang, MD   Held at 02/24/2021 0918   0.9 %  sodium chloride infusion (Manually program via Guardrails IV Fluids)   Intravenous Once Lovey Newcomer T, NP       acetaminophen (TYLENOL) tablet 650 mg  650 mg Oral Q6H PRN Aline August, MD   650 mg at 02/26/21 1105   ALPRAZolam (XANAX) tablet 0.25 mg  0.25 mg Oral QID PRN Aline August, MD   0.25 mg at 02/26/21 1227   bisacodyl (DULCOLAX) suppository 10 mg  10 mg Rectal Daily PRN Aline August, MD       buPROPion (WELLBUTRIN XL) 24 hr tablet 150 mg  150 mg Oral Daily Nicolette Bang, MD   150 mg at 02/26/21 1104   Chlorhexidine Gluconate Cloth 2 % PADS 6 each  6 each Topical Daily Nicolette Bang, MD   6 each at 02/07/2021 1900   milk and molasses enema  1 enema Rectal Once PRN Reubin Milan, MD       morphine  2 MG/ML injection 2 mg  2 mg Intravenous Q2H PRN Aline August, MD       ondansetron (ZOFRAN) injection 4-8 mg  4-8 mg Intravenous Q8H PRN Nicolette Bang, MD   8 mg at 02/26/21 0944   oxyCODONE (Oxy IR/ROXICODONE) immediate release tablet 5 mg  5 mg Oral Q4H PRN Aline August, MD       [START ON 02/28/2021] pantoprazole (PROTONIX) injection 40 mg  40 mg Intravenous Q12H Nicolette Bang, MD       pantoprozole (PROTONIX) 80 mg /NS 100 mL infusion  8 mg/hr Intravenous Continuous Nicolette Bang, MD 10 mL/hr at 02/26/21 0006 8 mg/hr at 02/26/21 0006   polyethylene  glycol (MIRALAX / GLYCOLAX) packet 17 g  17 g Oral Daily PRN Aline August, MD       senna-docusate (Senokot-S) tablet 1 tablet  1 tablet Oral BID Alekh, Kshitiz, MD       sodium chloride flush (NS) 0.9 % injection 10-40 mL  10-40 mL Intracatheter Q12H Nicolette Bang, MD   10 mL at 02/15/2021 2230   sodium chloride flush (NS) 0.9 % injection 10-40 mL  10-40 mL Intracatheter PRN Nicolette Bang, MD       sucralfate (CARAFATE) tablet 1 g  1 g Oral TID WC & HS Nicolette Bang, MD   1 g at 02/26/21 1104    VITAL SIGNS: BP (!) 148/84 (BP Location: Right Arm)   Pulse (!) 102   Temp (!) 97.4 F (36.3 C) (Oral)   Resp 19   SpO2 96%  There were no vitals filed for this visit.  Estimated body mass index is 22.97 kg/m as calculated from the following:   Height as of 01/26/21: 6' (1.829 m).   Weight as of 02/01/21: 76.8 kg.  LABS: CBC:    Component Value Date/Time   WBC 8.4 02/26/2021 0519   HGB 8.2 (L) 02/26/2021 1220   HGB 10.6 (L) 11/21/2020 1443   HGB 13.5 11/17/2019 1349   HCT 24.0 (L) 02/26/2021 1220   HCT 39.0 11/17/2019 1349   PLT 84 (L) 02/26/2021 0519   PLT 237 11/21/2020 1443   PLT 259 11/17/2019 1349   Comprehensive Metabolic Panel:    Component Value Date/Time   NA 130 (L) 02/26/2021 0519   NA 134 05/31/2020 1101   K 3.7 02/26/2021 0519   BUN 40 (H) 02/26/2021 0519   BUN 10 05/31/2020 1101   CREATININE 1.56 (H) 02/26/2021 0519   CREATININE 0.85 01/01/2021 1350   ALBUMIN 2.2 (L) 03/04/2021 0449     Review of Systems  Neurological:  Positive for weakness.  Unless otherwise noted, a complete review of systems is negative.  Physical Exam General: NAD Cardiovascular: regular rate and rhythm Pulmonary: diminished bilaterally  Abdomen: soft, nontender, + bowel sounds Extremities: no edema, no joint deformities Skin: no rashes, warm and dry Neurological: AAO x3, mood appropriate   Prognosis: POOR, days to weeks in the setting of  continued GIB, weakness, deconditioning, decreased appetite, metastatic GE Junction cancer s/p chemoradiation, no further GI interventions available.  Discharge Planning:  To Be Determined  Recommendations: DNR/DNI-as requested and confirmed by patient Continue with current plan of care, patient pending final decisions once input from Oncology. Patient and family clear in expressed goals for comfort, no suffering, and spending what time he has left with family and friends. Is open for further Oncology options, understands no further GI options. Patient and family realistic in their understanding of  poor prognosis.  Leaning towards home with hospice support, however no final decisions until he hears from all team members. Family requesting follow-up tomorrow. We plan to meet around 10-11. Visitation to include patient's daughter, wife, and brother. (Order placed).  As discussed with Dr. Rhae Lerner daily for constipation Miralax PRN for constipation Oxy IR PRN for pain Increase Xanax to QID for anxiety PMT will continue to support and follow as needed. Please call team line with urgent needs.   Palliative Performance Scale: PPS 20-30%              Patient and family expressed understanding and was in agreement with this plan.   Thank you for allowing the Palliative Medicine Team to assist in the care of this patient. Please utilize secure chat with additional questions, if there is no response within 30 minutes please call the above phone number.   Time In: 1100 Time Out: 1215 Time Total: 75 min.   Visit consisted of counseling and education dealing with the complex and emotionally intense issues of symptom management and palliative care in the setting of serious and potentially life-threatening illness.Greater than 50%  of this time was spent counseling and coordinating care related to the above assessment and plan.  Signed by:  Alda Lea, AGPCNP-BC Palliative Medicine  Team  Phone: [phone] Pager: [phone] Amion: [place] Team providers are available by phone from 7am to 7pm daily and can be reached through the team cell phone.  Should this patient require assistance outside of these hours, please call the patient's attending physician.

## 2021-02-26 NOTE — Progress Notes (Signed)
Patient ID: Donald Wade, male   DOB: [birth year], 65 y.o.   MRN: [id number]  PROGRESS NOTE    Kunal Levario Langbehn  EXB:[id number] DOB: June 17, 1956 DOA: 02/14/2021 PCP: Berkley Harvey, NP   Brief Narrative:  65 year old male with history of gastroesophageal junction adenocarcinoma with metastases, hypertension, hyperlipidemia, recent hospitalization from 02/20/2021-02/22/2021 for GI bleeding requiring 3 units packed red cells transfusion status post EGD which had shown diffuse oozing from underlying gastric cancer which was injected with epinephrine and patient was discharged on PPI and Carafate presented with hematemesis.  ED provider discussed with GI/Dr. Therisa Doyne on phone on admission and apparently GI has nothing additional to add at this time since the bleeding is probably from underlying gastric cancer which is not amenable to endoscopic therapy.  Hemoglobin was 5.4.  He was transfused packed red cells.  Assessment & Plan:   Acute blood loss anemia Most likely upper GI bleeding in a patient with known GE junction adenocarcinoma Thrombocytopenia - ED provider discussed with GI/Dr. Therisa Doyne on phone on admission and apparently GI has nothing additional to add at this time since the bleeding is probably from underlying gastric cancer which is not amenable to endoscopic therapy. -Status post 2 units packed red cells transfusion on admission.  Hemoglobin still 6.9 this morning.  Being transfused 1 more unit today. -Monitor H&H.  Continue Protonix and Carafate. -Oncology and palliative care evaluation is pending.  Overall prognosis is guarded to poor.  Currently full code.  Hypertension -Blood pressure improved.  Metoprolol on hold.  Might have to resume it.  Depression--continue Wellbutrin  Acute kidney injury -Encourage oral intake. -Monitor  DVT prophylaxis: SCDs Code Status: Full Family Communication: Wife and daughter at bedside Disposition Plan: Status is: Inpatient  Remains  inpatient appropriate because:Inpatient level of care appropriate due to severity of illness  Dispo: The patient is from: Home              Anticipated d/c is to: Home with possible hospice if patient agreeable              Patient currently is not medically stable to d/c.   Difficult to place patient No  Consultants: Oncology/palliative care.  ED provider spoke to GI on-call on presentation on phone  Procedures: None  Antimicrobials: None   Subjective: Patient seen and examined at bedside.  No overnight fever or vomiting reported.  No worsening shortness of breath or chest pain.  Complains of constipation and intermittent anxiety.  Objective: Vitals:   02/26/21 0431 02/26/21 0630 02/26/21 0659 02/26/21 0904  BP: 139/77 136/73 140/76 (!) 148/84  Pulse: (!) 107 (!) 105 (!) 103 94  Resp: 17  18 20   Temp: 98.2 F (36.8 C) (!) 97.5 F (36.4 C) 97.6 F (36.4 C) 97.8 F (36.6 C)  TempSrc: Oral Oral Oral Oral  SpO2: 95%   97%    Intake/Output Summary (Last 24 hours) at 02/26/2021 1102 Last data filed at 02/26/2021 0904 Gross per 24 hour  Intake 1965.4 ml  Output 300 ml  Net 1665.4 ml   There were no vitals filed for this visit.  Examination:  General exam: Appears calm and comfortable  Respiratory system: Bilateral decreased breath sounds at bases Cardiovascular system: S1 & S2 heard, intermittently tachycardic Gastrointestinal system: Abdomen is nondistended, soft and nontender. Normal bowel sounds heard. Extremities: No cyanosis, clubbing, edema  Central nervous system: Alert and oriented. No focal neurological deficits. Moving extremities Skin: No rashes, lesions or ulcers Psychiatry: Looks intermittently  anxious   Data Reviewed: I have personally reviewed following labs and imaging studies  CBC: Recent Labs  Lab 02/20/21 1525 02/21/21 0520 02/21/21 1425 02/21/21 2302 02/22/21 0425 02/27/2021 0449 02/26/2021 2113 02/26/21 0519  WBC 6.9 7.0  --   --  6.2 8.2  --   8.4  NEUTROABS 5.1  --   --   --   --   --   --   --   HGB 5.4* 8.5*   < > 7.9* 7.9* 5.4* 7.5* 6.9*  HCT 16.0* 24.5*   < > 23.3* 23.1* 15.9* 22.1* 20.4*  MCV 97.0 90.7  --   --  92.4 93.5  --  88.7  PLT 145* 109*  --   --  93* 86*  --  84*   < > = values in this interval not displayed.   Basic Metabolic Panel: Recent Labs  Lab 02/20/21 1525 02/21/21 0520 02/22/21 0425 02/23/2021 0449 02/26/21 0519  NA 135 134* 135 134* 130*  K 3.8 3.6 3.5 3.6 3.7  CL 101 102 103 104 101  CO2 26 23 24 23 23   GLUCOSE 109* 97 95 116* 109*  BUN 31* 28* 26* 33* 40*  CREATININE 1.33* 1.54* 1.33* 1.31* 1.56*  CALCIUM 8.2* 8.1* 7.7* 7.4* 7.3*   GFR: CrCl cannot be calculated (Unknown ideal weight.). Liver Function Tests: Recent Labs  Lab 02/20/21 1525 02/21/21 0520 02/26/2021 0449  AST 42* 37 39  ALT 11 11 12   ALKPHOS 240* 235* 239*  BILITOT 0.3 0.8 0.9  PROT 6.0* 5.5* 4.5*  ALBUMIN 2.9* 2.7* 2.2*   No results for input(s): LIPASE, AMYLASE in the last 168 hours. No results for input(s): AMMONIA in the last 168 hours. Coagulation Profile: Recent Labs  Lab 02/20/21 1525 03/04/2021 0449  INR 1.3* 1.5*   Cardiac Enzymes: No results for input(s): CKTOTAL, CKMB, CKMBINDEX, TROPONINI in the last 168 hours. BNP (last 3 results) No results for input(s): PROBNP in the last 8760 hours. HbA1C: No results for input(s): HGBA1C in the last 72 hours. CBG: No results for input(s): GLUCAP in the last 168 hours. Lipid Profile: No results for input(s): CHOL, HDL, LDLCALC, TRIG, CHOLHDL, LDLDIRECT in the last 72 hours. Thyroid Function Tests: No results for input(s): TSH, T4TOTAL, FREET4, T3FREE, THYROIDAB in the last 72 hours. Anemia Panel: No results for input(s): VITAMINB12, FOLATE, FERRITIN, TIBC, IRON, RETICCTPCT in the last 72 hours. Sepsis Labs: No results for input(s): PROCALCITON, LATICACIDVEN in the last 168 hours.  Recent Results (from the past 240 hour(s))  SARS CORONAVIRUS 2 (TAT 6-24  HRS) Nasopharyngeal Nasopharyngeal Swab     Status: None   Collection Time: 02/20/21  3:47 PM   Specimen: Nasopharyngeal Swab  Result Value Ref Range Status   SARS Coronavirus 2 NEGATIVE NEGATIVE Final    Comment: (NOTE) SARS-CoV-2 target nucleic acids are NOT DETECTED.  The SARS-CoV-2 RNA is generally detectable in upper and lower respiratory specimens during the acute phase of infection. Negative results do not preclude SARS-CoV-2 infection, do not rule out co-infections with other pathogens, and should not be used as the sole basis for treatment or other patient management decisions. Negative results must be combined with clinical observations, patient history, and epidemiological information. The expected result is Negative.  Fact Sheet for Patients: SugarRoll.be  Fact Sheet for Healthcare Providers: https://www.woods-mathews.com/  This test is not yet approved or cleared by the [place] FDA and  has been authorized for detection and/or diagnosis of SARS-CoV-2 by FDA  under an Emergency Use Authorization (EUA). This EUA will remain  in effect (meaning this test can be used) for the duration of the COVID-19 declaration under Se ction 564(b)(1) of the Act, 21 U.S.C. section 360bbb-3(b)(1), unless the authorization is terminated or revoked sooner.  Performed at [facility], [place] [address] [place], [place] [address]   Resp Panel by RT-PCR (Flu A&B, Covid) Nasopharyngeal Swab     Status: None   Collection Time: 02/23/2021  5:29 AM   Specimen: Nasopharyngeal Swab; Nasopharyngeal(NP) swabs in vial transport medium  Result Value Ref Range Status   SARS Coronavirus 2 by RT PCR NEGATIVE NEGATIVE Final    Comment: (NOTE) SARS-CoV-2 target nucleic acids are NOT DETECTED.  The SARS-CoV-2 RNA is generally detectable in upper respiratory specimens during the acute phase of infection. The lowest concentration of SARS-CoV-2 viral copies  this assay can detect is 138 copies/mL. A negative result does not preclude SARS-Cov-2 infection and should not be used as the sole basis for treatment or other patient management decisions. A negative result may occur with  improper specimen collection/handling, submission of specimen other than nasopharyngeal swab, presence of viral mutation(s) within the areas targeted by this assay, and inadequate number of viral copies(<138 copies/mL). A negative result must be combined with clinical observations, patient history, and epidemiological information. The expected result is Negative.  Fact Sheet for Patients:  EntrepreneurPulse.com.au  Fact Sheet for Healthcare Providers:  IncredibleEmployment.be  This test is no t yet approved or cleared by the [place] FDA and  has been authorized for detection and/or diagnosis of SARS-CoV-2 by FDA under an Emergency Use Authorization (EUA). This EUA will remain  in effect (meaning this test can be used) for the duration of the COVID-19 declaration under Section 564(b)(1) of the Act, 21 U.S.C.section 360bbb-3(b)(1), unless the authorization is terminated  or revoked sooner.       Influenza A by PCR NEGATIVE NEGATIVE Final   Influenza B by PCR NEGATIVE NEGATIVE Final    Comment: (NOTE) The Xpert Xpress SARS-CoV-2/FLU/RSV plus assay is intended as an aid in the diagnosis of influenza from Nasopharyngeal swab specimens and should not be used as a sole basis for treatment. Nasal washings and aspirates are unacceptable for Xpert Xpress SARS-CoV-2/FLU/RSV testing.  Fact Sheet for Patients: EntrepreneurPulse.com.au  Fact Sheet for Healthcare Providers: IncredibleEmployment.be  This test is not yet approved or cleared by the [place] FDA and has been authorized for detection and/or diagnosis of SARS-CoV-2 by FDA under an Emergency Use Authorization (EUA). This EUA  will remain in effect (meaning this test can be used) for the duration of the COVID-19 declaration under Section 564(b)(1) of the Act, 21 U.S.C. section 360bbb-3(b)(1), unless the authorization is terminated or revoked.  Performed at [facility], [place] [address], [place], [place] [address]          Radiology Studies: No results found.      Scheduled Meds:  sodium chloride   Intravenous Once   sodium chloride   Intravenous Once   buPROPion  150 mg Oral Daily   Chlorhexidine Gluconate Cloth  6 each Topical Daily   [START ON 02/28/2021] pantoprazole  40 mg Intravenous Q12H   sodium chloride flush  10-40 mL Intracatheter Q12H   sucralfate  1 g Oral TID WC & HS   Continuous Infusions:  pantoprazole 8 mg/hr (02/26/21 0006)          Aline August, MD [facility] 02/26/2021, 11:02 AM

## 2021-02-26 NOTE — Consult Note (Addendum)
[place]  [phone] [phone]   MEDICAL ONCOLOGY - CONSULTATION  Referral MD: Dr. 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 Garman is a 65 year old male with a past medical history significant for GE junction carcinoma, hypertension, and hyperlipidemia.  The patient presented to the hospital this past weekend with hematemesis.  He had 3 episodes of vomiting bright red blood and coffee-ground appearance.  On admission, his hemoglobin was 5.4 and platelet count 86,000.  He has received 2 units PRBC so far with a 3rd unit in process.  The patient also had a recent hospitalization 6/14 through 6/16 for GI bleed.  During that hospitalization, he underwent upper endoscopy which showed diffuse oozing from his underlying gastric cancer.  Epinephrine was injected but they anticipate ongoing bleeding from the gastric cancer in the future.  There was really nothing endoscopically they can offer above this.  The patient has been followed by Dr. Irene Limbo in our office.  He initially received concurrent chemoradiation with chemotherapy consisting of carboplatin and paclitaxel.  Radiation was given under the care of Dr. Lisbeth Renshaw.  Last dose of radiation was given on 10/18/2020.  He had a restaging PET scan performed 12/25/2020 which showed interval mixed response to therapy, hypermetabolism associate with the primary gastric lesion is stable to minimally decreased in previously identified gastrohepatic ligament and retroperitoneal lymph nodes measure minimally smaller, interval increase in the size of the left thoracic inlet lymph node with mild hypermetabolism, new metastatic lesion of concern, interval development of scattered bone lesions showing low-level hypermetabolism with subtle sclerosis on CT imaging and appearance is worrisome for bony metastatic disease.  He was last seen in  our office 5/26 with plans to initiate systemic chemotherapy with FOLFOX.  He has not yet started his chemotherapy.  He was also referred to [facility] for a second opinion which I believe is supposed to happen later this week.  The patient was seen in his hospital room today.  He has several family members at the bedside.  He reports that he is feeling better since receiving a blood transfusion.  He still has ongoing weakness and fatigue.  His dizziness has resolved.  He reported that he had a bowel movement last evening which was black in color.  He has not had any recurrent hematemesis.  He is not having any chest pain or shortness of breath.  No other bleeding has been reported other than the melena and hematemesis.  He notes a "hardened area" just below his chin which has shown up in the past 2 weeks.  He also has a lump in his left axilla which was evaluated previously by his primary oncologist.  The lump in the left axilla has not changed in size.  Medical oncology was asked to see the patient to make recommendations regarding his cancer of the GE junction.   Past Medical History:  Diagnosis Date   Anemia 08/22/2020   Cancer ([place])    Heart murmur    Hyperlipidemia    Hypertension    Port-A-Cath in place   :   Past Surgical History:  Procedure Laterality Date   APPENDECTOMY  1980   BIOPSY  08/22/2020   Procedure: BIOPSY;  Surgeon: Clarene Essex, MD;  Location: [facility];  Service: Endoscopy;;   BIOPSY  01/30/2021   Procedure: BIOPSY;  Surgeon: Clarene Essex, MD;  Location: [facility];  Service: Endoscopy;;  ESOPHAGOGASTRODUODENOSCOPY  08/22/2020   ESOPHAGOGASTRODUODENOSCOPY N/A 08/22/2020   Procedure: ESOPHAGOGASTRODUODENOSCOPY (EGD);  Surgeon: Clarene Essex, MD;  Location: [facility];  Service: Endoscopy;  Laterality: N/A;   ESOPHAGOGASTRODUODENOSCOPY N/A 02/21/2021   Procedure: ESOPHAGOGASTRODUODENOSCOPY (EGD);  Surgeon: Ronnette Juniper, MD;  Location: [facility];  Service:  Gastroenterology;  Laterality: N/A;   ESOPHAGOGASTRODUODENOSCOPY (EGD) WITH PROPOFOL N/A 01/30/2021   Procedure: ESOPHAGOGASTRODUODENOSCOPY (EGD) WITH PROPOFOL;  Surgeon: Clarene Essex, MD;  Location: [facility];  Service: Endoscopy;  Laterality: N/A;   IR IMAGING GUIDED PORT INSERTION  09/07/2020   LEFT HEART CATH AND CORONARY ANGIOGRAPHY N/A 11/23/2019   Procedure: LEFT HEART CATH AND CORONARY ANGIOGRAPHY;  Surgeon: Nigel Mormon, MD;  Location: [place] [facility];  Service: Cardiovascular;  Laterality: N/A;   SUBMUCOSAL INJECTION  02/21/2021   Procedure: SUBMUCOSAL INJECTION;  Surgeon: Ronnette Juniper, MD;  Location: [facility];  Service: Gastroenterology;;  :   Current Facility-Administered Medications  Medication Dose Route Frequency Provider Last Rate Last Admin   0.9 %  sodium chloride infusion (Manually program via Guardrails IV Fluids)   Intravenous Once Nicolette Bang, MD   Held at 02/21/2021 0918   0.9 %  sodium chloride infusion (Manually program via Guardrails IV Fluids)   Intravenous Once Lovey Newcomer T, NP       acetaminophen (TYLENOL) tablet 650 mg  650 mg Oral Q6H PRN Aline August, MD       buPROPion (WELLBUTRIN XL) 24 hr tablet 150 mg  150 mg Oral Daily Nicolette Bang, MD   150 mg at 02/24/2021 1728   Chlorhexidine Gluconate Cloth 2 % PADS 6 each  6 each Topical Daily Nicolette Bang, MD   6 each at 03/04/2021 1900   milk and molasses enema  1 enema Rectal Once PRN Reubin Milan, MD       ondansetron [facility]) injection 4-8 mg  4-8 mg Intravenous Q8H PRN Nicolette Bang, MD   8 mg at 02/26/21 0944   [START ON 02/28/2021] pantoprazole (PROTONIX) injection 40 mg  40 mg Intravenous Q12H Nicolette Bang, MD       pantoprozole (PROTONIX) 80 mg /NS 100 mL infusion  8 mg/hr Intravenous Continuous Nicolette Bang, MD 10 mL/hr at 02/26/21 0006 8 mg/hr at 02/26/21 0006   sodium chloride flush (NS) 0.9 % injection 10-40 mL   10-40 mL Intracatheter Q12H Nicolette Bang, MD   10 mL at 02/12/2021 2230   sodium chloride flush (NS) 0.9 % injection 10-40 mL  10-40 mL Intracatheter PRN Nicolette Bang, MD       sucralfate (CARAFATE) tablet 1 g  1 g Oral TID WC & HS Nicolette Bang, MD   1 g at 02/24/2021 2229      Allergies  Allergen Reactions   Bee Venom Anaphylaxis   Penicillin G Anaphylaxis, Hives and Swelling    Did it involve swelling of the face/tongue/throat, SOB, or low BP? Yes Did it involve sudden or severe rash/hives, skin peeling, or any reaction on the inside of your mouth or nose? Yes Did you need to seek medical attention at a hospital or doctor's office? Yes When did it last happen?      1990 If all above answers are "NO", may proceed with cephalosporin use.  :   Family History  Problem Relation Age of Onset   Heart disease Mother    Atrial fibrillation Mother   :   Social History   Socioeconomic History  Marital status: Married    Spouse name: Not on file   Number of children: 2   Years of education: Not on file   Highest education level: Not on file  Occupational History   Not on file  Tobacco Use   Smoking status: Never   Smokeless tobacco: Never  Vaping Use   Vaping Use: Never used  Substance and Sexual Activity   Alcohol use: Yes    Comment: 2 beers daily   Drug use: Never   Sexual activity: Not on file  Other Topics Concern   Not on file  Social History Narrative   Not on file   Social Determinants of Health   Financial Resource Strain: Not on file  Food Insecurity: Not on file  Transportation Needs: Not on file  Physical Activity: Not on file  Stress: Not on file  Social Connections: Not on file  Intimate Partner Violence: Not on file  :  Review of Systems: A comprehensive 14 point review of systems was negative except as noted in the HPI.  Exam: Patient Vitals for the past 24 hrs:  BP Temp Temp src Pulse Resp SpO2  02/26/21  0904 (!) 148/84 97.8 F (36.6 C) Oral 94 20 97 %  02/26/21 0659 140/76 97.6 F (36.4 C) Oral (!) 103 18 --  02/26/21 0630 136/73 (!) 97.5 F (36.4 C) Oral (!) 105 -- --  02/26/21 0431 139/77 98.2 F (36.8 C) Oral (!) 107 17 95 %  02/07/2021 2038 (!) 146/78 97.7 F (36.5 C) Oral (!) 108 18 95 %  03/06/2021 1723 (!) 159/84 97.6 F (36.4 C) Oral (!) 104 18 97 %  03/06/2021 1706 (!) 159/78 (!) 97.3 F (36.3 C) Oral (!) 105 20 97 %  02/24/2021 1355 (!) 162/82 97.9 F (36.6 C) Oral (!) 103 18 98 %  03/07/2021 1340 (!) 149/84 97.6 F (36.4 C) Oral (!) 101 18 98 %  03/03/2021 1305 (!) 150/82 97.6 F (36.4 C) Oral (!) 102 18 100 %  02/22/2021 1240 (!) 150/82 97.6 F (36.4 C) Oral (!) 102 18 100 %  02/16/2021 1110 (!) 154/79 97.9 F (36.6 C) Oral (!) 108 (!) 32 98 %    General: Awake and alert, no distress. Eyes:  no scleral icterus.   ENT:  There were no oropharyngeal lesions.     Lymphatics: Palpable submandibular mass, left supraclavicular and axillary lymphadenopathy Respiratory: lungs were clear bilaterally without wheezing or crackles.   Cardiovascular:  Regular rate and rhythm, S1/S2, without murmur, rub or gallop.  There was no pedal edema.   GI:  abdomen was soft, flat, nontender, nondistended, without organomegaly.   Musculoskeletal: Strength symmetrical in the upper and lower extremities. Skin exam was without echymosis, petichae.   Neuro exam was nonfocal. Patient was alert and oriented.  Attention was good.   Language was appropriate.  Mood was normal without depression.  Speech was not pressured.  Thought content was not tangential.     Lab Results  Component Value Date   WBC 8.4 02/26/2021   HGB 6.9 (LL) 02/26/2021   HCT 20.4 (L) 02/26/2021   PLT 84 (L) 02/26/2021   GLUCOSE 109 (H) 02/26/2021   CHOL 200 (H) 05/31/2020   TRIG 317 (H) 05/31/2020   HDL 55 05/31/2020   LDLCALC 93 05/31/2020   ALT 12 02/17/2021   AST 39 03/08/2021   NA 130 (L) 02/26/2021   K 3.7 02/26/2021   CL  101 02/26/2021   CREATININE 1.56 (H)  02/26/2021   BUN 40 (H) 02/26/2021   CO2 23 02/26/2021    No results found.   No results found.  Pathology:   SURGICAL PATHOLOGY  * THIS IS AN ADDENDUM REPORT *  CASE: [id number]  PATIENT: Valeda Malm  Surgical Pathology Report  *Addendum *   Reason for Addendum #1:  Prognostic Markers   Clinical History: melena, anemia, R/O malignancy (cm)   FINAL MICROSCOPIC DIAGNOSIS:   A. ESOPHAGUS, DISTAL MASS, BIOPSY:  - Poorly differentiated adenocarcinoma with extracellular mucin and  signet ring cells, see comment.   B. STOMACH, MASS, BIOPSY:  - Poorly differentiated adenocarcinoma with extracellular mucin and  signet ring cells, see comment.   COMMENT:   A.  The tumor is subepithelial in the sampled fragments.   B. Dr. Vic Ripper has reviewed the case. Her2 will be ordered. Dr. Watt Climes  was paged on 08/23/2020.   SURGICAL PATHOLOGY  CASE: [id number]  PATIENT: Valeda Malm  Surgical Pathology Report   Clinical History: GE junction cancer ([facility])    FINAL MICROSCOPIC DIAGNOSIS:   A. GE JUNCTION, BIOPSY:  - Esophageal squamous and cardiac mucosa showing nonspecific carditis  with scattered acellular mucin pools and neuroendocrine cell hyperplasia  - Negative for intestinal metaplasia, dysplasia or residual carcinoma in  the submitted material  - See comment   COMMENT:   Immunohistochemical stains for pancytokeratin (AE1/AE3), synaptophysin,  chromogranin and CD56 highlight reactive neuroendocrine cell hyperplasia  but are negative for adenocarcinoma.  Dr. Saralyn Pilar reviewed the case and  concurs with the diagnosis.    PD-L1 0%  Assessment and Plan:  1.  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  Acute blood loss anemia secondary to known cancer 3.  AKI 4.  Thrombocytopenia 5.  Hypertension 6.  Depression  -As previously discussed by Dr. Irene Limbo, the patient is not a candidate for  immunotherapy such as Keytruda due to PD-L1 expression of 0% and has not a candidate for surgery due to mets to the bones and lymph nodes. -Primary oncologist recommended standard treatment with FOLFOX chemotherapy and that response rates may be less than 50%.  The patient tells me today that he is still interested in pursuing chemotherapy.  We will have the patient seen by on-call oncologist later today to discuss timing of chemotherapy.  He was scheduled in our office tomorrow to begin his first cycle of chemotherapy. -I discussed with the patient that I uncertain if chemotherapy would work quickly enough to gain better control of his GI bleeding.   -I did also reach out to radiation oncology to see if additional radiation may be indicated to help control bleeding.  Given how recently he received radiation, he is unlikely a candidate for additional radiation, but PA will check with Dr. Lisbeth Renshaw later this week to see if additional radiation may be indicated. -The patient will keep his appointment with [facility] later this week for a second opinion. -Recommend PRBC transfusion to keep hemoglobin above 8.  We will check ferritin and iron studies with next lab draw.  He may also benefit from IV iron. -Thrombocytopenia likely due to active bleeding.  We will consider platelet transfusion for platelet count less than 20,000 in the setting of active bleeding. -Management of other chronic medical conditions per hospitalist. -Agree with palliative care consult to discuss goals of care.  Thank you for this referral.   Mikey Bussing, DNP, AGPCNP-BC, AOCNP   ADDENDUM: I saw and examined Mr. Arave.  He is  very nice.  His daughter was with him.  I know this is a tough situation.  He is already had maximal radiation therapy that he could get to the primary tumor.  Looks like he does have metastatic disease.  This is not curable.  Our goal clearly is to stop his bleeding.  I think systemic chemotherapy is can be  the way to do this.  He was scheduled to start chemotherapy with FOLFOX.  I am not sure why this was not started and unless he was hospitalized which he certainly could have been.  I think that FOLFOX would be a reasonable option.  I think he would have a decent chance of responding.  Unfortunate, he is not a candidate for immunotherapy.  I am sure that his tumor has been checked for HER2.  He does have a tumor under the left arm.  This certainly looks like a malignant tumor.  I suppose this could always be biopsied and we can see if there is any HER2 associated with the malignancy.  Again, I do not see any reason why we cannot do chemotherapy.  I think he needs inpatient chemotherapy because of the bleeding.  He does need IV iron.  I am sure his iron deficient.  I know he has little bit of thrombocytopenia.  I would not think that this is going to be a problem.  We will try to get treatment started on the 21st or 22nd.  He is amenable to treatment.  He understands that the treatment is not curable.  Again if we get a response, his quality of life will clearly be improved because he should not have bleeding.  I appreciate the opportunity to of seeing him.  He is very nice.  His daughter is very nice.  It was very enjoyable talking to them.Lattie Haw, MD  Jeneen Rinks 1:5-7

## 2021-02-27 ENCOUNTER — Ambulatory Visit: Payer: No Typology Code available for payment source

## 2021-02-27 DIAGNOSIS — C778 Secondary and unspecified malignant neoplasm of lymph nodes of multiple regions: Secondary | ICD-10-CM | POA: Diagnosis not present

## 2021-02-27 DIAGNOSIS — D500 Iron deficiency anemia secondary to blood loss (chronic): Secondary | ICD-10-CM | POA: Diagnosis not present

## 2021-02-27 DIAGNOSIS — C160 Malignant neoplasm of cardia: Secondary | ICD-10-CM | POA: Diagnosis not present

## 2021-02-27 DIAGNOSIS — C7951 Secondary malignant neoplasm of bone: Secondary | ICD-10-CM | POA: Diagnosis not present

## 2021-02-27 LAB — COMPREHENSIVE METABOLIC PANEL
ALT: 12 U/L (ref 0–44)
AST: 41 U/L (ref 15–41)
Albumin: 2.2 g/dL — ABNORMAL LOW (ref 3.5–5.0)
Alkaline Phosphatase: 258 U/L — ABNORMAL HIGH (ref 38–126)
Anion gap: 8 (ref 5–15)
BUN: 48 mg/dL — ABNORMAL HIGH (ref 8–23)
CO2: 21 mmol/L — ABNORMAL LOW (ref 22–32)
Calcium: 7.8 mg/dL — ABNORMAL LOW (ref 8.9–10.3)
Chloride: 102 mmol/L (ref 98–111)
Creatinine, Ser: 1.68 mg/dL — ABNORMAL HIGH (ref 0.61–1.24)
GFR, Estimated: 45 mL/min — ABNORMAL LOW (ref >60)
Glucose, Bld: 109 mg/dL — ABNORMAL HIGH (ref 70–99)
Potassium: 3.8 mmol/L (ref 3.5–5.1)
Sodium: 131 mmol/L — ABNORMAL LOW (ref 135–145)
Total Bilirubin: 0.8 mg/dL (ref 0.3–1.2)
Total Protein: 4.5 g/dL — ABNORMAL LOW (ref 6.5–8.1)

## 2021-02-27 LAB — CBC WITH DIFFERENTIAL/PLATELET
Abs Immature Granulocytes: 0.47 10*3/uL — ABNORMAL HIGH (ref 0.00–0.07)
Basophils Absolute: 0.1 10*3/uL (ref 0.0–0.1)
Basophils Relative: 1 %
Eosinophils Absolute: 0.1 10*3/uL (ref 0.0–0.5)
Eosinophils Relative: 2 %
HCT: 20.6 % — ABNORMAL LOW (ref 39.0–52.0)
Hemoglobin: 6.9 g/dL — CL (ref 13.0–17.0)
Immature Granulocytes: 6 %
Lymphocytes Relative: 7 %
Lymphs Abs: 0.6 10*3/uL — ABNORMAL LOW (ref 0.7–4.0)
MCH: 29.6 pg (ref 26.0–34.0)
MCHC: 33.5 g/dL (ref 30.0–36.0)
MCV: 88.4 fL (ref 80.0–100.0)
Monocytes Absolute: 0.7 10*3/uL (ref 0.1–1.0)
Monocytes Relative: 8 %
Neutro Abs: 6.1 10*3/uL (ref 1.7–7.7)
Neutrophils Relative %: 76 %
Platelets: 69 10*3/uL — ABNORMAL LOW (ref 150–400)
RBC: 2.33 MIL/uL — ABNORMAL LOW (ref 4.22–5.81)
RDW: 17.2 % — ABNORMAL HIGH (ref 11.5–15.5)
WBC: 8 10*3/uL (ref 4.0–10.5)
nRBC: 0.4 % — ABNORMAL HIGH (ref 0.0–0.2)

## 2021-02-27 LAB — IRON AND TIBC
Iron: 73 ug/dL (ref 45–182)
Saturation Ratios: 38 % (ref 17.9–39.5)
TIBC: 194 ug/dL — ABNORMAL LOW (ref 250–450)
UIBC: 121 ug/dL

## 2021-02-27 LAB — PREPARE RBC (CROSSMATCH)

## 2021-02-27 LAB — FERRITIN: Ferritin: 1296 ng/mL — ABNORMAL HIGH (ref 24–336)

## 2021-02-27 LAB — MAGNESIUM: Magnesium: 2.1 mg/dL (ref 1.7–2.4)

## 2021-02-27 MED ORDER — ALPRAZOLAM 0.25 MG PO TABS
0.2500 mg | ORAL_TABLET | Freq: Four times a day (QID) | ORAL | Status: DC | PRN
  Administered 2021-02-27 – 2021-03-02 (×6): 0.25 mg via ORAL
  Filled 2021-02-27 (×7): qty 1

## 2021-02-27 MED ORDER — SODIUM CHLORIDE 0.9% IV SOLUTION: Freq: Once | INTRAVENOUS | Status: AC

## 2021-02-27 MED ORDER — TRAZODONE HCL 50 MG PO TABS
50.0000 mg | ORAL_TABLET | Freq: Every evening | ORAL | Status: DC | PRN
  Administered 2021-02-27 – 2021-02-28 (×2): 50 mg via ORAL
  Filled 2021-02-27 (×2): qty 1

## 2021-02-27 NOTE — Progress Notes (Signed)
Donald Wade is now on 6 E.  The orders have been all written for his chemotherapy.  He is on FOLFOX.  We will try FOLFOX as a means to try to help stop the bleeding secondary to his recurrent GE junction tumor.  All of his labs and it back.  His BUN and creatinine are 48 and 1.68.  Calcium is 7.8 with an albumin of 2.2.  We really have to see what his CBC is.  I will see what his iron studies look like.  He is not having pain.  I am not sure how well  he is eating.  His albumin is not all that great.  He has had no cough or shortness of breath.  He is on the Protonix infusion.  His vital signs show temperature 98.  Pulse 110.  Blood pressure 147/86.  His lungs sound clear bilaterally.  Cardiac exam tachycardic but regular.  Abdomen is soft.  There may be a little bit of distention.  Bowel sounds are active.  There is no guarding or rebound tenderness.  Extremities shows no clubbing, cyanosis or edema.  Donald Wade has metastatic adenocarcinoma the GE junction.  He has recurrent GI bleeding.  He has had maximal radiation to the GE junction tumor.  We will try FOLFOX as a means of trying to get the bleeding to stop.  I think this is very reasonable.  He really has not had full dose chemotherapy to date.  He still has a decent performance status.  Hopefully, he will get started today.  We will see what his iron studies look like.  We will see what his CBC is.  I know he will get fantastic care from all staff up on 6 E.  I appreciate all of their compassion.  Lattie Haw, MD  [place] 14:14

## 2021-02-27 NOTE — Progress Notes (Signed)
PT Cancellation Note  Patient Details Name: Donald Wade MRN: [id number] DOB: 19-Feb-1956   Cancelled Treatment:    Reason Eval/Treat Not Completed: Other (comment), visiting with brother and nephew, states he can barely walk but wants  PT to check back.    Claretha Cooper 02/27/2021, 2:39 PM  [place] Pager [phone] Office [phone]

## 2021-02-27 NOTE — Progress Notes (Signed)
Patient ID: Donald Wade, male   DOB: 1955-10-06, 65 y.o.   MRN: [id number]  PROGRESS NOTE    Donald Wade  XBJ:[id number] DOB: 14-Feb-1956 DOA: 02/27/2021 PCP: Berkley Harvey, NP   Brief Narrative:  65 year old male with history of gastroesophageal junction adenocarcinoma with metastases, hypertension, hyperlipidemia, recent hospitalization from 02/20/2021-02/22/2021 for GI bleeding requiring 3 units packed red cells transfusion status post EGD which had shown diffuse oozing from underlying gastric cancer which was injected with epinephrine and patient was discharged on PPI and Carafate presented with hematemesis.  ED provider discussed with GI/Dr. Therisa Doyne on phone on admission and apparently GI has nothing additional to add at this time since the bleeding is probably from underlying gastric cancer which is not amenable to endoscopic therapy.  Hemoglobin was 5.4.  He was transfused packed red cells.  Oncology and palliative care were consulted.  Assessment & Plan:   Acute blood loss anemia Most likely upper GI bleeding in a patient with known GE junction adenocarcinoma Thrombocytopenia - ED provider discussed with GI/Dr. Therisa Doyne on phone on admission and apparently GI has nothing additional to add at this time since the bleeding is probably from underlying gastric cancer which is not amenable to endoscopic therapy. -Status post 3 units packed red cells transfusion since admission.  Hemoglobin still 6.9 this morning again.  Transfuse 1 more unit of packed red cells. -Monitor H&H.  Continue Protonix and Carafate. -Oncology and palliative care evaluation following.  CODE STATUS has been changed to DNR.  Oncology is planning to start chemotherapy today.  Hypertension -Blood pressure improved.  Metoprolol on hold.  Might have to resume it.  Depression--continue Wellbutrin  Acute kidney injury -Encourage oral intake. -Monitor  Hyponatremia -Possibly from poor oral intake.  Monitor.  DVT  prophylaxis: SCDs Code Status: Full Family Communication: Wife and daughter at bedside on 02/26/2021 Disposition Plan: Status is: Inpatient  Remains inpatient appropriate because:Inpatient level of care appropriate due to severity of illness  Dispo: The patient is from: Home              Anticipated d/c is to: Home in 1 to 2 days with possible hospice once cleared by oncology.              Patient currently is not medically stable to d/c.   Difficult to place patient No  Consultants: Oncology/palliative care.  ED provider spoke to GI on-call on presentation on phone  Procedures: None  Antimicrobials: None   Subjective: Patient seen and examined at bedside.   No overnight fever or vomiting reported.  Did not sleep well last night. Objective: Vitals:   02/26/21 2259 02/27/21 0246 02/27/21 0702 02/27/21 0811  BP: (!) 154/85 (!) 149/82 (!) 147/86 (!) 141/83  Pulse: (!) 108 (!) 110 (!) 110 (!) 108  Resp: 20 18 18 16   Temp: (!) 97.5 F (36.4 C) 98 F (36.7 C) 98 F (36.7 C) 97.9 F (36.6 C)  TempSrc: Oral Oral Oral Oral  SpO2: 95% 96% 96% 95%  Weight:        Intake/Output Summary (Last 24 hours) at 02/27/2021 0827 Last data filed at 02/27/2021 0600 Gross per 24 hour  Intake 675.85 ml  Output 700 ml  Net -24.15 ml    Filed Weights   02/26/21 1855  Weight: 76.8 kg    Examination:  General exam: No acute distress.  Currently on room air. Respiratory system: Decreased breath sounds at bases bilaterally, some scattered crackles Cardiovascular system: Tachycardic; S1-S2  heard Gastrointestinal system: Abdomen is distended slightly, soft and nontender.  Bowel sounds are heard Extremities: Trace lower extremity edema; no clubbing  Central nervous system: Awake and alert. No focal neurological deficits.  Moves extremities Skin: No obvious petechiae/lesions Psychiatry: Anxious looking  Data Reviewed: I have personally reviewed following labs and imaging  studies  CBC: Recent Labs  Lab 02/20/21 1525 02/21/21 0520 02/21/21 1425 02/22/21 0425 03/06/2021 0449 03/05/2021 2113 02/26/21 0519 02/26/21 1220 02/27/21 0626  WBC 6.9 7.0  --  6.2 8.2  --  8.4  --  8.0  NEUTROABS 5.1  --   --   --   --   --   --   --  6.1  HGB 5.4* 8.5*   < > 7.9* 5.4* 7.5* 6.9* 8.2* 6.9*  HCT 16.0* 24.5*   < > 23.1* 15.9* 22.1* 20.4* 24.0* 20.6*  MCV 97.0 90.7  --  92.4 93.5  --  88.7  --  88.4  PLT 145* 109*  --  93* 86*  --  84*  --  69*   < > = values in this interval not displayed.    Basic Metabolic Panel: Recent Labs  Lab 02/21/21 0520 02/22/21 0425 02/21/2021 0449 02/26/21 0519 02/27/21 0626  NA 134* 135 134* 130* 131*  K 3.6 3.5 3.6 3.7 3.8  CL 102 103 104 101 102  CO2 23 24 23 23  21*  GLUCOSE 97 95 116* 109* 109*  BUN 28* 26* 33* 40* 48*  CREATININE 1.54* 1.33* 1.31* 1.56* 1.68*  CALCIUM 8.1* 7.7* 7.4* 7.3* 7.8*  MG  --   --   --   --  2.1    GFR: Estimated Creatinine Clearance: 48.9 mL/min (A) (by C-G formula based on SCr of 1.68 mg/dL (H)). Liver Function Tests: Recent Labs  Lab 02/20/21 1525 02/21/21 0520 03/05/2021 0449 02/27/21 0626  AST 42* 37 39 41  ALT 11 11 12 12   ALKPHOS 240* 235* 239* 258*  BILITOT 0.3 0.8 0.9 0.8  PROT 6.0* 5.5* 4.5* 4.5*  ALBUMIN 2.9* 2.7* 2.2* 2.2*    No results for input(s): LIPASE, AMYLASE in the last 168 hours. No results for input(s): AMMONIA in the last 168 hours. Coagulation Profile: Recent Labs  Lab 02/20/21 1525 02/13/2021 0449  INR 1.3* 1.5*    Cardiac Enzymes: No results for input(s): CKTOTAL, CKMB, CKMBINDEX, TROPONINI in the last 168 hours. BNP (last 3 results) No results for input(s): PROBNP in the last 8760 hours. HbA1C: No results for input(s): HGBA1C in the last 72 hours. CBG: No results for input(s): GLUCAP in the last 168 hours. Lipid Profile: No results for input(s): CHOL, HDL, LDLCALC, TRIG, CHOLHDL, LDLDIRECT in the last 72 hours. Thyroid Function Tests: No results  for input(s): TSH, T4TOTAL, FREET4, T3FREE, THYROIDAB in the last 72 hours. Anemia Panel: Recent Labs    02/27/21 0626  FERRITIN 1,296*  TIBC 194*  IRON 73   Sepsis Labs: No results for input(s): PROCALCITON, LATICACIDVEN in the last 168 hours.  Recent Results (from the past 240 hour(s))  SARS CORONAVIRUS 2 (TAT 6-24 HRS) Nasopharyngeal Nasopharyngeal Swab     Status: None   Collection Time: 02/20/21  3:47 PM   Specimen: Nasopharyngeal Swab  Result Value Ref Range Status   SARS Coronavirus 2 NEGATIVE NEGATIVE Final    Comment: (NOTE) SARS-CoV-2 target nucleic acids are NOT DETECTED.  The SARS-CoV-2 RNA is generally detectable in upper and lower respiratory specimens during the acute phase of infection. Negative  results do not preclude SARS-CoV-2 infection, do not rule out co-infections with other pathogens, and should not be used as the sole basis for treatment or other patient management decisions. Negative results must be combined with clinical observations, patient history, and epidemiological information. The expected result is Negative.  Fact Sheet for Patients: SugarRoll.be  Fact Sheet for Healthcare Providers: https://www.woods-mathews.com/  This test is not yet approved or cleared by the [place] FDA and  has been authorized for detection and/or diagnosis of SARS-CoV-2 by FDA under an Emergency Use Authorization (EUA). This EUA will remain  in effect (meaning this test can be used) for the duration of the COVID-19 declaration under Se ction 564(b)(1) of the Act, 21 U.S.C. section 360bbb-3(b)(1), unless the authorization is terminated or revoked sooner.  Performed at [facility], [place] [address] [place], [place] [address]   Resp Panel by RT-PCR (Flu A&B, Covid) Nasopharyngeal Swab     Status: None   Collection Time: 02/26/2021  5:29 AM   Specimen: Nasopharyngeal Swab; Nasopharyngeal(NP) swabs in vial transport  medium  Result Value Ref Range Status   SARS Coronavirus 2 by RT PCR NEGATIVE NEGATIVE Final    Comment: (NOTE) SARS-CoV-2 target nucleic acids are NOT DETECTED.  The SARS-CoV-2 RNA is generally detectable in upper respiratory specimens during the acute phase of infection. The lowest concentration of SARS-CoV-2 viral copies this assay can detect is 138 copies/mL. A negative result does not preclude SARS-Cov-2 infection and should not be used as the sole basis for treatment or other patient management decisions. A negative result may occur with  improper specimen collection/handling, submission of specimen other than nasopharyngeal swab, presence of viral mutation(s) within the areas targeted by this assay, and inadequate number of viral copies(<138 copies/mL). A negative result must be combined with clinical observations, patient history, and epidemiological information. The expected result is Negative.  Fact Sheet for Patients:  EntrepreneurPulse.com.au  Fact Sheet for Healthcare Providers:  IncredibleEmployment.be  This test is no t yet approved or cleared by the [place] FDA and  has been authorized for detection and/or diagnosis of SARS-CoV-2 by FDA under an Emergency Use Authorization (EUA). This EUA will remain  in effect (meaning this test can be used) for the duration of the COVID-19 declaration under Section 564(b)(1) of the Act, 21 U.S.C.section 360bbb-3(b)(1), unless the authorization is terminated  or revoked sooner.       Influenza A by PCR NEGATIVE NEGATIVE Final   Influenza B by PCR NEGATIVE NEGATIVE Final    Comment: (NOTE) The Xpert Xpress SARS-CoV-2/FLU/RSV plus assay is intended as an aid in the diagnosis of influenza from Nasopharyngeal swab specimens and should not be used as a sole basis for treatment. Nasal washings and aspirates are unacceptable for Xpert Xpress SARS-CoV-2/FLU/RSV testing.  Fact Sheet for  Patients: EntrepreneurPulse.com.au  Fact Sheet for Healthcare Providers: IncredibleEmployment.be  This test is not yet approved or cleared by the [place] FDA and has been authorized for detection and/or diagnosis of SARS-CoV-2 by FDA under an Emergency Use Authorization (EUA). This EUA will remain in effect (meaning this test can be used) for the duration of the COVID-19 declaration under Section 564(b)(1) of the Act, 21 U.S.C. section 360bbb-3(b)(1), unless the authorization is terminated or revoked.  Performed at [facility], [place] [address], [place], [place] [address]           Radiology Studies: No results found.      Scheduled Meds:  sodium chloride  Intravenous Once   sodium chloride   Intravenous Once   buPROPion  150 mg Oral Daily   Chlorhexidine Gluconate Cloth  6 each Topical Daily   [START ON 02/28/2021] pantoprazole  40 mg Intravenous Q12H   senna-docusate  1 tablet Oral BID   sodium chloride flush  10-40 mL Intracatheter Q12H   sucralfate  1 g Oral TID WC & HS   Continuous Infusions:  pantoprazole 8 mg/hr (02/27/21 0238)          Aline August, MD [facility] 02/27/2021, 8:27 AM

## 2021-02-27 NOTE — Progress Notes (Signed)
Daily Progress Note   Patient Name: Donald Wade       Date: 02/27/2021 DOB: [birth year]  Age: 65 y.o. MRN#: [id number] Attending Physician: Aline August, MD Primary Care Physician: Berkley Harvey, NP Admit Date: 03/06/2021  Reason for Consultation/Follow-up: Establishing goals of care  Subjective: Chart Reviewed. Updates Received. Patient Assessed.   Patient awake, alert and oriented. Complains of some back discomfort. Shares he did not get much rest last night  due to anxiety and "just couldn't get comfortable". States he is ready to go home!   Wife and daughter is at the bedside. We reviewed previously discussed goals of care discussions. He shares recent discussions with Dr. Marin Olp and plan of care.   Tracey and his family states they would like to proceed with FOLFOX treatment while hospitalized with hopes this would help control or even stop his bleeding. He shares concerns of his quality of life during previous treatments and how this will affect him. He is a Armed forces operational officer and states he is hopeful he will have the strength to get home and "tie up some loose ends" before he is unable to do so.   We discussed options of going home with outpatient Palliative support and initiating treatment to give him every opportunity to continue to thrive and somewhat improve his quality of life. He verbalizes agreement however, both he and family confirms their wishes to discharge home with hospice support. Bert expresses his intentions are to receive initial treatment and then would like to go home and be amongst family and friends with what time he has left with hospice support focusing on his comfort and symptom management.   We discussed at length, hospice's goals and philosophy of care. He understands he would no longer receive frequent blood transfusions or chemotherapy treatment. He and family acknowledges this and states they have discussed amongst themselves.   Family is requesting  hospice and equipment needs.   Given plans for chemotherapy, it is appropriate to continue discussions with Dr. Marin Olp to have a clear understanding and expectations regarding treatment options and how this aligns with their wishes. I am not certain undergoing one cycle of chemotherapy would be an ideal situation, however would defer further conversations and recommendations to the Oncologist.   Family verbalized appreciation of continued support. They are aware once final decisions have been made and plan is clear the appropriate referrals and treatments will take place.   All questions answered and support provided.    Length of Stay: 2 days  Vital Signs: BP (!) 145/85   Pulse (!) 110   Temp 98.1 F (36.7 C) (Oral)   Resp 16   Wt 76.8 kg   SpO2 97%   BMI 22.96 kg/m  SpO2: SpO2: 97 % O2 Device: O2 Device: Room Air O2 Flow Rate:    Physical Exam: NAD, thin Decreased bilaterally Tachycardic AAO x3, mood appropriate   Palliative Care Assessment & Plan  HPI: Palliative Care consult requested for goals of care discussion in this 65 y.o. 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 He presented to the ED from home with concerns of hematemesis x3 episodes. Patient recently hospitalized (6/14-6/16)for GI bleed. He underwent upper endoscopy during previous admission which showed oozing from underlying cancer. Epinephrine injection administered. During work-up hemoglobin 5.4 and platelet count 86. Since admission patient is s/p 3 units packed red blood cells. GI per notations has no additional recommendations  or interventions to offer.    Code Status: DNR  Goals of Care/Recommendations: Continue to treat the treatable Plans for ongoing discussions with Oncology as patient is requesting one cycle of FOLFOX with request to then discharge home with hospice  care for symptom management and EOL care. Family is requesting equipment and patient is hopeful he will get to go home by the weekend. I think it will be important to establish clear goals in regards to the effectiveness or ability for one treatment cycle vs continued treatment vs home with hospice and no chemotherapy. Discussed with Dr. Marin Olp who plans to follow up with patient/family.  PMT will continue to support and follow.  Symptom Management: Oxy IR for pain/discomfort Xanax for anxiety  Prognosis: < 6 months  Discharge Planning: To Be Determined  Thank you for allowing the Palliative Medicine Team to assist in the care of this patient.  Time Total: 50 min.   Visit consisted of counseling and education dealing with the complex and emotionally intense issues of symptom management and palliative care in the setting of serious and potentially life-threatening illness.Greater than 50%  of this time was spent counseling and coordinating care related to the above assessment and plan.  Alda Lea, AGPCNP-BC  Palliative Medicine Team [phone]

## 2021-02-28 ENCOUNTER — Ambulatory Visit: Payer: No Typology Code available for payment source | Admitting: Physician Assistant

## 2021-02-28 ENCOUNTER — Other Ambulatory Visit: Payer: No Typology Code available for payment source

## 2021-02-28 LAB — CBC WITH DIFFERENTIAL/PLATELET
Abs Immature Granulocytes: 0.42 10*3/uL — ABNORMAL HIGH (ref 0.00–0.07)
Abs Immature Granulocytes: 0.5 10*3/uL — ABNORMAL HIGH (ref 0.00–0.07)
Basophils Absolute: 0.1 10*3/uL (ref 0.0–0.1)
Basophils Absolute: 0 10*3/uL (ref 0.0–0.1)
Basophils Relative: 1 %
Basophils Relative: 1 %
Eosinophils Absolute: 0.1 10*3/uL (ref 0.0–0.5)
Eosinophils Absolute: 0.1 10*3/uL (ref 0.0–0.5)
Eosinophils Relative: 2 %
Eosinophils Relative: 1 %
HCT: 20.7 % — ABNORMAL LOW (ref 39.0–52.0)
HCT: 19.6 % — ABNORMAL LOW (ref 39.0–52.0)
Hemoglobin: 7 g/dL — ABNORMAL LOW (ref 13.0–17.0)
Hemoglobin: 6.6 g/dL — CL (ref 13.0–17.0)
Immature Granulocytes: 5 %
Immature Granulocytes: 6 %
Lymphocytes Relative: 9 %
Lymphocytes Relative: 7 %
Lymphs Abs: 0.8 10*3/uL (ref 0.7–4.0)
Lymphs Abs: 0.5 10*3/uL — ABNORMAL LOW (ref 0.7–4.0)
MCH: 30.3 pg (ref 26.0–34.0)
MCH: 30.1 pg (ref 26.0–34.0)
MCHC: 33.8 g/dL (ref 30.0–36.0)
MCHC: 33.7 g/dL (ref 30.0–36.0)
MCV: 89.6 fL (ref 80.0–100.0)
MCV: 89.5 fL (ref 80.0–100.0)
Monocytes Absolute: 0.7 10*3/uL (ref 0.1–1.0)
Monocytes Absolute: 0.7 10*3/uL (ref 0.1–1.0)
Monocytes Relative: 9 %
Monocytes Relative: 9 %
Neutro Abs: 6.2 10*3/uL (ref 1.7–7.7)
Neutro Abs: 6.3 10*3/uL (ref 1.7–7.7)
Neutrophils Relative %: 74 %
Neutrophils Relative %: 76 %
Platelets: 62 10*3/uL — ABNORMAL LOW (ref 150–400)
Platelets: 62 10*3/uL — ABNORMAL LOW (ref 150–400)
RBC: 2.31 MIL/uL — ABNORMAL LOW (ref 4.22–5.81)
RBC: 2.19 MIL/uL — ABNORMAL LOW (ref 4.22–5.81)
RDW: 17.3 % — ABNORMAL HIGH (ref 11.5–15.5)
RDW: 17.2 % — ABNORMAL HIGH (ref 11.5–15.5)
WBC: 8.3 10*3/uL (ref 4.0–10.5)
WBC: 8.2 10*3/uL (ref 4.0–10.5)
nRBC: 1 % — ABNORMAL HIGH (ref 0.0–0.2)
nRBC: 0.9 % — ABNORMAL HIGH (ref 0.0–0.2)

## 2021-02-28 LAB — COMPREHENSIVE METABOLIC PANEL
ALT: 11 U/L (ref 0–44)
AST: 46 U/L — ABNORMAL HIGH (ref 15–41)
Albumin: 2.2 g/dL — ABNORMAL LOW (ref 3.5–5.0)
Alkaline Phosphatase: 281 U/L — ABNORMAL HIGH (ref 38–126)
Anion gap: 8 (ref 5–15)
BUN: 59 mg/dL — ABNORMAL HIGH (ref 8–23)
CO2: 21 mmol/L — ABNORMAL LOW (ref 22–32)
Calcium: 7.5 mg/dL — ABNORMAL LOW (ref 8.9–10.3)
Chloride: 102 mmol/L (ref 98–111)
Creatinine, Ser: 1.98 mg/dL — ABNORMAL HIGH (ref 0.61–1.24)
GFR, Estimated: 37 mL/min — ABNORMAL LOW (ref >60)
Glucose, Bld: 118 mg/dL — ABNORMAL HIGH (ref 70–99)
Potassium: 4.2 mmol/L (ref 3.5–5.1)
Sodium: 131 mmol/L — ABNORMAL LOW (ref 135–145)
Total Bilirubin: 1.2 mg/dL (ref 0.3–1.2)
Total Protein: 4.4 g/dL — ABNORMAL LOW (ref 6.5–8.1)

## 2021-02-28 LAB — BASIC METABOLIC PANEL
Anion gap: 8 (ref 5–15)
BUN: 58 mg/dL — ABNORMAL HIGH (ref 8–23)
CO2: 21 mmol/L — ABNORMAL LOW (ref 22–32)
Calcium: 7.4 mg/dL — ABNORMAL LOW (ref 8.9–10.3)
Chloride: 102 mmol/L (ref 98–111)
Creatinine, Ser: 1.98 mg/dL — ABNORMAL HIGH (ref 0.61–1.24)
GFR, Estimated: 37 mL/min — ABNORMAL LOW (ref >60)
Glucose, Bld: 120 mg/dL — ABNORMAL HIGH (ref 70–99)
Potassium: 4 mmol/L (ref 3.5–5.1)
Sodium: 131 mmol/L — ABNORMAL LOW (ref 135–145)

## 2021-02-28 LAB — MAGNESIUM: Magnesium: 2.2 mg/dL (ref 1.7–2.4)

## 2021-02-28 LAB — PREALBUMIN: Prealbumin: 8.4 mg/dL — ABNORMAL LOW (ref 18–38)

## 2021-02-28 LAB — PREPARE RBC (CROSSMATCH)

## 2021-02-28 MED ORDER — SODIUM CHLORIDE 0.9% IV SOLUTION: Freq: Once | INTRAVENOUS | Status: AC

## 2021-02-28 MED ORDER — PANTOPRAZOLE SODIUM 40 MG IV SOLR
40.0000 mg | Freq: Two times a day (BID) | INTRAVENOUS | Status: DC
  Administered 2021-02-28 – 2021-03-02 (×6): 40 mg via INTRAVENOUS
  Filled 2021-02-28 (×4): qty 40

## 2021-02-28 NOTE — Progress Notes (Signed)
Pt's HR is 119, BP 121/82. He is in yellow MEWS. Clarene Essex MD notified. will monitor him.

## 2021-02-28 NOTE — Progress Notes (Signed)
   02/28/21 1204  Assess: MEWS Score  Temp 97.8 F (36.6 C)  BP 129/74  Pulse Rate (!) 118  Resp 15  SpO2 94 %  O2 Device Room Air  Patient Activity (if Appropriate) In bed  Assess: MEWS Score  MEWS Temp 0  MEWS Systolic 0  MEWS Pulse 2  MEWS RR 0  MEWS LOC 0  MEWS Score 2  MEWS Score Color Yellow  Assess: if the MEWS score is Yellow or Red  Were vital signs taken at a resting state? Yes  Focused Assessment No change from prior assessment  Does the patient meet 2 or more of the SIRS criteria? No  MEWS guidelines implemented *See Row Information* No, previously yellow, continue vital signs every 4 hours  Treat  MEWS Interventions Administered scheduled meds/treatments  Pain Scale 0-10  Pain Score 0  Take Vital Signs  Increase Vital Sign Frequency  Yellow: Q 2hr X 2 then Q 4hr X 2, if remains yellow, continue Q 4hrs  Escalate  MEWS: Escalate Yellow: discuss with charge nurse/RN and consider discussing with provider and RRT  Notify: Charge Nurse/RN  Name of Charge Nurse/RN Notified Cj Edgell RN  Date Charge Nurse/RN Notified 02/28/21  Time Charge Nurse/RN Notified 32  Document  Patient Outcome Stabilized after interventions  Progress note created (see row info) Yes  Assess: SIRS CRITERIA  SIRS Temperature  0  SIRS Pulse 1  SIRS Respirations  0  SIRS WBC 0  SIRS Score Sum  1   MEWS Guidelines - (patients age 4 and over)  Pt remains tachycardic, continuing to trigger yellow MEWS protocol. Assessment remains unchanged. Will continue to monitor.

## 2021-02-28 NOTE — Progress Notes (Signed)
   02/28/21 1703  Assess: MEWS Score  Temp 97.8 F (36.6 C)  BP 137/86  Pulse Rate (!) 117  Resp 16  SpO2 94 %  O2 Device Room Air  Assess: MEWS Score  MEWS Temp 0  MEWS Systolic 0  MEWS Pulse 2  MEWS RR 0  MEWS LOC 0  MEWS Score 2  MEWS Score Color Yellow  Assess: if the MEWS score is Yellow or Red  Were vital signs taken at a resting state? Yes  Focused Assessment No change from prior assessment  Does the patient meet 2 or more of the SIRS criteria? No  MEWS guidelines implemented *See Row Information* No, previously yellow, continue vital signs every 4 hours  Treat  MEWS Interventions Administered scheduled meds/treatments  Pain Scale 0-10  Pain Score 0  Take Vital Signs  Increase Vital Sign Frequency  Yellow: Q 2hr X 2 then Q 4hr X 2, if remains yellow, continue Q 4hrs  Escalate  MEWS: Escalate Yellow: discuss with charge nurse/RN and consider discussing with provider and RRT  Notify: Charge Nurse/RN  Name of Charge Nurse/RN Notified Tiffnay Bossi RN  Date Charge Nurse/RN Notified 02/28/21  Time Charge Nurse/RN Notified 1705  Document  Patient Outcome Stabilized after interventions  Progress note created (see row info) Yes  Assess: SIRS CRITERIA  SIRS Temperature  0  SIRS Pulse 1  SIRS Respirations  0  SIRS WBC 0  SIRS Score Sum  1  MEWS Guidelines - (patients age 106 and over)  Pt remains tachycardic, triggering yellow MEWS protocol. Assessment remains unchanged. Will continue to monitor.

## 2021-02-28 NOTE — Progress Notes (Signed)
Daily Progress Note   Patient Name: Donald Wade       Date: 02/28/2021 DOB: 1956-04-04  Age: 65 y.o. MRN#: [id number] Attending Physician: Oswald Hillock, MD Primary Care Physician: Berkley Harvey, NP Admit Date: 03/04/2021  Reason for Consultation/Follow-up: Establishing goals of care  Subjective: Chart Reviewed. Updates Received. Patient Assessed.   Patient awake, alert and oriented. Is having quite a bit of nausea this morning. Observed several episodes of hematemesis. He states this actually makes him feels better.   Wife is at the bedside. Patient and family are having a hard time with complex decision making and requested support.   Patient seen by Dr. Marin Olp this morning with plans to delay any possible chemo treatments to allow him time to make the best decision for him. I offered supportive listening to both patient and his wife. 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 He states he is weak and recalls his tolerance to previous treatments. Wife is tearful expressing her concerns and not wishes to watch him go through such discomfort again.   [name] shares he does not feel he would tolerate treatment but he would be at peace knowing he at least tried, but again noting his fear of rapid decline due to treatment.   Emotional support provided and patient encouraged to make decisions focused on his quality of life and wishes.   I discussed at length current labwork (specifically hemoglobin and prealbumin) providing education on their indications, disease trajectory, and prognostic state. He and wife verbalized understanding and appreciation.   We also discussed plan of care (best case and worst case scenario) with understanding he would not receive much benefit by undergoing 1 cycle of chemotherapy  and if his goal is not to continue then recommendations to consider not proceeding with initial dose. He verbalized understanding.   Dr. Darrick Meigs also at the bedside providing updates. I spoke personally at length with Dr. Marin Olp regarding plan of care, patient's expressed wishes, and concerns of his ability to tolerate treatment given goal of getting home to finalize his estate.   [name] and his wife updated at length regarding plan of care with understanding he will not proceed with chemotherapy today, focus on gaining control of his nausea, anxiety, emphasis on relaxation and comfort, in addition to receiving blood transfusion in the setting of new results of 6.6 despite multiple transfusions over the past 24-48 hours.   Patient and wife verbalized understanding and appreciation of continued support. His sister and brother are flying in from out of state to spend some time with him. I feel this is reasonable given his high risk of sudden death and decline.   All questions answered and support provided.    Length of Stay: 3 days  Vital Signs: BP 139/78 (BP Location: Left Arm)   Pulse (!) 112   Temp (!) 97.5 F (36.4 C)   Resp 16   Wt 76.8 kg   SpO2 93%   BMI 22.96 kg/m  SpO2: SpO2: 93 % O2 Device: O2 Device: Room Air O2 Flow Rate:    Physical Exam: NAD, thin Decreased bilaterally Tachycardic Nauseated (hematemesis) AAO x3, anxious   Palliative Care Assessment & Plan  HPI: Palliative Care consult requested for goals of care discussion in this 65 y.o. 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 He presented to the ED from home with concerns of hematemesis x3 episodes. Patient recently hospitalized (6/14-6/16)for GI bleed. He underwent upper endoscopy during previous admission which showed oozing from underlying cancer. Epinephrine injection  administered. During work-up hemoglobin 5.4 and platelet count 86. Since admission patient is s/p 3 units packed red blood cells. GI per notations has no additional recommendations or interventions to offer.    Code Status: DNR  Goals of Care/Recommendations: Zofran for nausea Xanax for anxiety Patient is having ongoing discussions with family and medical team regarding complex decision making. Discussed at length and education provided on best case worst case scenario, focusing on patients wishes, and quality of life. After discussions with Dr. Marin Olp and medical team chemo treatments held to allow for focus on patient's comfort.  Prealbumin 8.8 (18 in April 2022).  Patient most likely will benefit from home with hospice allowing him the opportunity to finalize his estate with awareness of poor prognosis and high risk of sudden death.  PMT will continue to support and follow.   Symptom Management: Oxy IR for pain/discomfort Xanax for anxiety Zofran for nausea   Prognosis: Weeks  Discharge Planning: To Be Determined  Thank you for allowing the Palliative Medicine Team to assist in the care of this patient.  Time Total: 70 min.   Visit consisted of counseling and education dealing with the complex and emotionally intense issues of symptom management and palliative care in the setting of serious and potentially life-threatening illness.Greater than 50%  of this time was spent counseling and coordinating care related to the above assessment and plan.  Alda Lea, AGPCNP-BC  Palliative Medicine Team [phone]

## 2021-02-28 NOTE — Progress Notes (Signed)
Pt tachycardic, triggering yellow MEWS protocol. No change since previous assessment. Pt in no acute distress, sleeping and resting well. Will continue to monitor.   02/28/21 0735  Assess: MEWS Score  Temp (!) 97.5 F (36.4 C)  BP 139/78  Pulse Rate (!) 112  Resp 16  Level of Consciousness Alert  SpO2 93 %  O2 Device Room Air  Assess: MEWS Score  MEWS Temp 0  MEWS Systolic 0  MEWS Pulse 2  MEWS RR 0  MEWS LOC 0  MEWS Score 2  MEWS Score Color Yellow  Assess: if the MEWS score is Yellow or Red  Were vital signs taken at a resting state? Yes  Focused Assessment No change from prior assessment  Does the patient meet 2 or more of the SIRS criteria? No  MEWS guidelines implemented *See Row Information* No, previously yellow, continue vital signs every 4 hours  Treat  MEWS Interventions Administered scheduled meds/treatments  Pain Score Asleep  Multiple Pain Sites No  Take Vital Signs  Increase Vital Sign Frequency  Yellow: Q 2hr X 2 then Q 4hr X 2, if remains yellow, continue Q 4hrs  Escalate  MEWS: Escalate Yellow: discuss with charge nurse/RN and consider discussing with provider and RRT  Notify: Charge Nurse/RN  Name of Charge Nurse/RN Notified Faren Florence RN  Date Charge Nurse/RN Notified 02/28/21  Time Charge Nurse/RN Notified [phone]  Document  Patient Outcome Stabilized after interventions  Progress note created (see row info) Yes  Assess: SIRS CRITERIA  SIRS Temperature  0  SIRS Pulse 1  SIRS Respirations  0  SIRS WBC 0  SIRS Score Sum  1  MEWS Guidelines - (patients age 12 and over)

## 2021-02-28 NOTE — Progress Notes (Signed)
We received a verbal referral from the nursing director who requested we support the patient and family.  I provided reflective listening as Donald Wade shared about thoughts and feelings related to EOL.  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  He knows that more treatment might give him a little more time, but he does not think that more treatment will be able to give him the quality of life that he wants for himself.  He does have some hope and believes in miracles.  He is also tired of fighting.  I provided prayer and ministry of presence.  After speaking with him, I also called his wife, [name], to offer support.  She stated that she is fine for now and did not wish to speak further at this time.   [place], Bcc Pager, [phone] 4:10 PM

## 2021-02-28 NOTE — Progress Notes (Addendum)
Apparently, Donald Wade may be having a change of opinion as to whether or not he wants treatment.  He said that he wanted hospice.  I got a call from the hospice nurse.  She talked to him yesterday.  He said that he would take 1 treatment and then when he goes home he would want hospice.  I told him that 1 treatment is not going to stop the bleeding.  I said that if he were to do chemotherapy, that we will need probably 3 or 4 treatments before we would know whether is working.  He is not sure that he strong enough for treatment.  He says he not able to eat.  He says he is full all the time.  I am sure he is still bleeding.  He says he feels as if his stomach is "full of blood."  I talked him at length about what is going on.  I think that a prealbumin concerning help in this kind of situation.  I see no lab work back for a CBC today.  He got transfused yesterday.  His metabolic panel shows a BUN of 58 creatinine 1.98.  His calcium is 7.4.  Yesterday, the albumin was 2.2.  This is a very tough decision for him.  I know that he is struggling with this.  I told him that I thought that he would be able to tolerate treatment.  We would have to do a dosage reduction.  We will have to see what the prealbumin is.  This is always very prognostic with respect to cancer patients.  His vital signs show temperature 97.6.  Pulse 119.  Blood pressure 121/82.  Oxygen saturation is 93%.  His lungs are clear.  Cardiac exam is tachycardic but regular.  Abdomen is somewhat distended.  He has a little bit of tenderness to palpation in the epigastric area.  Bowel sounds are present.  Extremity shows no clubbing, cyanosis or edema.  For right now, I think we should just give Donald Wade today to think about what he would like.  It will be interesting to see what his CBC is.  I know that he is somewhat frustrated by the bleeding.  I told him that the bleeding probably is not going to stop unless we try to  treat this with chemotherapy.  He understands this.  Again, he just is not sure he is strong enough to take any treatment.  If he declines treatment, then I would clearly get Hospice involved.  The prealbumin will help was also.  I know that the staff on [address] are doing a great job with him.  Lattie Haw, MD  Donald Wade 1:5-7

## 2021-02-28 NOTE — Progress Notes (Signed)
Date and time results received: 02/28/21 0938   Test: hgb   Critical Value: 6.6 mg/dl  Name of Provider Notified: [place]  Orders Received? Or Actions Taken?: none

## 2021-02-28 NOTE — Progress Notes (Signed)
   02/28/21 1005  Assess: MEWS Score  Temp 97.6 F (36.4 C)  BP 132/72  Pulse Rate (!) 116  Resp 16  SpO2 92 %  O2 Device Room Air  Assess: MEWS Score  MEWS Temp 0  MEWS Systolic 0  MEWS Pulse 2  MEWS RR 0  MEWS LOC 0  MEWS Score 2  MEWS Score Color Yellow  Assess: if the MEWS score is Yellow or Red  Were vital signs taken at a resting state? Yes  Focused Assessment No change from prior assessment  Does the patient meet 2 or more of the SIRS criteria? No  MEWS guidelines implemented *See Row Information* No, previously yellow, continue vital signs every 4 hours  Treat  MEWS Interventions Administered scheduled meds/treatments  Pain Scale 0-10  Pain Score 0  Multiple Pain Sites No  Take Vital Signs  Increase Vital Sign Frequency  Yellow: Q 2hr X 2 then Q 4hr X 2, if remains yellow, continue Q 4hrs  Escalate  MEWS: Escalate Yellow: discuss with charge nurse/RN and consider discussing with provider and RRT  Notify: Charge Nurse/RN  Name of Charge Nurse/RN Notified Firefighter  Date Charge Nurse/RN Notified 02/28/21  Time Charge Nurse/RN Notified 1005  Document  Patient Outcome Stabilized after interventions  Progress note created (see row info) Yes  Assess: SIRS CRITERIA  SIRS Temperature  0  SIRS Pulse 1  SIRS Respirations  0  SIRS WBC 0  SIRS Score Sum  1  MEWS Guidelines - (patients age 20 and over)  Pt remains tachycardic, continuing to trigger yellow MEWS protocol. Assessment remains unchanged. Pt in no acute distress. No c/o pain. Will continue to monitor.

## 2021-02-28 NOTE — Progress Notes (Signed)
PT Cancellation Note  Patient Details Name: RITESH OPARA MRN: [id number] DOB: [birth year]   Cancelled Treatment:    Reason Eval/Treat Not Completed: Medical issues which prohibited therapy, patient  reports bad day, vomiting. Family in room seemed surprised PT is consulted. Patient may not be a candidate for acute PT given medical issues and possible to DC with Hospice.will check back tomorrow.   Claretha Cooper 02/28/2021, 11:02 AM  [place] Pager [phone] Office [phone]

## 2021-02-28 NOTE — Progress Notes (Addendum)
[facility]  PROGRESS NOTE  Donald Wade PZW:[id number] DOB: [birth year] DOA: 02/13/2021 PCP: Berkley Harvey, NP   Brief HPI:   65 year old male with history of gastroesophageal junction adenocarcinoma with metastasis, hypertension, hyperlipidemia, recent hospitalization from 02/20/2021 to 02/22/2021 for GI bleed requiring 3 units PRBC transfusion, s/p EGD which showed diffuse oozing from underlying gastric cancer which was injected with epinephrine and patient was discharged on PPI and Carafate, now presented with hematemesis.  ED physician discussed with GI Dr. Therisa Doyne on phone, and apparently GI had nothing additional to add at this time since the bleeding is from underlying gastric cancer which is not amenable to endoscopic therapy.  Hemoglobin was 5.4.  He was transfused PRBC.  Oncology and palliative care were consulted.    Subjective   Patient seen and examined, he has been vomiting blood this morning.  Prealbumin level is 8.4, dropped from 18.0 last month.   Assessment/Plan:     Acute blood loss anemia -Secondary to upper GI bleed in setting of GE junction adenocarcinoma -GI was consulted by ED provider however GI has nothing additional to add since patient is bleeding from gastric cancer which is not amenable to endoscopic therapy -Today hemoglobin is 6.6 -Transfuse 1 unit PRBC -Oncology and palliative care following -CODE STATUS changed to DNR   GE junction adenocarcinoma -Has been having episodes of hematemesis -Continue Zofran as needed -Oncology following -Patient has been adamant on getting 1 cycle of chemotherapy -He has very low prealbumin of 8.4; dropped from 18.0 from last month -Would not be a candidate for chemotherapy -Continue Carafate -Oncology following  Hypertension -Blood pressure is stable -Metoprolol on hold  Depression -Continue Wellbutrin  Hyponatremia -Sodium is 131 -Likely from poor p.o. intake -We will monitor  Goals of  care -Oncology recommends hospice; since he is not a candidate for chemotherapy -Palliative care following -We will follow the recommendations   Scheduled medications:    buPROPion  150 mg Oral Daily   Chlorhexidine Gluconate Cloth  6 each Topical Daily   pantoprazole  40 mg Intravenous Q12H   senna-docusate  1 tablet Oral BID   sodium chloride flush  10-40 mL Intracatheter Q12H   sucralfate  1 g Oral TID WC & HS         Data Reviewed:   CBG:  No results for input(s): GLUCAP in the last 168 hours.  SpO2: 94 %    Vitals:   02/28/21 1204 02/28/21 1224 02/28/21 1225 02/28/21 1703  BP: 129/74 (!) 142/82 (!) 142/82 137/86  Pulse: (!) 118 (!) 116 (!) 116 (!) 117  Resp: 15 17 17 16   Temp: 97.8 F (36.6 C) 97.7 F (36.5 C) 97.7 F (36.5 C) 97.8 F (36.6 C)  TempSrc: Oral Oral  Oral  SpO2: 94% 94%  94%  Weight:         Intake/Output Summary (Last 24 hours) at 02/28/2021 1746 Last data filed at 02/28/2021 1204 Gross per 24 hour  Intake 438.74 ml  Output 300 ml  Net 138.74 ml    06/20 1901 - 06/22 0700 In: 575.9 [P.O.:120; I.V.:140.9] Out: 1000 [Urine:1000]  Filed Weights   02/26/21 1855  Weight: 76.8 kg    CBC:  Recent Labs  Lab 02/18/2021 0449 02/27/2021 2113 02/26/21 0519 02/26/21 1220 02/27/21 0626 02/28/21 0530 02/28/21 0817  WBC 8.2  --  8.4  --  8.0 8.3 8.2  HGB 5.4*   < > 6.9* 8.2* 6.9* 7.0* 6.6*  HCT 15.9*   < >  20.4* 24.0* 20.6* 20.7* 19.6*  PLT 86*  --  84*  --  69* 62* 62*  MCV 93.5  --  88.7  --  88.4 89.6 89.5  MCH 31.8  --  30.0  --  29.6 30.3 30.1  MCHC 34.0  --  33.8  --  33.5 33.8 33.7  RDW 15.3  --  17.2*  --  17.2* 17.3* 17.2*  LYMPHSABS  --   --   --   --  0.6* 0.8 0.5*  MONOABS  --   --   --   --  0.7 0.7 0.7  EOSABS  --   --   --   --  0.1 0.1 0.1  BASOSABS  --   --   --   --  0.1 0.1 0.0   < > = values in this interval not displayed.    Complete metabolic panel:  Recent Labs  Lab 02/13/2021 0449 02/26/21 0519  02/27/21 0626 02/28/21 0530 02/28/21 0817  NA 134* 130* 131* 131* 131*  K 3.6 3.7 3.8 4.0 4.2  CL 104 101 102 102 102  CO2 23 23 21* 21* 21*  GLUCOSE 116* 109* 109* 120* 118*  BUN 33* 40* 48* 58* 59*  CREATININE 1.31* 1.56* 1.68* 1.98* 1.98*  CALCIUM 7.4* 7.3* 7.8* 7.4* 7.5*  AST 39  --  41  --  46*  ALT 12  --  12  --  11  ALKPHOS 239*  --  258*  --  281*  BILITOT 0.9  --  0.8  --  1.2  ALBUMIN 2.2*  --  2.2*  --  2.2*  MG  --   --  2.1 2.2  --   INR 1.5*  --   --   --   --     No results for input(s): LIPASE, AMYLASE in the last 168 hours.  Recent Labs  Lab 02/14/2021 0529  SARSCOV2NAA NEGATIVE    ------------------------------------------------------------------------------------------------------------------ No results for input(s): CHOL, HDL, LDLCALC, TRIG, CHOLHDL, LDLDIRECT in the last 72 hours.  No results found for: HGBA1C ------------------------------------------------------------------------------------------------------------------ No results for input(s): TSH, T4TOTAL, T3FREE, THYROIDAB in the last 72 hours.  Invalid input(s): FREET3 ------------------------------------------------------------------------------------------------------------------ Recent Labs    02/27/21 0626  FERRITIN 1,296*  TIBC 194*  IRON 73    Coagulation profile Recent Labs  Lab 02/22/2021 0449  INR 1.5*   No results for input(s): DDIMER in the last 72 hours.  Cardiac Enzymes No results for input(s): CKTOTAL, CKMB, CKMBINDEX, TROPONINI in the last 168 hours.  ------------------------------------------------------------------------------------------------------------------ No results found for: BNP   Antibiotics: Anti-infectives (From admission, onward)    None        Radiology Reports  No results found.    DVT prophylaxis: SCDs  Code Status: DNR  Family Communication: Discussed with patient wife at bedside  Consultants: Oncology Palliative  care  Procedures:     Objective    Physical Examination:   General-appears in no acute distress Heart-S1-S2, regular, no murmur auscultated Lungs-clear to auscultation bilaterally, no wheezing or crackles auscultated Abdomen-soft, nontender, no organomegaly Extremities-no edema in the lower extremities Neuro-alert, oriented x3, no focal deficit noted  Status is: Inpatient  Dispo: The patient is from: Home              Anticipated d/c is to: Home              Anticipated d/c date is: 03/19/21  Patient currently not stable for discharge  Barrier to discharge-ongoing discussion for goals of care, hematemesis, anemia  COVID-19 Labs  Recent Labs    02/27/21 0626  FERRITIN 1,296*    Lab Results  Component Value Date   SARSCOV2NAA NEGATIVE 02/11/2021   [place] NEGATIVE 02/20/2021   [place] NEGATIVE 08/21/2020   [place] NEGATIVE 11/19/2019    Microbiology  Recent Results (from the past 240 hour(s))  SARS CORONAVIRUS 2 (TAT 6-24 HRS) Nasopharyngeal Nasopharyngeal Swab     Status: None   Collection Time: 02/20/21  3:47 PM   Specimen: Nasopharyngeal Swab  Result Value Ref Range Status   SARS Coronavirus 2 NEGATIVE NEGATIVE Final    Comment: (NOTE) SARS-CoV-2 target nucleic acids are NOT DETECTED.  The SARS-CoV-2 RNA is generally detectable in upper and lower respiratory specimens during the acute phase of infection. Negative results do not preclude SARS-CoV-2 infection, do not rule out co-infections with other pathogens, and should not be used as the sole basis for treatment or other patient management decisions. Negative results must be combined with clinical observations, patient history, and epidemiological information. The expected result is Negative.  Fact Sheet for Patients: SugarRoll.be  Fact Sheet for Healthcare Providers: https://www.woods-mathews.com/  This test is not yet approved  or cleared by the [place] FDA and  has been authorized for detection and/or diagnosis of SARS-CoV-2 by FDA under an Emergency Use Authorization (EUA). This EUA will remain  in effect (meaning this test can be used) for the duration of the COVID-19 declaration under Se ction 564(b)(1) of the Act, 21 U.S.C. section 360bbb-3(b)(1), unless the authorization is terminated or revoked sooner.  Performed at [facility], [place] [address], [place], [place] [address]   Resp Panel by RT-PCR (Flu A&B, Covid) Nasopharyngeal Swab     Status: None   Collection Time: 02/18/2021  5:29 AM   Specimen: Nasopharyngeal Swab; Nasopharyngeal(NP) swabs in vial transport medium  Result Value Ref Range Status   SARS Coronavirus 2 by RT PCR NEGATIVE NEGATIVE Final    Comment: (NOTE) SARS-CoV-2 target nucleic acids are NOT DETECTED.  The SARS-CoV-2 RNA is generally detectable in upper respiratory specimens during the acute phase of infection. The lowest concentration of SARS-CoV-2 viral copies this assay can detect is 138 copies/mL. A negative result does not preclude SARS-Cov-2 infection and should not be used as the sole basis for treatment or other patient management decisions. A negative result may occur with  improper specimen collection/handling, submission of specimen other than nasopharyngeal swab, presence of viral mutation(s) within the areas targeted by this assay, and inadequate number of viral copies(<138 copies/mL). A negative result must be combined with clinical observations, patient history, and epidemiological information. The expected result is Negative.  Fact Sheet for Patients:  EntrepreneurPulse.com.au  Fact Sheet for Healthcare Providers:  IncredibleEmployment.be  This test is no t yet approved or cleared by the [place] FDA and  has been authorized for detection and/or diagnosis of SARS-CoV-2 by FDA under an Emergency Use  Authorization (EUA). This EUA will remain  in effect (meaning this test can be used) for the duration of the COVID-19 declaration under Section 564(b)(1) of the Act, 21 U.S.C.section 360bbb-3(b)(1), unless the authorization is terminated  or revoked sooner.       Influenza A by PCR NEGATIVE NEGATIVE Final   Influenza B by PCR NEGATIVE NEGATIVE Final    Comment: (NOTE) The Xpert Xpress SARS-CoV-2/FLU/RSV plus assay is intended as an aid in the diagnosis of influenza  from Nasopharyngeal swab specimens and should not be used as a sole basis for treatment. Nasal washings and aspirates are unacceptable for Xpert Xpress SARS-CoV-2/FLU/RSV testing.  Fact Sheet for Patients: EntrepreneurPulse.com.au  Fact Sheet for Healthcare Providers: IncredibleEmployment.be  This test is not yet approved or cleared by the [place] FDA and has been authorized for detection and/or diagnosis of SARS-CoV-2 by FDA under an Emergency Use Authorization (EUA). This EUA will remain in effect (meaning this test can be used) for the duration of the COVID-19 declaration under Section 564(b)(1) of the Act, 21 U.S.C. section 360bbb-3(b)(1), unless the authorization is terminated or revoked.  Performed at [facility], [place] [address] [place], [place] [address]              Oswald Hillock   [facility] If 7PM-7AM, please contact night-coverage at [email or web address], Office  [phone]   02/28/2021, 5:46 PM  LOS: 3 days

## 2021-03-01 LAB — TYPE AND SCREEN
ABO/RH(D): O POS
Antibody Screen: NEGATIVE
Unit division: 0
Unit division: 0
Unit division: 0
Unit division: 0
Unit division: 0
Unit division: 0

## 2021-03-01 LAB — BPAM RBC
Blood Product Expiration Date: 202207232359
Blood Product Expiration Date: 202207232359
Blood Product Expiration Date: 202207232359
Blood Product Expiration Date: 202207232359
Blood Product Expiration Date: 202207232359
Blood Product Expiration Date: 202207232359
ISSUE DATE / TIME: 202206190844
ISSUE DATE / TIME: 202206191328
ISSUE DATE / TIME: 202206200634
ISSUE DATE / TIME: 202206210818
ISSUE DATE / TIME: 202206221142
Unit Type and Rh: 5100
Unit Type and Rh: 5100
Unit Type and Rh: 5100
Unit Type and Rh: 5100
Unit Type and Rh: 5100
Unit Type and Rh: 5100

## 2021-03-01 LAB — COMPREHENSIVE METABOLIC PANEL
ALT: 14 U/L (ref 0–44)
AST: 53 U/L — ABNORMAL HIGH (ref 15–41)
Albumin: 2.1 g/dL — ABNORMAL LOW (ref 3.5–5.0)
Alkaline Phosphatase: 298 U/L — ABNORMAL HIGH (ref 38–126)
Anion gap: 12 (ref 5–15)
BUN: 77 mg/dL — ABNORMAL HIGH (ref 8–23)
CO2: 18 mmol/L — ABNORMAL LOW (ref 22–32)
Calcium: 7.2 mg/dL — ABNORMAL LOW (ref 8.9–10.3)
Chloride: 100 mmol/L (ref 98–111)
Creatinine, Ser: 2.43 mg/dL — ABNORMAL HIGH (ref 0.61–1.24)
GFR, Estimated: 29 mL/min — ABNORMAL LOW (ref >60)
Glucose, Bld: 132 mg/dL — ABNORMAL HIGH (ref 70–99)
Potassium: 4.3 mmol/L (ref 3.5–5.1)
Sodium: 130 mmol/L — ABNORMAL LOW (ref 135–145)
Total Bilirubin: 1.4 mg/dL — ABNORMAL HIGH (ref 0.3–1.2)
Total Protein: 4.3 g/dL — ABNORMAL LOW (ref 6.5–8.1)

## 2021-03-01 LAB — CBC WITH DIFFERENTIAL/PLATELET
Abs Immature Granulocytes: 0.66 10*3/uL — ABNORMAL HIGH (ref 0.00–0.07)
Basophils Absolute: 0 10*3/uL (ref 0.0–0.1)
Basophils Relative: 0 %
Eosinophils Absolute: 0.1 10*3/uL (ref 0.0–0.5)
Eosinophils Relative: 1 %
HCT: 19.9 % — ABNORMAL LOW (ref 39.0–52.0)
Hemoglobin: 6.7 g/dL — CL (ref 13.0–17.0)
Immature Granulocytes: 6 %
Lymphocytes Relative: 7 %
Lymphs Abs: 0.8 10*3/uL (ref 0.7–4.0)
MCH: 30.7 pg (ref 26.0–34.0)
MCHC: 33.7 g/dL (ref 30.0–36.0)
MCV: 91.3 fL (ref 80.0–100.0)
Monocytes Absolute: 0.9 10*3/uL (ref 0.1–1.0)
Monocytes Relative: 8 %
Neutro Abs: 9 10*3/uL — ABNORMAL HIGH (ref 1.7–7.7)
Neutrophils Relative %: 78 %
Platelets: 46 10*3/uL — ABNORMAL LOW (ref 150–400)
RBC: 2.18 MIL/uL — ABNORMAL LOW (ref 4.22–5.81)
RDW: 17.1 % — ABNORMAL HIGH (ref 11.5–15.5)
WBC: 11.6 10*3/uL — ABNORMAL HIGH (ref 4.0–10.5)
nRBC: 1.2 % — ABNORMAL HIGH (ref 0.0–0.2)

## 2021-03-01 MED ORDER — POLYVINYL ALCOHOL 1.4 % OP SOLN
1.0000 [drp] | Freq: Four times a day (QID) | OPHTHALMIC | Status: DC | PRN
  Filled 2021-03-01: qty 15

## 2021-03-01 MED ORDER — ONDANSETRON HCL 4 MG/2ML IJ SOLN: 4.0000 mg | Freq: Four times a day (QID) | INTRAMUSCULAR | Status: DC | PRN

## 2021-03-01 MED ORDER — GLYCOPYRROLATE 0.2 MG/ML IJ SOLN: 0.2000 mg | INTRAMUSCULAR | Status: DC | PRN

## 2021-03-01 MED ORDER — HALOPERIDOL LACTATE 5 MG/ML IJ SOLN: 1.0000 mg | Freq: Four times a day (QID) | INTRAMUSCULAR | Status: DC | PRN

## 2021-03-01 MED ORDER — BIOTENE DRY MOUTH MT LIQD: 15.0000 mL | OROMUCOSAL | Status: DC | PRN

## 2021-03-01 MED ORDER — METOCLOPRAMIDE HCL 10 MG PO TABS
20.0000 mg | ORAL_TABLET | Freq: Three times a day (TID) | ORAL | Status: DC
  Administered 2021-03-01 – 2021-03-02 (×4): 20 mg via ORAL
  Filled 2021-03-01 (×4): qty 2

## 2021-03-01 MED ORDER — ONDANSETRON HCL 4 MG/2ML IJ SOLN
4.0000 mg | Freq: Four times a day (QID) | INTRAMUSCULAR | Status: DC
  Administered 2021-03-01 – 2021-03-02 (×5): 4 mg via INTRAVENOUS
  Filled 2021-03-01 (×5): qty 2

## 2021-03-01 MED ORDER — MORPHINE SULFATE (CONCENTRATE) 10 MG/0.5ML PO SOLN: 10.0000 mg | ORAL | Status: DC | PRN

## 2021-03-01 MED ORDER — MORPHINE SULFATE (PF) 2 MG/ML IV SOLN
1.0000 mg | INTRAVENOUS | Status: DC
  Administered 2021-03-01 – 2021-03-02 (×4): 1 mg via INTRAVENOUS
  Filled 2021-03-01 (×5): qty 1

## 2021-03-01 NOTE — Progress Notes (Signed)
Daily Progress Note   Patient Name: Donald Wade       Date: 03/01/2021 DOB: 01/29/1956  Age: 65 y.o. MRN#: [id number] Attending Physician: Oswald Hillock, MD Primary Care Physician: Berkley Harvey, NP Admit Date: 02/18/2021  Reason for Consultation/Follow-up: Establishing goals of care  Subjective: Chart Reviewed. Updates Received. Patient Assessed.   Patient resting in bed. Seems much more weaker today compared to previous days. States he feels horrible today and knows his time is limited. Emotional support provided. He is appreciative of the Chaplain coming by to see him on yesterday.   Donald Wade shares he is at peace with decisions for hospice and knowing he is rapidly facing end-of-life. He states he originally wished to return home with his family however, feels this is not likely given his severe weakness and symptoms. He does not feel he would be comfortable in home setting. He is requesting [facility] consideration and states he has discussed with his wife.   Wife arrived during my visit. Updates provided. Patient spent time expressing his wishes and acceptance of his condition. Wife extremely anxious but supportive.   We discussed continued care versus focusing on comfort. I discussed at length comfort care measures and symptom management. They understand patient would no longer receive aggressive medical interventions such as continuous vital signs, lab work, radiology testing, blood transfusions, or medications not focused on comfort. All care would focus on how the patient is looking and feeling. This would include management of any symptoms that may cause discomfort, pain, shortness of breath, cough, nausea, agitation, anxiety, and/or secretions etc. Symptoms would be managed with medications and other non-pharmacological interventions such as spiritual support if requested, repositioning, music therapy, or therapeutic listening. Patient and wife  verbalized understanding  and appreciation.   Education provided on visitation policy in the setting of comfort.   We discussed residential hospice consideration, including their goals and philosophy of care. Wife is in agreement and confirms wishes for referral. Education provided on referral process.   All questions answered and support provided.   1045: Wife called requesting a call back. Spoke with her at length. She is requesting patient be placed on morphine drip. Education provided on patient's symptom and appropriateness of continued drip. I advised that nursing staff does a great job assessing patient and notifying the medical team if symptoms are not resolved with scheduled or PRN medications. Patient has not required a large amount of pain medication over the past 24-48 hours. He does state he is uncomfortable however, does not appear in any respiratory distress during my assessment. Discussed expectations at end-of-life and assured her staff will closely follow and make adjustments with a goal to minimize any discomfort.   1145: Received message from wife inquiring about [facility] bed status. She is wanting to know if he would be moved to notify family of status. Received notification from [place] [place] ([facility]) bed was available. She later tried to call and speak with patient's wife and daughter who unfortunately no longer agreed with patient being able to transfer.   1210: I spoke at length with patient's daughter, Donald Wade and wife. Updates provided. Allowed them to express their concerns with patient transferring to [facility]. Daughter is anxious expressing they would not be able to live with themselves if he transferred and passed away prior to reaching the facility. She is requesting his hemoglobin be rechecked to see how low it was. Education provided on comfort care measures confirming by performing lab  this would not change situation and if focus is on comfort no interventions would take place. Family  verbalized understanding and confirmed wishes for no labwork and focus solely on his comfort and minimizing any distress or discomfort. Education provided on symptom management medications and expectations at end-of-life. Family verbalized understanding and appreciation.    All questions answered and support provided.    Length of Stay: 4 days  Vital Signs: BP (!) 146/76 (BP Location: Right Arm)   Pulse (!) 106   Temp (!) 97.5 F (36.4 C) (Oral)   Resp 16   Wt 76.8 kg   SpO2 94%   BMI 22.96 kg/m  SpO2: SpO2: 94 % O2 Device: O2 Device: Room Air O2 Flow Rate:    Physical Exam: NAD, thin, frail  Decreased bilaterally Tachycardic AAO x3, somnolent but easily aroused   Palliative Care Assessment & Plan  HPI: Palliative Care consult requested for goals of care discussion in this 64 y.o. 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 He presented to the ED from home with concerns of hematemesis x3 episodes. Patient recently hospitalized (6/14-6/16)for GI bleed. He underwent upper endoscopy during previous admission which showed oozing from underlying cancer. Epinephrine injection administered. During work-up hemoglobin 5.4 and platelet count 86. Since admission patient is s/p 3 units packed red blood cells. GI per notations has no additional recommendations or interventions to offer.    Code Status: DNR  Goals of Care/Recommendations: Transition all care to focus on comfort Family and patient clear in expressed goals for comfort. They originally requested [facility] however after further discussions with their daughter, have decided against and anticipate a hospital death. Family concerned patient will die during transport due to the stress of moving. Education and support provided.  Family requesting morphine drip. Extensive education provided on  appropriateness. They are aware the team will closely monitor and make adjustments as needed.  Morphine PRN and scheduled for pain/air hunger/comfort Robinul PRN for excessive secretions Ativan PRN for agitation/anxiety Zofran scheduled for nausea Liquifilm tears PRN for dry eyes Haldol PRN for agitation/anxiety May have comfort feeding Comfort cart for family Unrestricted visitations in the setting of EOL (per policy) Oxygen PRN 2L or less for comfort. No escalation.   PMT will continue to support and follow.   Prognosis: Days   Discharge Planning: Anticipated Hospital Death  Thank you for allowing the Palliative Medicine Team to assist in the care of this patient.  Time Total: 90 min.   Visit consisted of counseling and education dealing with the complex and emotionally intense issues of symptom management and palliative care in the setting of serious and potentially life-threatening illness.Greater than 50%  of this time was spent counseling and coordinating care related to the above assessment and plan.  Alda Lea, AGPCNP-BC  Palliative Medicine Team [phone]

## 2021-03-01 NOTE — Progress Notes (Signed)
[facility]  PROGRESS NOTE  Donald Wade DPO:[id number] DOB: 1955/11/07 DOA: 02/27/2021 PCP: Berkley Harvey, NP   Brief HPI:   65 year old male with history of gastroesophageal junction adenocarcinoma with metastasis, hypertension, hyperlipidemia, recent hospitalization from 02/20/2021 to 02/22/2021 for GI bleed requiring 3 units PRBC transfusion, s/p EGD which showed diffuse oozing from underlying gastric cancer which was injected with epinephrine and patient was discharged on PPI and Carafate, now presented with hematemesis.  ED physician discussed with GI Dr. Therisa Doyne on phone, and apparently GI had nothing additional to add at this time since the bleeding is from underlying gastric cancer which is not amenable to endoscopic therapy.  Hemoglobin was 5.4.  He was transfused PRBC.  Oncology and palliative care were consulted.    Subjective   Patient seen and examined, oncology is recommending comfort measures only.  Patient is not a candidate for further chemotherapy due to low prealbumin.   Assessment/Plan:     Acute blood loss anemia -Secondary to upper GI bleed in setting of GE junction adenocarcinoma -GI was consulted by ED provider however GI has nothing additional to add since patient is bleeding from gastric cancer which is not amenable to endoscopic therapy -Today hemoglobin is 6.6 -Transfuse 1 unit PRBC -Oncology and palliative care following -CODE STATUS changed to DNR -Patient is now comfort care only.   GE junction adenocarcinoma -Has been having episodes of hematemesis -Continue Zofran as needed -Oncology following -Patient has been adamant on getting 1 cycle of chemotherapy -He has very low prealbumin of 8.4; dropped from 18.0 from last month -Would not be a candidate for chemotherapy -Continue Carafate -Oncology following, patient made comfort care only  Hypertension -Blood pressure is stable -Metoprolol on hold  Depression -Continue  Wellbutrin  Hyponatremia -Sodium is 131 -Likely from poor p.o. intake -We will monitor  Goals of care -Oncology recommends hospice; since he is not a candidate for chemotherapy -Palliative care saw the patient, patient started on morphine as needed -Anticipate hospital death.   Scheduled medications:    buPROPion  150 mg Oral Daily   metoCLOPramide  20 mg Oral TID AC & HS    morphine injection  1 mg Intravenous Q4H   ondansetron (ZOFRAN) IV  4 mg Intravenous Q6H   pantoprazole  40 mg Intravenous Q12H   senna-docusate  1 tablet Oral BID   sodium chloride flush  10-40 mL Intracatheter Q12H   sucralfate  1 g Oral TID WC & HS         Data Reviewed:   CBG:  No results for input(s): GLUCAP in the last 168 hours.  SpO2: 94 %    Vitals:   02/28/21 2041 02/28/21 2159 03/01/21 0152 03/01/21 0448  BP: 139/85 (!) 142/80 (!) 141/78 (!) 146/76  Pulse: (!) 122 (!) 121 (!) 120 (!) 106  Resp: 18 18 16 16   Temp: 98.7 F (37.1 C) 98 F (36.7 C) 97.9 F (36.6 C) (!) 97.5 F (36.4 C)  TempSrc: Oral Oral Oral Oral  SpO2: 97% 93% 93% 94%  Weight:        No intake or output data in the 24 hours ending 03/01/21 1645   06/21 1901 - 06/23 0700 In: 850 [I.V.:438.7] Out: 300 [Urine:300]  Filed Weights   02/26/21 1855  Weight: 76.8 kg    CBC:  Recent Labs  Lab 02/26/21 0519 02/26/21 1220 02/27/21 0626 02/28/21 0530 02/28/21 0817 03/01/21 0442  WBC 8.4  --  8.0 8.3 8.2 11.6*  HGB 6.9* 8.2* 6.9* 7.0* 6.6* 6.7*  HCT 20.4* 24.0* 20.6* 20.7* 19.6* 19.9*  PLT 84*  --  69* 62* 62* 46*  MCV 88.7  --  88.4 89.6 89.5 91.3  MCH 30.0  --  29.6 30.3 30.1 30.7  MCHC 33.8  --  33.5 33.8 33.7 33.7  RDW 17.2*  --  17.2* 17.3* 17.2* 17.1*  LYMPHSABS  --   --  0.6* 0.8 0.5* 0.8  MONOABS  --   --  0.7 0.7 0.7 0.9  EOSABS  --   --  0.1 0.1 0.1 0.1  BASOSABS  --   --  0.1 0.1 0.0 0.0    Complete metabolic panel:  Recent Labs  Lab 03/03/2021 0449 02/26/21 0519 02/27/21 0626  02/28/21 0530 02/28/21 0817 03/01/21 0442  NA 134* 130* 131* 131* 131* 130*  K 3.6 3.7 3.8 4.0 4.2 4.3  CL 104 101 102 102 102 100  CO2 23 23 21* 21* 21* 18*  GLUCOSE 116* 109* 109* 120* 118* 132*  BUN 33* 40* 48* 58* 59* 77*  CREATININE 1.31* 1.56* 1.68* 1.98* 1.98* 2.43*  CALCIUM 7.4* 7.3* 7.8* 7.4* 7.5* 7.2*  AST 39  --  41  --  46* 53*  ALT 12  --  12  --  11 14  ALKPHOS 239*  --  258*  --  281* 298*  BILITOT 0.9  --  0.8  --  1.2 1.4*  ALBUMIN 2.2*  --  2.2*  --  2.2* 2.1*  MG  --   --  2.1 2.2  --   --   INR 1.5*  --   --   --   --   --     No results for input(s): LIPASE, AMYLASE in the last 168 hours.  Recent Labs  Lab 02/17/2021 0529  SARSCOV2NAA NEGATIVE    ------------------------------------------------------------------------------------------------------------------ No results for input(s): CHOL, HDL, LDLCALC, TRIG, CHOLHDL, LDLDIRECT in the last 72 hours.  No results found for: HGBA1C ------------------------------------------------------------------------------------------------------------------ No results for input(s): TSH, T4TOTAL, T3FREE, THYROIDAB in the last 72 hours.  Invalid input(s): FREET3 ------------------------------------------------------------------------------------------------------------------ Recent Labs    02/27/21 0626  FERRITIN 1,296*  TIBC 194*  IRON 73    Coagulation profile Recent Labs  Lab 02/23/2021 0449  INR 1.5*   No results for input(s): DDIMER in the last 72 hours.  Cardiac Enzymes No results for input(s): CKTOTAL, CKMB, CKMBINDEX, TROPONINI in the last 168 hours.  ------------------------------------------------------------------------------------------------------------------ No results found for: BNP   Antibiotics: Anti-infectives (From admission, onward)    None        Radiology Reports  No results found.    DVT prophylaxis: SCDs  Code Status: DNR  Family Communication: Discussed with  patient wife at bedside  Consultants: Oncology Palliative care  Procedures:     Objective    Physical Examination:   General-appears in no acute distress Heart-S1-S2, regular, no murmur auscultated Lungs-clear to auscultation bilaterally, no wheezing or crackles auscultated Abdomen-soft, nontender, no organomegaly Extremities-no edema in the lower extremities Neuro-alert, oriented x3, no focal deficit noted  Status is: Inpatient  Dispo: The patient is from: Home              Anticipated d/c is to: Home              Anticipated d/c date is: 2021-03-08              Patient currently not stable for discharge  Barrier to discharge-ongoing discussion for goals  of care, hematemesis, anemia  COVID-19 Labs  Recent Labs    02/27/21 0626  FERRITIN 1,296*    Lab Results  Component Value Date   SARSCOV2NAA NEGATIVE 02/12/2021   [place] NEGATIVE 02/20/2021   [place] NEGATIVE 08/21/2020   [place] NEGATIVE 11/19/2019    Microbiology  Recent Results (from the past 240 hour(s))  SARS CORONAVIRUS 2 (TAT 6-24 HRS) Nasopharyngeal Nasopharyngeal Swab     Status: None   Collection Time: 02/20/21  3:47 PM   Specimen: Nasopharyngeal Swab  Result Value Ref Range Status   SARS Coronavirus 2 NEGATIVE NEGATIVE Final    Comment: (NOTE) SARS-CoV-2 target nucleic acids are NOT DETECTED.  The SARS-CoV-2 RNA is generally detectable in upper and lower respiratory specimens during the acute phase of infection. Negative results do not preclude SARS-CoV-2 infection, do not rule out co-infections with other pathogens, and should not be used as the sole basis for treatment or other patient management decisions. Negative results must be combined with clinical observations, patient history, and epidemiological information. The expected result is Negative.  Fact Sheet for Patients: SugarRoll.be  Fact Sheet for Healthcare  Providers: https://www.woods-mathews.com/  This test is not yet approved or cleared by the [place] FDA and  has been authorized for detection and/or diagnosis of SARS-CoV-2 by FDA under an Emergency Use Authorization (EUA). This EUA will remain  in effect (meaning this test can be used) for the duration of the COVID-19 declaration under Se ction 564(b)(1) of the Act, 21 U.S.C. section 360bbb-3(b)(1), unless the authorization is terminated or revoked sooner.  Performed at [facility], [place] [address] [place], [place] [address]   Resp Panel by RT-PCR (Flu A&B, Covid) Nasopharyngeal Swab     Status: None   Collection Time: 02/15/2021  5:29 AM   Specimen: Nasopharyngeal Swab; Nasopharyngeal(NP) swabs in vial transport medium  Result Value Ref Range Status   SARS Coronavirus 2 by RT PCR NEGATIVE NEGATIVE Final    Comment: (NOTE) SARS-CoV-2 target nucleic acids are NOT DETECTED.  The SARS-CoV-2 RNA is generally detectable in upper respiratory specimens during the acute phase of infection. The lowest concentration of SARS-CoV-2 viral copies this assay can detect is 138 copies/mL. A negative result does not preclude SARS-Cov-2 infection and should not be used as the sole basis for treatment or other patient management decisions. A negative result may occur with  improper specimen collection/handling, submission of specimen other than nasopharyngeal swab, presence of viral mutation(s) within the areas targeted by this assay, and inadequate number of viral copies(<138 copies/mL). A negative result must be combined with clinical observations, patient history, and epidemiological information. The expected result is Negative.  Fact Sheet for Patients:  EntrepreneurPulse.com.au  Fact Sheet for Healthcare Providers:  IncredibleEmployment.be  This test is no t yet approved or cleared by the [place] FDA and  has been authorized  for detection and/or diagnosis of SARS-CoV-2 by FDA under an Emergency Use Authorization (EUA). This EUA will remain  in effect (meaning this test can be used) for the duration of the COVID-19 declaration under Section 564(b)(1) of the Act, 21 U.S.C.section 360bbb-3(b)(1), unless the authorization is terminated  or revoked sooner.       Influenza A by PCR NEGATIVE NEGATIVE Final   Influenza B by PCR NEGATIVE NEGATIVE Final    Comment: (NOTE) The Xpert Xpress SARS-CoV-2/FLU/RSV plus assay is intended as an aid in the diagnosis of influenza from Nasopharyngeal swab specimens and should not be used as a sole basis  for treatment. Nasal washings and aspirates are unacceptable for Xpert Xpress SARS-CoV-2/FLU/RSV testing.  Fact Sheet for Patients: EntrepreneurPulse.com.au  Fact Sheet for Healthcare Providers: IncredibleEmployment.be  This test is not yet approved or cleared by the [place] FDA and has been authorized for detection and/or diagnosis of SARS-CoV-2 by FDA under an Emergency Use Authorization (EUA). This EUA will remain in effect (meaning this test can be used) for the duration of the COVID-19 declaration under Section 564(b)(1) of the Act, 21 U.S.C. section 360bbb-3(b)(1), unless the authorization is terminated or revoked.  Performed at [facility], [place] [address], [place], [place] [address]              Oswald Hillock   [facility] If 7PM-7AM, please contact night-coverage at [email or web address], Office  [phone]   03/01/2021, 4:45 PM  LOS: 4 days

## 2021-03-01 NOTE — Progress Notes (Signed)
Unfortunately, Donald Wade is not getting any better.  I think the real important lab was the prealbumin of only 8.4.  I have never treated patient with an prealbumin this low with chemotherapy.  When chemotherapy is given, there really is no response but just toxicity.  I think Donald Wade realizes the situation that he is in.  He and I had a long talk this morning.  He says that he is ready to be comfortable.  He does not want chemotherapy.  I told him that I just do not believe that chemotherapy was going to be helpful for him.  He wants to go home.  He wants Hospice to come in.  I think this is very reasonable.  I just hate that he is having melena and hematemesis.  I will try him on Reglan to see if this may help a little bit.  I totally agree that Hospice would be the best way for him at this point.  He just is getting weaker.  I just do not see that he is going to be able to handle any type of chemotherapy.  He is not eating.  He really is not able to do much.  His performance status is, at best, ECOG 3.  I told him that he can go home and if he has problems at home, then he can go to [facility].  I just do not want to place a lot of pressure onto his family.  I would hate to have them be his "nurses" and not be able to be his family and enjoy what ever time he has left.  Donald Wade is at peace with his decision.  Again he just wants to have comfort.  I did not talk to him about how long I thought he had.  However, given his prealbumin less than 10, I suspect that he will not survive more than 4 weeks.  There is no lab work back yet today.  He is having the hematemesis in the melena.  I just do not see how this is going to be stopped.  I know this is been very difficult on his family.  I know that they are with him and support him.  Maybe we can get him home today.  He would like this.  I would make sure that Hospice would see him today or tomorrow, at the latest.  I realize  that he is had a fantastic care up on [facility].   I know the staff has done a tremendous job with him.  I know this is a very difficult and challenging situation.  I feel that at this point, we have to focus on his comfort, respect and dignity.   Lattie Haw, MD  2 Timothy 4:16-18

## 2021-03-01 NOTE — Progress Notes (Signed)
Physical Therapy Discharge Patient Details Name: OBRYAN RADU MRN: [id number] DOB: 1956-07-11 Today's Date: 03/01/2021 Time:  -     Patient discharged from PT services secondary to medical decline -  unfortunately, plans are home with Hospice..   GP     Claretha Cooper 03/01/2021, 7:15 AM [place] Pager [phone] Office [phone]

## 2021-03-01 NOTE — Progress Notes (Addendum)
Manufacturing engineer [facility]) Hospital Liaison note    Addendum: Per palliative notes pt is now an expected hospital death.  Referral will be kept open in case family changes decision on willingness to transport; bed will be released.  Please do not hesitate to reach out to [facility] liaisons with questions or concerns.  Thank you.   Received request from [place] for family interest in [facility].  Chart reviewed and eligibility confirmed.  Called pt's spouse, Donald Wade, to offer a bed for today.   Pt's daughter, Ander Purpura, joined in call to advise pt not stable to transport.  Call ended abruptly.  TOC and Lexine Baton, NP with PMT, made aware.  Bed is still availble for pt pending update on pt condition from care team.  [facility] liaison will stand by.  Thank you for the opportunity to participate in this patient's care.  Domenic Moras, BSN, RN [facility] Liaison (listed on [place] under Hospice/[facility])    [phone] (24h on call)

## 2021-03-02 MED ORDER — SENNOSIDES-DOCUSATE SODIUM 8.6-50 MG PO TABS: 1.0000 | ORAL_TABLET | Freq: Two times a day (BID) | ORAL | Status: DC | PRN

## 2021-03-02 MED ORDER — FENTANYL 50 MCG/HR TD PT72
1.0000 | MEDICATED_PATCH | TRANSDERMAL | Status: DC
  Administered 2021-03-02: 1 via TRANSDERMAL
  Filled 2021-03-02: qty 1

## 2021-03-02 MED ORDER — LORAZEPAM 2 MG/ML IJ SOLN
0.5000 mg | INTRAMUSCULAR | Status: DC | PRN
  Administered 2021-03-02: 0.5 mg via INTRAVENOUS
  Filled 2021-03-02: qty 1

## 2021-03-02 MED ORDER — SUCRALFATE 1 G PO TABS: 1.0000 g | ORAL_TABLET | Freq: Four times a day (QID) | ORAL | Status: DC | PRN

## 2021-03-02 MED ORDER — POLYVINYL ALCOHOL 1.4 % OP SOLN: 1.0000 [drp] | Freq: Four times a day (QID) | OPHTHALMIC | 0 refills | Status: AC | PRN

## 2021-03-02 MED ORDER — HYDROMORPHONE HCL 1 MG/ML IJ SOLN
1.0000 mg | INTRAMUSCULAR | Status: DC | PRN
  Administered 2021-03-02 (×2): 1 mg via INTRAVENOUS
  Filled 2021-03-02 (×2): qty 1

## 2021-03-02 MED ORDER — FENTANYL 50 MCG/HR TD PT72: 1.0000 | MEDICATED_PATCH | TRANSDERMAL | 0 refills | Status: AC

## 2021-03-09 NOTE — Discharge Summary (Signed)
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  ED physician discussed with GI Dr. Therisa Doyne on phone, and apparently GI had nothing additional to add at this time since the bleeding is from underlying gastric cancer which is not amenable to endoscopic therapy.  Hemoglobin was 5.4.  He was transfused PRBC.  Oncology and palliative care were consulted.    Hospital Course:   Acute blood loss anemia -Secondary to upper GI bleed in setting of GE junction adenocarcinoma -GI was consulted by ED provider however GI has nothing additional to add since patient is bleeding from gastric cancer which is not amenable to endoscopic therapy -Today hemoglobin is 6.6 -Transfuse 1 unit PRBC -Oncology and palliative care following -CODE STATUS changed to DNR -Patient was made comfort care only     GE junction adenocarcinoma -Has been having episodes of hematemesis -Continue Zofran as needed -Oncology following -Patient has been adamant on getting 1 cycle of chemotherapy -He has very low prealbumin of 8.4; dropped from 18.0 from last month -Would not be a candidate for chemotherapy -Continue Carafate -Oncology was  consulted, patient made comfort care only   Hypertension -Blood pressure is stable -Metoprolol on hold   Depression -Continue Wellbutrin   Hyponatremia -Sodium is 131 -Likely from poor p.o. intake -We will monitor   Goals of care -Oncology recommends hospice; since he is not a candidate for chemotherapy -Palliative care saw the patient, patient started on morphine as needed -Plan was to be transferred to residential hospice for end-of-life care, however patient expired in the hospital at 1424.     Time: Of [phone]  Signed:  Oswald Hillock  [facility] 03-08-21, 3:56 PM

## 2021-03-09 NOTE — Plan of Care (Signed)
Pt reports feeling bad and states:"I don't want to go through this anymore" pain meds administered on time to prevent discomfort; pt able to sleep; no s/s of acute distress reported or observed; just very depressed; call light within reach and bed at lowest position for safety.

## 2021-03-09 NOTE — TOC Transition Note (Signed)
Transition of Care [facility]) - CM/SW Discharge Note   Patient Details  Name: Donald Wade MRN: [id number] Date of Birth: 09-15-1955  [facility] CM/SW Contact:  Lynnell Catalan, RN Phone Number: 03/21/21, 1:39 PM   Clinical Narrative:    Pt to dc to [facility] today. [facility] contacted for transport. Yellow DNR on chart for transport. RN to call [phone] for report.

## 2021-03-09 NOTE — Progress Notes (Signed)
[facility]  Per discussion with patients spouse, she would still like to assess the situation on how the patient looks when she gets to the hospital today. As of now she would like to keep the patient in the hospital to be comfortable. She stated that the move may cause more pain than good. Spouse will call [facility] back if she changes her mind wanting a [facility]. Please feel free to call me with any questions.   Clementeen Hoof, RN, [facility] [phone]

## 2021-03-09 NOTE — Discharge Summary (Signed)
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)   Iron deficiency anemia due to chronic blood loss   Hematemesis   Discharge Condition: Stable  Diet recommendation: Comfort diet  Filed Weights   02/26/21 1855  Weight: 76.8 kg    History of present illness:  65 year old male with history of gastroesophageal junction adenocarcinoma with metastasis, hypertension, hyperlipidemia, recent hospitalization from 02/20/2021 to 02/22/2021 for GI bleed requiring 3 units PRBC transfusion, s/p EGD which showed diffuse oozing from underlying gastric cancer which was injected with epinephrine and patient was discharged on PPI and Carafate, now presented with hematemesis.  ED physician discussed with GI Dr. Therisa Doyne on phone, and apparently GI had nothing additional to add at this time since the bleeding is from underlying gastric cancer which is not amenable to endoscopic therapy.  Hemoglobin was 5.4.  He was transfused PRBC.  Oncology and palliative care were consulted.  Hospital Course:   Acute blood loss anemia -Secondary to upper GI bleed in setting of GE junction adenocarcinoma -GI was consulted by ED provider however GI has nothing additional to add since patient is bleeding from gastric cancer which is not amenable to endoscopic therapy -Today hemoglobin is 6.6 -Transfuse 1 unit PRBC -Oncology and palliative care following -CODE STATUS changed to DNR -Patient is now comfort care only.     GE junction adenocarcinoma -Has been having episodes of hematemesis -Continue  Zofran as needed -Oncology following -Patient has been adamant on getting 1 cycle of chemotherapy -He has very low prealbumin of 8.4; dropped from 18.0 from last month -Would not be a candidate for chemotherapy -Continue Carafate -Oncology was consulted, patient made comfort care only   Hypertension -Blood pressure is stable -Metoprolol on hold   Depression -Continue Wellbutrin   Hyponatremia -Sodium is 131 -Likely from poor p.o. intake -We will monitor   Goals of care -Oncology recommends hospice; since he is not a candidate for chemotherapy -Palliative care saw the patient, patient started on morphine as needed -Patient will be transferred to residential hospice for end-of-life care  Procedures:   Consultations: Oncology  Discharge Exam: Vitals:   03/01/21 0448 2021-03-07 0621  BP: (!) 146/76 (!) 94/58  Pulse: (!) 106 100  Resp: 16 20  Temp: (!) 97.5 F (36.4 C) (!) 97.4 F (36.3 C)  SpO2: 94% 99%    General: Appears in no acute distress Cardiovascular: S1-S2, regular Respiratory: Clear to auscultation bilaterally  Discharge Instructions   Discharge Instructions     Diet - low sodium heart healthy   Complete by: As directed    Increase activity slowly   Complete by: As directed       Allergies as of 07-Mar-2021       Reactions   Bee Venom Anaphylaxis   Penicillin G Anaphylaxis, Hives, Swelling   Did it involve swelling of the face/tongue/throat, SOB, or low BP? Yes Did it involve sudden or severe rash/hives, skin peeling, or any reaction on the inside of your mouth or nose? Yes Did you need to seek medical attention at a hospital or doctor's office? Yes When  did it last happen?      1990 If all above answers are "NO", may proceed with cephalosporin use.        Medication List     STOP taking these medications    acetaminophen 325 MG tablet Commonly known as: TYLENOL   buPROPion 150 MG 24 hr tablet Commonly known as: WELLBUTRIN XL    EPINEPHrine 0.3 mg/0.3 mL Soaj injection Commonly known as: EPI-PEN   lactose free nutrition Liqd   metoprolol succinate 50 MG 24 hr tablet Commonly known as: Toprol XL   pantoprazole 40 MG tablet Commonly known as: PROTONIX   sucralfate 1 g tablet Commonly known as: CARAFATE       TAKE these medications    fentaNYL 50 MCG/HR Commonly known as: Max 1 patch onto the skin every 3 (three) days. Start taking on: March 05, 2021   ondansetron 8 MG tablet Commonly known as: ZOFRAN Take 8 mg by mouth every 8 (eight) hours as needed for nausea or vomiting.   polyvinyl alcohol 1.4 % ophthalmic solution Commonly known as: LIQUIFILM TEARS Place 1 drop into both eyes 4 (four) times daily as needed for dry eyes.       Allergies  Allergen Reactions   Bee Venom Anaphylaxis   Penicillin G Anaphylaxis, Hives and Swelling    Did it involve swelling of the face/tongue/throat, SOB, or low BP? Yes Did it involve sudden or severe rash/hives, skin peeling, or any reaction on the inside of your mouth or nose? Yes Did you need to seek medical attention at a hospital or doctor's office? Yes When did it last happen?      1990 If all above answers are "NO", may proceed with cephalosporin use.      The results of significant diagnostics from this hospitalization (including imaging, microbiology, ancillary and laboratory) are listed below for reference.    Significant Diagnostic Studies: No results found.  Microbiology: Recent Results (from the past 240 hour(s))  SARS CORONAVIRUS 2 (TAT 6-24 HRS) Nasopharyngeal Nasopharyngeal Swab     Status: None   Collection Time: 02/20/21  3:47 PM   Specimen: Nasopharyngeal Swab  Result Value Ref Range Status   SARS Coronavirus 2 NEGATIVE NEGATIVE Final    Comment: (NOTE) SARS-CoV-2 target nucleic acids are NOT DETECTED.  The SARS-CoV-2 RNA is generally detectable in upper and lower respiratory specimens during the acute phase of  infection. Negative results do not preclude SARS-CoV-2 infection, do not rule out co-infections with other pathogens, and should not be used as the sole basis for treatment or other patient management decisions. Negative results must be combined with clinical observations, patient history, and epidemiological information. The expected result is Negative.  Fact Sheet for Patients: SugarRoll.be  Fact Sheet for Healthcare Providers: https://www.woods-mathews.com/  This test is not yet approved or cleared by the [place] FDA and  has been authorized for detection and/or diagnosis of SARS-CoV-2 by FDA under an Emergency Use Authorization (EUA). This EUA will remain  in effect (meaning this test can be used) for the duration of the COVID-19 declaration under Se ction 564(b)(1) of the Act, 21 U.S.C. section 360bbb-3(b)(1), unless the authorization is terminated or revoked sooner.  Performed at [facility], [place] [address] [place], [place] [address]   Resp Panel by RT-PCR (Flu A&B, Covid) Nasopharyngeal Swab     Status: None   Collection Time: 02/15/2021  5:29 AM   Specimen: Nasopharyngeal Swab; Nasopharyngeal(NP) swabs in vial transport medium  Result Value Ref Range Status   SARS Coronavirus 2 by RT PCR NEGATIVE NEGATIVE Final    Comment: (NOTE) SARS-CoV-2 target nucleic acids are NOT DETECTED.  The SARS-CoV-2 RNA is generally detectable in upper respiratory specimens during the acute phase of infection. The lowest concentration of SARS-CoV-2 viral copies this assay can detect is 138 copies/mL. A negative result does not preclude SARS-Cov-2 infection and should not be used as the sole basis for treatment or other patient management decisions. A negative result may occur with  improper specimen collection/handling, submission of specimen other than nasopharyngeal swab, presence of viral mutation(s) within the areas targeted by this  assay, and inadequate number of viral copies(<138 copies/mL). A negative result must be combined with clinical observations, patient history, and epidemiological information. The expected result is Negative.  Fact Sheet for Patients:  EntrepreneurPulse.com.au  Fact Sheet for Healthcare Providers:  IncredibleEmployment.be  This test is no t yet approved or cleared by the [place] FDA and  has been authorized for detection and/or diagnosis of SARS-CoV-2 by FDA under an Emergency Use Authorization (EUA). This EUA will remain  in effect (meaning this test can be used) for the duration of the COVID-19 declaration under Section 564(b)(1) of the Act, 21 U.S.C.section 360bbb-3(b)(1), unless the authorization is terminated  or revoked sooner.       Influenza A by PCR NEGATIVE NEGATIVE Final   Influenza B by PCR NEGATIVE NEGATIVE Final    Comment: (NOTE) The Xpert Xpress SARS-CoV-2/FLU/RSV plus assay is intended as an aid in the diagnosis of influenza from Nasopharyngeal swab specimens and should not be used as a sole basis for treatment. Nasal washings and aspirates are unacceptable for Xpert Xpress SARS-CoV-2/FLU/RSV testing.  Fact Sheet for Patients: EntrepreneurPulse.com.au  Fact Sheet for Healthcare Providers: IncredibleEmployment.be  This test is not yet approved or cleared by the [place] FDA and has been authorized for detection and/or diagnosis of SARS-CoV-2 by FDA under an Emergency Use Authorization (EUA). This EUA will remain in effect (meaning this test can be used) for the duration of the COVID-19 declaration under Section 564(b)(1) of the Act, 21 U.S.C. section 360bbb-3(b)(1), unless the authorization is terminated or revoked.  Performed at [facility], [place] [address], [place], [place] [address]      Labs: Basic Metabolic Panel: Recent Labs  Lab 02/26/21 0519  02/27/21 0626 02/28/21 0530 02/28/21 0817 03/01/21 0442  NA 130* 131* 131* 131* 130*  K 3.7 3.8 4.0 4.2 4.3  CL 101 102 102 102 100  CO2 23 21* 21* 21* 18*  GLUCOSE 109* 109* 120* 118* 132*  BUN 40* 48* 58* 59* 77*  CREATININE 1.56* 1.68* 1.98* 1.98* 2.43*  CALCIUM 7.3* 7.8* 7.4* 7.5* 7.2*  MG  --  2.1 2.2  --   --    Liver Function Tests: Recent Labs  Lab 03/03/2021 0449 02/27/21 0626 02/28/21 0817 03/01/21 0442  AST 39 41 46* 53*  ALT 12 12 11 14   ALKPHOS 239* 258* 281* 298*  BILITOT 0.9 0.8 1.2 1.4*  PROT 4.5* 4.5* 4.4* 4.3*  ALBUMIN 2.2* 2.2* 2.2* 2.1*   No results for input(s): LIPASE, AMYLASE in the last 168 hours. No results for input(s): AMMONIA in the last 168 hours. CBC: Recent Labs  Lab 02/26/21 0519 02/26/21 1220 02/27/21 0626 02/28/21 0530 02/28/21 0817 03/01/21 0442  WBC 8.4  --  8.0 8.3 8.2 11.6*  NEUTROABS  --   --  6.1 6.2 6.3 9.0*  HGB 6.9*  8.2* 6.9* 7.0* 6.6* 6.7*  HCT 20.4* 24.0* 20.6* 20.7* 19.6* 19.9*  MCV 88.7  --  88.4 89.6 89.5 91.3  PLT 84*  --  69* 62* 62* 46*   Cardiac Enzymes: No results for input(s): CKTOTAL, CKMB, CKMBINDEX, TROPONINI in the last 168 hours. BNP: BNP (last 3 results) No results for input(s): BNP in the last 8760 hours.  ProBNP (last 3 results) No results for input(s): PROBNP in the last 8760 hours.  CBG: No results for input(s): GLUCAP in the last 168 hours.     Signed:  Oswald Hillock MD.  [facility] March 28, 2021, 12:07 PM

## 2021-03-09 NOTE — Progress Notes (Signed)
   Chart reviewed. Updates received from RN.   [id number]: Donald Wade continues to have increased discomfort. Pain seems to be more frequent. Dilaudid now ordered and seems to be effective. Continues to be weaker today and less interactive. Education provided on comfort and continued symptom management.   1135: Received notification family is now in agreement with [employer].     1445:  Patient pending to transfer to [facility] however, passed away prior to transport. No family at the bedside during my visit.   Spoke with wife via phone and provided support and condolences. She expressed appreciation of support.     Time Total: 40 min.   Visit consisted of counseling and education dealing with the complex and emotionally intense issues of symptom management and palliative care in the setting of serious and potentially life-threatening illness.Greater than 50%  of this time was spent counseling and coordinating care related to the above assessment and plan.  Alda Lea, AGPCNP-BC  Palliative Medicine Team [phone]

## 2021-03-09 NOTE — Progress Notes (Signed)
Provided grief support to patient's son and accompanied him into the room to see his dad.  [employer], bcc Pager, [phone] 6:01 PM

## 2021-03-09 NOTE — Progress Notes (Signed)
I provided listening presence to Bob's daughter who just wants her father to be comfortable and feels that he should not be moved at this point because moving him would be too distressing to him.  She is very sad about not being able to have more family at bedside but feels that it is too late to make changes at this point.  Her brother arrived as we were talking and she was planning to go home to be with her mother.    I spoke with Mikki Santee briefly who stated that he is "desperate" to "go on."  He is not in pain, but is feeling some discomfort and some anxiety.  He made a plan with his nurse to take something for his anxiety.  [place], [place] Pager, [phone] 9:18 AM

## 2021-03-09 NOTE — Progress Notes (Signed)
Chaplain engaged in visit with Donald Wade and his son.  Donald Wade immediately stated how he was ready to die and that he was in a lot of pain.  He expressed not wanting to suffer in the time that he has left.  Chaplain could assess that Donald Wade possibly wanted to die sooner or quicker than he has.  He expressed wanting to transition whether it be to [facility] or another floor. He appeared very agitated in the bed and wanted to be pulled up or moved.  Every move made Donald Wade feel lightheaded.  Chaplain put a cold cloth on his head for comfort.  Chaplain let nurse know and she was able to meet his needs for pain management and agitation.  Chaplain also spoke with nurse about possibly talking to Palliative Care again so that they could reassess his current needs for pain management.   Chaplain offered support, listening and presence.  Chaplain is available to provide support as needed.     2021/03/15 1000  Clinical Encounter Type  Visited With Patient and family together  Visit Type Follow-up;Spiritual support

## 2021-03-09 NOTE — Progress Notes (Signed)
   2021/03/26 1400  Attending [place]  Attending Physician Notified Y  Attending Physician (First and Last Name) Eleonore Chiquito  Post Mortem Checklist  Date of Death March 26, 2021  Time of Death 31  Pronounced By RN  Next of kin notified Yes  Name of next of kin notified of death Daviyon Widmayer  Contact Person's Relationship to Patient Spouse  Contact Person's Phone Number [phone]  Contact Person's address same address as pt  Was the patient a No Code Blue or a Limited Code Blue? Yes  Did the patient die unattended? Yes  Patient restrained? Not applicable  Height 6' (3.546 m)  Weight 76.8 kg  Body preparation complete N  [employer] (previously known as [employer] techn[employer])  Notification Date 2021-03-26  Notification Time [place]  [employer] Number [phone]  Is patient a potential donor? Other (Comment) (ref [id number]  kiran lavu  potential eye donor)  Donation Type Eyes  Eye prep completed Yes  Patient and [place] Returned  Patient belongings from bedside/safe returned  Yes  Valuables returned to? [name] (spouse)  Dermatherapy linen/gowns NOT sent with patient or transporter Not applicable  Dead on Arrival (Emergency Department)  Patient dead on arrival? No  Notifications  Patient Placement notified that Post Mortem checklist is complete Yes  Patient Placement notified body transferred Transported to [place]  Is this a medical examiner's case? [facility] home name/address/phone # [place] [phone]  Planned location of pickup [place]

## 2021-03-09 NOTE — Progress Notes (Signed)
Donald Wade is still in the hospital.  He is now comfort care.  Apparently, there was works to try to move him over to [employer].  For some reason, this was not done.  I think [place] would be ideal for him.  Hopefully, this can still be done.  He is getting morphine now.  He gets IV morphine every few hours.  I think a Duragesic patch would not be a bad idea for him.  He is still incredibly weak.  I do not think that labs are being checked on him.  I can agree with this.  I think that this is not going to affect his prognosis.  Comfort care clearly is the way to go.  Again I would like to hope that [facility] will still be considered.  I think this would really help out his family.  He is really not eating much.  There is still some bleeding.  He is not having any vomiting.  His vital signs look stable.  Temperature is 97.4.  Pulse 100.  Blood pressure 94/58.  His lungs sound clear.  Cardiac exam tachycardic but regular.  Abdomen is slightly distended.  Bowel sounds are decreased.  There is no guarding.  Donald Wade is end-stage at this point.  Again he is comfort care.  Hopefully, a Duragesic patch will help.  I see that he has been ordered Roxanol.  I think this is a very good idea.  It would be nice to try to limit medications that he does not need.  I will think his prognosis is likely to be about a week or so.  This could always change.  I do appreciate the compassionate care that he is getting from all staff up on 6 E.  Lattie Haw, MD  Darlyn Chamber 26:14

## 2021-03-09 DEATH — deceased

## 2021-03-13 ENCOUNTER — Inpatient Hospital Stay: Payer: No Typology Code available for payment source

## 2021-03-13 ENCOUNTER — Inpatient Hospital Stay: Payer: No Typology Code available for payment source | Admitting: Physician Assistant

## 2021-03-14 ENCOUNTER — Inpatient Hospital Stay: Payer: No Typology Code available for payment source

## 2021-03-27 ENCOUNTER — Ambulatory Visit: Payer: No Typology Code available for payment source

## 2021-03-27 ENCOUNTER — Other Ambulatory Visit: Payer: No Typology Code available for payment source

## 2021-04-10 ENCOUNTER — Ambulatory Visit: Payer: No Typology Code available for payment source

## 2021-04-10 ENCOUNTER — Other Ambulatory Visit: Payer: No Typology Code available for payment source

## 2021-04-24 ENCOUNTER — Ambulatory Visit: Payer: No Typology Code available for payment source

## 2021-04-24 ENCOUNTER — Other Ambulatory Visit: Payer: No Typology Code available for payment source

## 2022-06-19 IMAGING — CT CT CHEST W/ CM
2 of 5 series · 14 of 46 positions shown, 16 images · IV contrast (omnipaque)
Comparison: CT abdomen pelvis dated 01/03/2004.

CLINICAL DATA: 64-year-old male with GI cancer staging.

EXAM:
CT CHEST, ABDOMEN, AND PELVIS WITH CONTRAST
TECHNIQUE: Multidetector CT imaging of the chest, abdomen and pelvis was
performed following the standard protocol during bolus
administration of intravenous contrast.
CONTRAST:  100mL OMNIPAQUE IOHEXOL 300 MG/ML  SOLN

[Series 3: cap with · axial · 0.86mm/px · z∈[+774,+1364]mm · 11 of 142 slices shown, 13 images]
[im 12/142  soft-tissue]
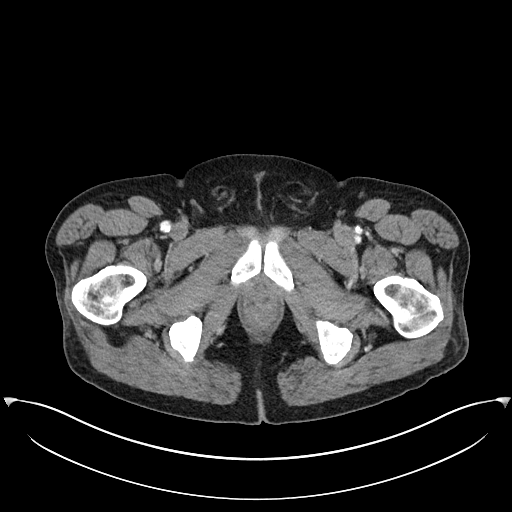
[im 12/142  bone]
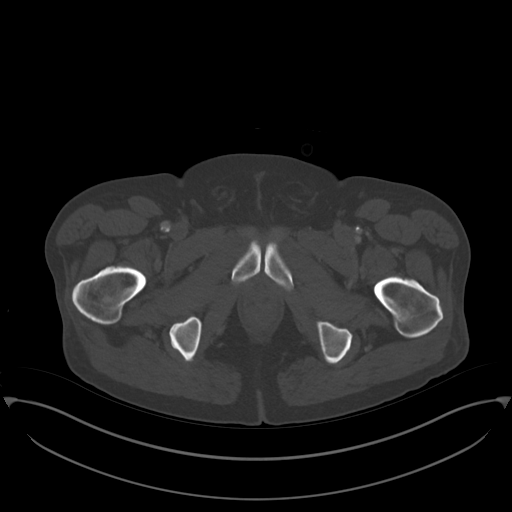
[im 24/142  soft-tissue]
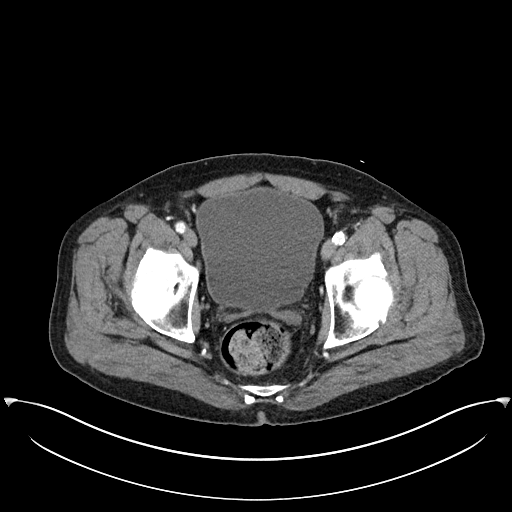
[im 36/142  soft-tissue]
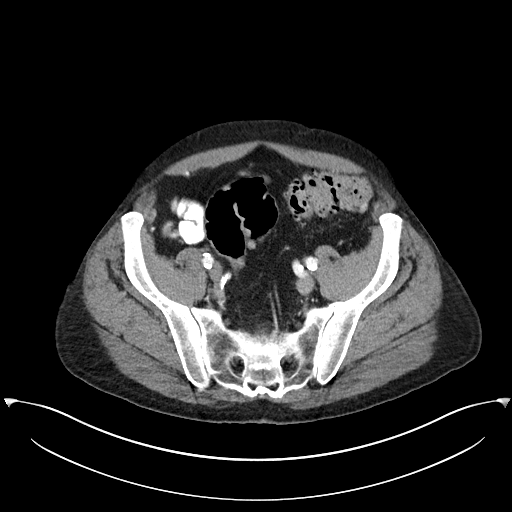
[im 48/142  soft-tissue]
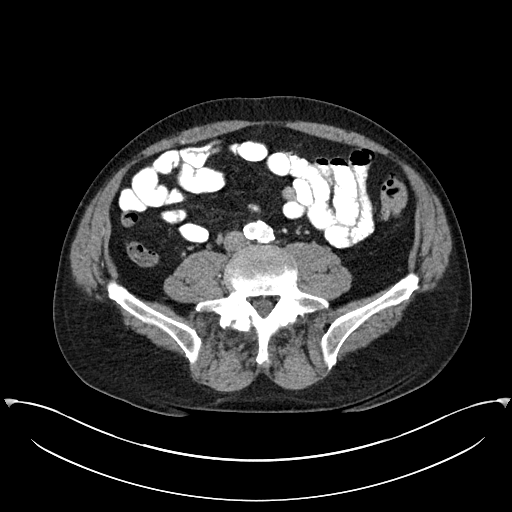
[im 59/142  soft-tissue]
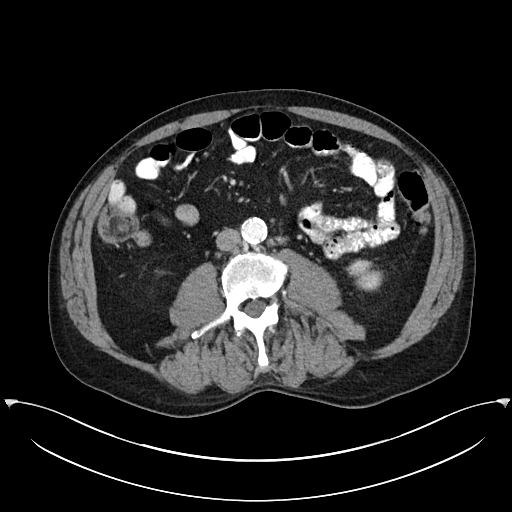
[im 71/142  soft-tissue]
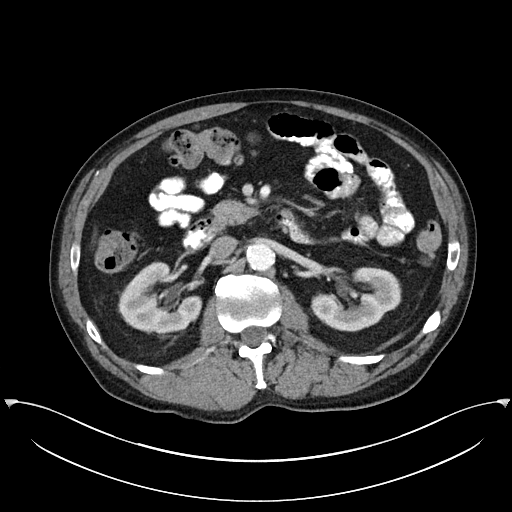
[im 83/142  soft-tissue]
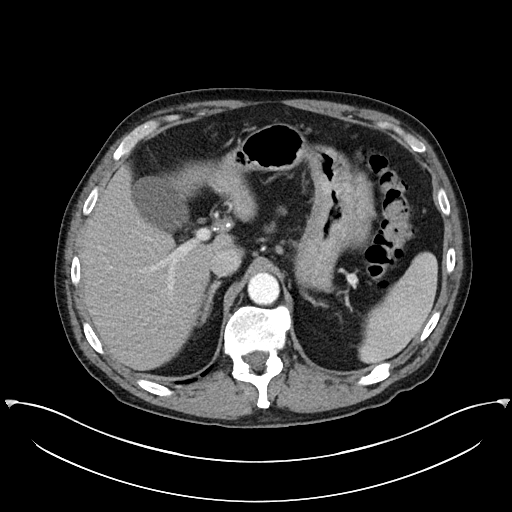
[im 95/142  soft-tissue]
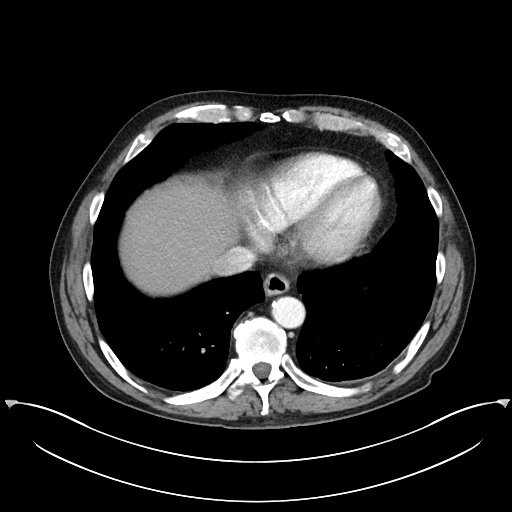
[im 106/142  soft-tissue]
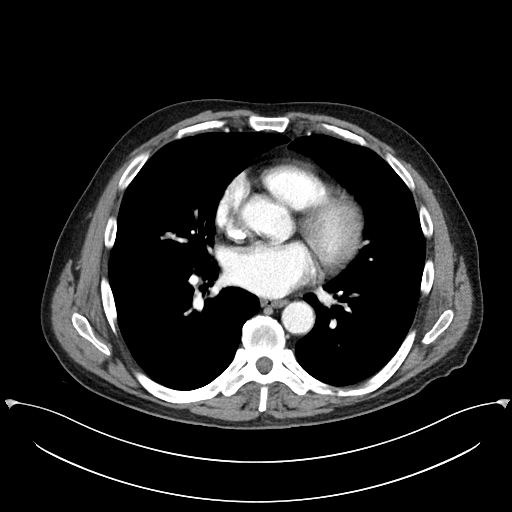
[im 106/142  bone]
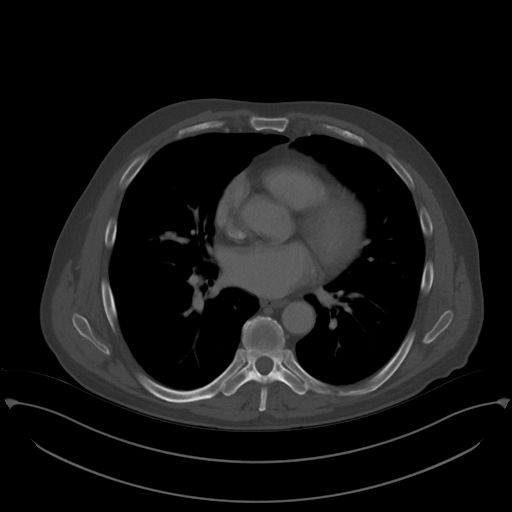
[im 118/142  soft-tissue]
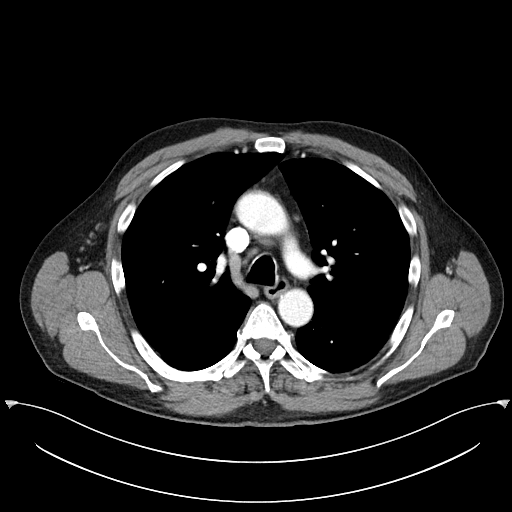
[im 130/142  soft-tissue]
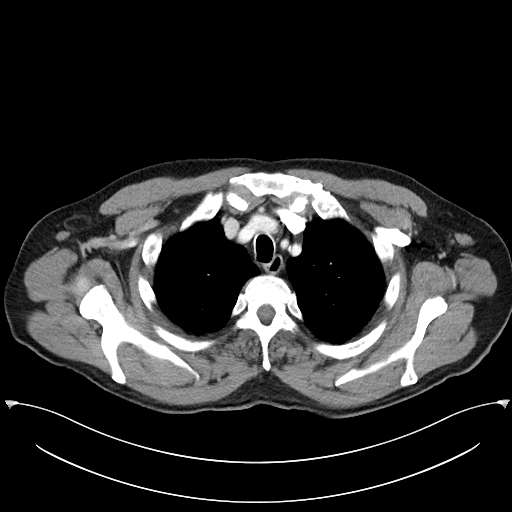

[Series 6: cor · coronal · 0.97mm/px · 3 of 98 slices shown]
[im 33/98  soft-tissue]
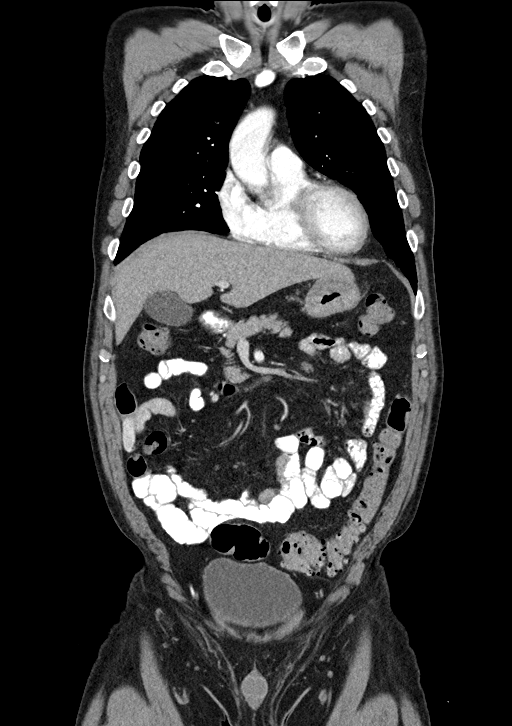
[im 44/98  soft-tissue]
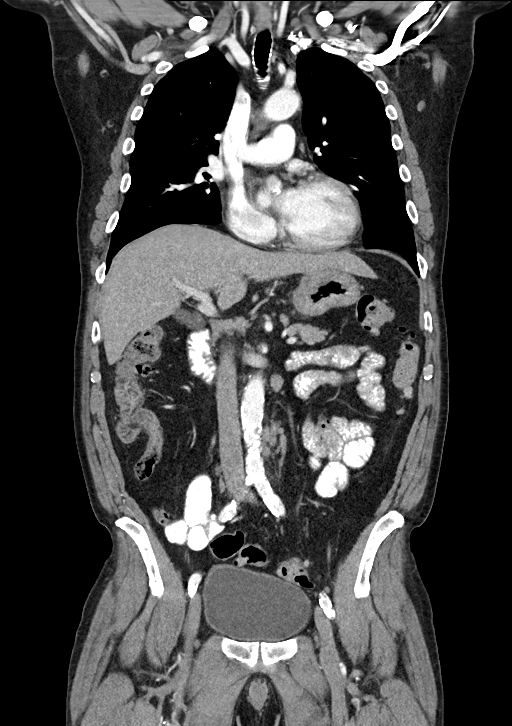
[im 54/98  soft-tissue]
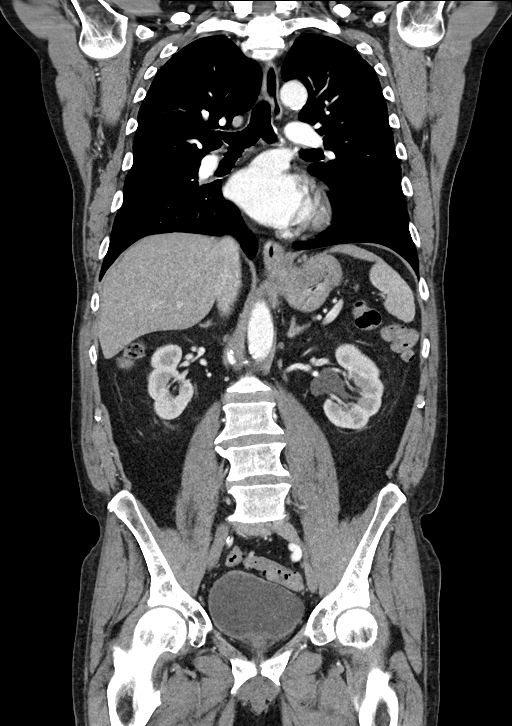

[14 of 46 positions shown; findings below may reference images not displayed]

FINDINGS: CT CHEST FINDINGS

Cardiovascular: There is no cardiomegaly or pericardial effusion.
There is coronary vascular calcification. Mild atherosclerotic
calcification of the thoracic aorta. No aneurysmal dilatation or
dissection. The origins of the great vessels of the aortic arch
appear patent as visualized. The central pulmonary arteries appear
patent.

Mediastinum/Nodes: No hilar or mediastinal adenopathy. Top-normal
right hilar lymph node measures 9 mm. The esophagus and the thyroid
gland are grossly unremarkable. No mediastinal fluid collection.

Lungs/Pleura: Minimal left lung base dependent atelectasis. No focal
consolidation, pleural effusion, pneumothorax. The central airways
are patent.

Musculoskeletal: Old healed left posterior rib fractures. No acute
osseous pathology.

CT ABDOMEN PELVIS FINDINGS

No intra-abdominal free air or free fluid.

Hepatobiliary: No focal liver abnormality is seen. No gallstones,
gallbladder wall thickening, or biliary dilatation.

Pancreas: Unremarkable. No pancreatic ductal dilatation or
surrounding inflammatory changes.

Spleen: Normal in size without focal abnormality.

Adrenals/Urinary Tract: The adrenal glands unremarkable. There is no
hydronephrosis on either side. There is symmetric enhancement and
excretion of contrast by both kidneys. The visualized ureters and
urinary bladder appear unremarkable.

Stomach/Bowel: There is sigmoid diverticulosis and scattered colonic
diverticula without active inflammatory changes. There is thickened
appearance of the wall of the stomach adjacent to the
gastroesophageal junction as well as thickening of the proximal
gastric wall. No evidence of gastric outlet obstruction. There is no
bowel obstruction or active inflammation. Appendectomy.

Vascular/Lymphatic: Advanced aortoiliac atherosclerotic disease. The
IVC is unremarkable. No portal venous gas. Several small but mildly
rounded gastrohepatic lymph nodes concerning for early nodal
metastatic disease. Several additional mildly enlarged
retroperitoneal lymph nodes along the abdominal aorta and IVC noted.
A mildly enlarged lymph node anterior to the aorta measures 10 mm in
short axis (91/3).

Reproductive: The prostate and seminal vesicles are grossly
unremarkable.

Other: None

Musculoskeletal: Degenerative changes of the spine. No acute osseous
pathology.
IMPRESSION: 1. Thickened appearance of the wall of the stomach adjacent to the
gastroesophageal junction as well as proximal gastric wall
concerning for infiltrative neoplasm.
2. Rounded gastrohepatic lymph nodes as well as mildly enlarged
retroperitoneal lymph nodes concerning for early nodal metastatic
disease.
3. No evidence of metastatic disease in the chest.
4. Colonic diverticulosis.
5. Aortic Atherosclerosis ([id number]).
